# Patient Record
Sex: Male | Born: 1955 | Race: White | Hispanic: No | Marital: Single | Smoking: Never smoker
Health system: Southern US, Community
[De-identification: ages and names within clinical notes are randomized; demographics above are authoritative.]

## PROBLEM LIST (undated history)

## (undated) DIAGNOSIS — I10 Essential (primary) hypertension: Secondary | ICD-10-CM

## (undated) DIAGNOSIS — I514 Myocarditis, unspecified: Secondary | ICD-10-CM

## (undated) DIAGNOSIS — E119 Type 2 diabetes mellitus without complications: Secondary | ICD-10-CM

## (undated) DIAGNOSIS — J45909 Unspecified asthma, uncomplicated: Secondary | ICD-10-CM

## (undated) HISTORY — DX: Essential (primary) hypertension: I10

## (undated) HISTORY — DX: Unspecified asthma, uncomplicated: J45.909

---

## 2009-01-29 ENCOUNTER — Emergency Department (HOSPITAL_COMMUNITY): Admission: EM | Admit: 2009-01-29 | Discharge: 2009-01-29 | Payer: Self-pay | Admitting: Emergency Medicine

## 2013-01-13 ENCOUNTER — Ambulatory Visit (INDEPENDENT_AMBULATORY_CARE_PROVIDER_SITE_OTHER): Payer: BC Managed Care – PPO | Admitting: Family Medicine

## 2013-01-13 VITALS — BP 166/88 | HR 57 | Temp 98.1°F | Resp 18 | Ht 72.25 in | Wt 267.0 lb

## 2013-01-13 DIAGNOSIS — R040 Epistaxis: Secondary | ICD-10-CM

## 2013-01-13 DIAGNOSIS — Z91148 Patient's other noncompliance with medication regimen for other reason: Secondary | ICD-10-CM

## 2013-01-13 DIAGNOSIS — Z9114 Patient's other noncompliance with medication regimen: Secondary | ICD-10-CM

## 2013-01-13 DIAGNOSIS — I10 Essential (primary) hypertension: Secondary | ICD-10-CM

## 2013-01-13 MED ORDER — AMLODIPINE BESYLATE 10 MG PO TABS: 10.0000 mg | ORAL_TABLET | Freq: Every day | ORAL | Status: DC

## 2013-01-13 NOTE — Patient Instructions (Addendum)
Make an appt for a full physical with fasting blood work at your next visit.  DASH Diet The DASH diet stands for "Dietary Approaches to Stop Hypertension." It is a healthy eating plan that has been shown to reduce high blood pressure (hypertension) in as little as 14 days, while also possibly providing other significant health benefits. These other health benefits include reducing the risk of breast cancer after menopause and reducing the risk of type 2 diabetes, heart disease, colon cancer, and stroke. Health benefits also include weight loss and slowing kidney failure in patients with chronic kidney disease.  DIET GUIDELINES  Limit salt (sodium). Your diet should contain less than 1500 mg of sodium daily.  Limit refined or processed carbohydrates. Your diet should include mostly whole grains. Desserts and added sugars should be used sparingly.  Include small amounts of heart-healthy fats. These types of fats include nuts, oils, and tub margarine. Limit saturated and trans fats. These fats have been shown to be harmful in the body. CHOOSING FOODS  The following food groups are based on a 2000 calorie diet. See your Registered Dietitian for individual calorie needs. Grains and Grain Products (6 to 8 servings daily)  Eat More Often: Whole-wheat bread, brown rice, whole-grain or wheat pasta, quinoa, popcorn without added fat or salt (air popped).  Eat Less Often: White bread, white pasta, white rice, cornbread. Vegetables (4 to 5 servings daily)  Eat More Often: Fresh, frozen, and canned vegetables. Vegetables may be raw, steamed, roasted, or grilled with a minimal amount of fat.  Eat Less Often/Avoid: Creamed or fried vegetables. Vegetables in a cheese sauce. Fruit (4 to 5 servings daily)  Eat More Often: All fresh, canned (in natural juice), or frozen fruits. Dried fruits without added sugar. One hundred percent fruit juice ( cup [237 mL] daily).  Eat Less Often: Dried fruits with added  sugar. Canned fruit in light or heavy syrup. [employer], Fish, and Poultry (2 servings or less daily. One serving is 3 to 4 oz [85-114 g]).  Eat More Often: Ninety percent or leaner ground beef, tenderloin, sirloin. Round cuts of beef, chicken breast, [place] breast. All fish. Grill, bake, or broil your meat. Nothing should be fried.  Eat Less Often/Avoid: Fatty cuts of meat, [place], or chicken leg, thigh, or wing. Fried cuts of meat or fish. Dairy (2 to 3 servings)  Eat More Often: Low-fat or fat-free milk, low-fat plain or light yogurt, reduced-fat or part-skim cheese.  Eat Less Often/Avoid: Milk (whole, 2%).Whole milk yogurt. Full-fat cheeses. Nuts, Seeds, and Legumes (4 to 5 servings per week)  Eat More Often: All without added salt.  Eat Less Often/Avoid: Salted nuts and seeds, canned beans with added salt. Fats and Sweets (limited)  Eat More Often: Vegetable oils, tub margarines without trans fats, sugar-free gelatin. Mayonnaise and salad dressings.  Eat Less Often/Avoid: Coconut oils, palm oils, butter, stick margarine, cream, half and half, cookies, candy, pie. FOR MORE INFORMATION The Dash Diet Eating Plan: www.dashdiet.org Document Released: 09/13/2011 Document Revised: 12/17/2011 Document Reviewed: 09/13/2011 [facility] Patient Information 2013 [place], [place].

## 2013-01-13 NOTE — Progress Notes (Signed)
Subjective:    Patient ID: Daniel [name], male    DOB: December 09, 1955, 57 y.o.   MRN: [id number] Chief Complaint  Patient presents with  . Medication Refill  . Epistaxis    HPI  Has not been on his bp med for several years. Would only use it if he got nose bleeds and hasn't gotten in a long time so stopped using it.  Also, he states his rxs always expire after a year so he assumed we didn't need to keep him on the meds or we would have kept refilling them. Does not check bp outside of office.  Does not remember having labs done at all.  Not watching diet or getting exercise. Works 2nd shift - from 5 p.m. to 3 a.m. So last ate about 5 a.m. - 7 hrs ago.  He is in a hurry and wants to leave clinic asap so he can go home and gets some sleep before he has to go back to work. Nose bleed out of his right nare woke him from sleep this a.m. So he figured he better get back on his bp meds. This has been his only sx.  It stopped easily with pressure and has not recurred.  Past Medical History  Diagnosis Date  . Asthma    No current outpatient prescriptions on file prior to visit.   No current facility-administered medications on file prior to visit.   No Known Allergies   Review of Systems  Constitutional: Negative for fever and chills.  HENT: Positive for nosebleeds. Negative for congestion and rhinorrhea.   Eyes: Negative for visual disturbance.  Respiratory: Negative for shortness of breath.   Cardiovascular: Negative for chest pain and leg swelling.  Neurological: Negative for dizziness, syncope, facial asymmetry, weakness, light-headedness and headaches.      BP 166/88  Pulse 57  Temp(Src) 98.1 F (36.7 C) (Oral)  Resp 18  Ht 6' 0.25" (1.835 m)  Wt 267 lb (121.11 kg)  BMI 35.97 kg/m2  SpO2 97% Objective:   Physical Exam  Constitutional: He is oriented to person, place, and time. He appears well-developed and well-nourished. No distress.  HENT:  Head: Normocephalic and atraumatic.   Nose: No mucosal edema, rhinorrhea, nose lacerations, sinus tenderness, nasal deformity, septal deviation or nasal septal hematoma.  Right nare crusted with old dried blood externally but no source identified on exam  Eyes: Conjunctivae are normal. Pupils are equal, round, and reactive to light. No scleral icterus.  Neck: Normal range of motion. Neck supple. No thyromegaly present.  Cardiovascular: Normal rate, regular rhythm, normal heart sounds and intact distal pulses.   Pulmonary/Chest: Effort normal and breath sounds normal. No respiratory distress.  Musculoskeletal: He exhibits no edema.  Lymphadenopathy:    He has no cervical adenopathy.  Neurological: He is alert and oriented to person, place, and time.  Skin: Skin is warm and dry. He is not diaphoretic.  Psychiatric: His affect is blunt.      Assessment & Plan:  Essential hypertension, benign - Tried to explain to pt the importance of BP control and medication compliance even when assymptomatic - to prevent development of end-organ failure. Also encouraged pt to obtain labs today such as cmp and lipid panel to monitor kidneys, electrolytes, risk factors, etc but pt declined - does not like needles. I did not want to rx acei or diuretic to pt w/o baseline labs or lab monitoring so pt elected to change bp med to one that did not require lab  monitoring.  Noncompliance with medication regimen - pt encouraged to RTC for recheck in several wks and tried to explain why we need to see him yearly for bp med refills.  Rec CPE at next visit.  Epistaxis - resolved.  Meds ordered this encounter  Medications  . DISCONTD: lisinopril-hydrochlorothiazide (PRINZIDE,ZESTORETIC) 10-12.5 MG per tablet    Sig: Take 1 tablet by mouth daily.  Marland Kitchen amLODipine (NORVASC) 10 MG tablet    Sig: Take 1 tablet (10 mg total) by mouth daily.    Dispense:  90 tablet    Refill:  3

## 2013-01-15 ENCOUNTER — Encounter (HOSPITAL_COMMUNITY): Payer: Self-pay | Admitting: Family Medicine

## 2013-01-15 ENCOUNTER — Emergency Department (HOSPITAL_COMMUNITY)
Admission: EM | Admit: 2013-01-15 | Discharge: 2013-01-15 | Disposition: A | Discharge status: Home / Self Care | Payer: BC Managed Care – PPO | Attending: Emergency Medicine | Admitting: Emergency Medicine

## 2013-01-15 DIAGNOSIS — Z79899 Other long term (current) drug therapy: Secondary | ICD-10-CM | POA: Insufficient documentation

## 2013-01-15 DIAGNOSIS — J45909 Unspecified asthma, uncomplicated: Secondary | ICD-10-CM | POA: Insufficient documentation

## 2013-01-15 DIAGNOSIS — I10 Essential (primary) hypertension: Secondary | ICD-10-CM | POA: Insufficient documentation

## 2013-01-15 DIAGNOSIS — R040 Epistaxis: Secondary | ICD-10-CM | POA: Insufficient documentation

## 2013-01-15 NOTE — ED Provider Notes (Signed)
History     CSN: [id number]  Arrival date & time 01/15/13  4098   First MD Initiated Contact with Patient 01/15/13 [phone]      Chief Complaint  Patient presents with  . Epistaxis   HPI  History provided by the patient. Patient is a 57 year old male with history of asthma and hypertension who presents for concerns for recurrent right nosebleed. Patient reports being at work for the night shift and around 11 PM developed slight right nosebleed. He felt the bleeding was from the front of his nose and he applied pressure for several minutes stopping the bleeding. 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 He can help pressure and put a small piece of tissue in his nose. Bleeding again was controlled the patient was concerned because he was recently started on blood pressure medications 3 days ago. He has been taking this as prescribed for 2 doses. He has not had any other symptoms or side effects. Denies any runny nose or congestion. No sneezing or coughing. No other aggravating or alleviating factors. No other associated symptoms.    Past Medical History  Diagnosis Date  . Asthma     History reviewed. No pertinent past surgical history.  Family History  Problem Relation Age of Onset  . Cancer Mother   . Heart attack Father   . Cancer Paternal Aunt     History  Substance Use Topics  . Smoking status: Never Smoker   . Smokeless tobacco: Not on file  . Alcohol Use: No      Review of Systems  HENT: Negative for congestion, rhinorrhea and sneezing.   Respiratory: Negative for cough.   All other systems reviewed and are negative.    Allergies  Review of patient's allergies indicates no known allergies.  Home Medications   Current Outpatient Rx  Name  Route  Sig  Dispense  Refill  . amLODipine (NORVASC) 10 MG tablet   Oral   Take 1 tablet (10 mg total) by mouth daily.   90 tablet   3     BP 146/86  Pulse 67   Temp(Src) 98.1 F (36.7 C) (Oral)  Resp 18  SpO2 100%  Physical Exam  Nursing note and vitals reviewed. Constitutional: He is oriented to person, place, and time. He appears well-developed and well-nourished. No distress.  HENT:  Head: Normocephalic.  Small area of dry blood and clot to the very anterior right nasal septum. Bleeding controlled.  Eyes: Conjunctivae are normal.  Cardiovascular: Normal rate and regular rhythm.   Pulmonary/Chest: Effort normal and breath sounds normal.  Musculoskeletal: Normal range of motion.  Neurological: He is alert and oriented to person, place, and time.  Skin: Skin is warm.  Psychiatric: He has a normal mood and affect. His behavior is normal.    ED Course  Procedures    1. Epistaxis       MDM  4:50 AM patient seen and evaluated. Patient appears well. No active nosebleed.        Angus Seller, PA-C 01/15/13 [phone]

## 2013-01-15 NOTE — ED Notes (Signed)
Patient states he had a nosebleed on Tuesday morning and was seen at [facility] and was given medication for is blood pressure. Tonight patient had a nosebleed at 2300 and at 1am. No active bleeding at this time; patient has tissue inserted into right nare.

## 2013-01-15 NOTE — ED Provider Notes (Signed)
Medical screening examination/treatment/procedure(s) were performed by non-physician practitioner and as supervising physician I was immediately available for consultation/collaboration.  John-Adam Sherissa Tenenbaum, M.D.   John-Adam Jakobi Thetford, MD 01/15/13 0752 

## 2013-01-15 NOTE — ED Notes (Signed)
PA at bedside.

## 2013-01-16 ENCOUNTER — Ambulatory Visit (INDEPENDENT_AMBULATORY_CARE_PROVIDER_SITE_OTHER): Payer: BC Managed Care – PPO | Admitting: Family Medicine

## 2013-01-16 VITALS — BP 142/84 | HR 80 | Temp 98.9°F | Resp 17 | Ht 71.0 in | Wt 266.0 lb

## 2013-01-16 DIAGNOSIS — R040 Epistaxis: Secondary | ICD-10-CM

## 2013-01-16 NOTE — Progress Notes (Signed)
  [facility]:  Office Visit  Chief Complaint:  Chief Complaint  Patient presents with  . Epistaxis    HPI: Daniel [name] is a 57 y.o. male who complains of  Recurrent epistaxis. First nose bleed was in bed asleep. 2nd and third time he was working on a [employer]. He had one earlier today but each time it is resolved by the timehe gets into the clinic. He denies any dizziness. No clot formation. No NSAID use. No blood thinners. He has had no fevers or chills. He has tried holding his nose on one side to stop the bleeding and putting a tissue in his nose.   Past Medical History  Diagnosis Date  . Asthma    History reviewed. No pertinent past surgical history. History   Social History  . Marital Status: Unknown    Spouse Name: N/A    Number of Children: N/A  . Years of Education: N/A   Social History Main Topics  . Smoking status: Never Smoker   . Smokeless tobacco: None  . Alcohol Use: No  . Drug Use: No  . Sexually Active: No   Other Topics Concern  . None   Social History Narrative  . None   Family History  Problem Relation Age of Onset  . Cancer Mother   . Heart attack Father   . Cancer Paternal Aunt    No Known Allergies Prior to Admission medications   Medication Sig Start Date End Date Taking? Authorizing Provider  amLODipine (NORVASC) 10 MG tablet Take 1 tablet (10 mg total) by mouth daily. 01/13/13  Yes Sherren Mocha, MD     ROS: The patient denies fevers, chills, night sweats, unintentional weight loss, chest pain, palpitations, wheezing, dyspnea on exertion, nausea, vomiting, abdominal pain, dysuria, hematuria, melena, numbness, weakness, or tingling.   All other systems have been reviewed and were otherwise negative with the exception of those mentioned in the HPI and as above.    PHYSICAL EXAM: Filed Vitals:   01/16/13 1744  BP: 142/84  Pulse: 80  Temp: 98.9 F (37.2 C)  Resp: 17   Filed Vitals:   01/16/13 1744  Height: 5\' 11"   (1.803 m)  Weight: 266 lb (120.657 kg)   Body mass index is 37.12 kg/(m^2).  General: Alert, no acute distress HEENT:  Normocephalic, atraumatic, oropharynx patent. No active bleeding, some broken capillaries on left nasal mucosa, greater in left  than right Cardiovascular:  Regular rate and rhythm, no rubs murmurs or gallops.  No Carotid bruits, radial pulse intact. No pedal edema.  Respiratory: Clear to auscultation bilaterally.  No wheezes, rales, or rhonchi.  No cyanosis, no use of accessory musculature GI: No organomegaly, abdomen is soft and non-tender, positive bowel sounds.  No masses. Skin: No rashes. Neurologic: Facial musculature symmetric. Psychiatric: Patient is appropriate throughout our interaction. Lymphatic: No cervical lymphadenopathy Musculoskeletal: Gait intact.   LABS: No results found for this or any previous visit.   EKG/XRAY:   Primary read interpreted by Dr. Conley Rolls at [facility].   ASSESSMENT/PLAN: Encounter Diagnosis  Name Primary?  . Epistaxis Yes   No active bleeding, some broken capillaries on left nasal mucosa, greater in left  than right Advise on how to pinch his nose nad also to put cold compresses on it.  Vasoline in nasal mucosa to keep moisture and prevent scabbing F/u prn    Tauheedah Bok PHUONG, DO 01/16/2013 6:28 PM

## 2013-07-05 ENCOUNTER — Encounter (HOSPITAL_COMMUNITY): Payer: Self-pay | Admitting: *Deleted

## 2013-07-05 ENCOUNTER — Emergency Department (HOSPITAL_COMMUNITY)
Admission: EM | Admit: 2013-07-05 | Discharge: 2013-07-06 | Disposition: A | Discharge status: Home / Self Care | Payer: BC Managed Care – PPO | Attending: Emergency Medicine | Admitting: Emergency Medicine

## 2013-07-05 DIAGNOSIS — Z79899 Other long term (current) drug therapy: Secondary | ICD-10-CM | POA: Insufficient documentation

## 2013-07-05 DIAGNOSIS — J45909 Unspecified asthma, uncomplicated: Secondary | ICD-10-CM | POA: Insufficient documentation

## 2013-07-05 DIAGNOSIS — R040 Epistaxis: Secondary | ICD-10-CM | POA: Insufficient documentation

## 2013-07-05 MED ORDER — OXYMETAZOLINE HCL 0.05 % NA SOLN
2.0000 | Freq: Once | NASAL | Status: AC
  Administered 2013-07-06: 2 via NASAL
  Filled 2013-07-05: qty 15

## 2013-07-05 NOTE — ED Notes (Signed)
PTA inset of nosebleed left nare bleeding, controlled with tissue packed in both nares

## 2013-07-05 NOTE — ED Provider Notes (Signed)
CSN: [id number]     Arrival date & time 07/05/13  2241 History   First MD Initiated Contact with Patient 07/05/13 2325     Chief Complaint  Patient presents with  . Epistaxis   (Consider location/radiation/quality/duration/timing/severity/associated sxs/prior Treatment) HPI  57 year old male who is currently not on blood thinner medication presents for evaluations of nosebleed. Patient states he has a history of recurrent nosebleed secondary to "picking my nose".  His nosebleed he usually lasting less than an hour. However the most recent episode today which started about 3 hours ago and has been persistent however improving. Bleeding is nose nares, with postnasal drip, without any significant pain. He has been applying pressure which has helped. 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 Denies any recent sickness. Denies any recent trauma.  Past Medical History  Diagnosis Date  . Asthma    History reviewed. No pertinent past surgical history. Family History  Problem Relation Age of Onset  . Cancer Mother   . Heart attack Father   . Cancer Paternal Aunt    History  Substance Use Topics  . Smoking status: Never Smoker   . Smokeless tobacco: Not on file  . Alcohol Use: No    Review of Systems  Constitutional: Negative for fever.  HENT: Positive for nosebleeds.   Skin: Negative for rash.  Neurological: Negative for headaches.    Allergies  Review of patient's allergies indicates no known allergies.  Home Medications   Current Outpatient Rx  Name  Route  Sig  Dispense  Refill  . amLODipine (NORVASC) 10 MG tablet   Oral   Take 1 tablet (10 mg total) by mouth daily.   90 tablet   3    BP 160/90  Pulse 77  Temp(Src) 97.6 F (36.4 C) (Oral)  Resp 20  Ht 6' (1.829 m)  Wt 280 lb (127.007 kg)  BMI 37.97 kg/m2  SpO2 98% Physical Exam  Nursing note and vitals  reviewed. Constitutional: He appears well-developed and well-nourished.  HENT:  Head: Normocephalic and atraumatic.  Nose: No sinus tenderness. Epistaxis is observed.    Neurological: He is alert.  Skin: No rash noted.    ED Course  EPISTAXIS MANAGEMENT Date/Time: 07/06/2013 1:34 AM Performed by: Fayrene Helper Authorized by: Fayrene Helper Consent: Verbal consent obtained. Risks and benefits: risks, benefits and alternatives were discussed Consent given by: patient Patient understanding: patient states understanding of the procedure being performed Test results: test results available and properly labeled Patient identity confirmed: verbally with patient, hospital-assigned identification number and arm band Time out: Immediately prior to procedure a "time out" was called to verify the correct patient, procedure, equipment, support staff and site/side marked as required. Local anesthetic: LET (lido,epi,tetracaine) Anesthetic total: 5 ml Patient sedated: no Treatment site: left anterior and right Kiesselbach's area Repair method: silver nitrate and anterior pack Post-procedure assessment: bleeding stopped Treatment complexity: simple Recurrence: recurrence of recent bleed Patient tolerance: Patient tolerated the procedure well with no immediate complications.   (including critical care time)  12:38 AM Anterior nose bleed to L nares, currently not actively bleeding.  Will monitor.    12:48 AM Will give pt ENT referral, care instruction provided.  Return precaution discussed.  Care discussed with attending.    1:30 AM Nose rebleed, source of bleed is to L nare, anteriorly.  Silver nitrate tried without success.  Rapid Rhino  4.5cm applied with success.  Pt d/c with abx and referral to ENT for further care.  Pt agrees with plan.      Labs Review Labs Reviewed - No data to display Imaging Review No results found.  MDM   1. Anterior epistaxis    BP 160/90  Pulse 77   Temp(Src) 97.6 F (36.4 C) (Oral)  Resp 20  Ht 6' (1.829 m)  Wt 280 lb (127.007 kg)  BMI 37.97 kg/m2  SpO2 98%     Fayrene Helper, PA-C 07/06/13 0135

## 2013-07-06 ENCOUNTER — Encounter: Payer: Self-pay | Admitting: Family Medicine

## 2013-07-06 ENCOUNTER — Ambulatory Visit (INDEPENDENT_AMBULATORY_CARE_PROVIDER_SITE_OTHER): Payer: BC Managed Care – PPO | Admitting: Family Medicine

## 2013-07-06 VITALS — BP 166/102 | HR 79 | Temp 99.0°F | Resp 18 | Wt 279.0 lb

## 2013-07-06 DIAGNOSIS — I10 Essential (primary) hypertension: Secondary | ICD-10-CM

## 2013-07-06 DIAGNOSIS — R040 Epistaxis: Secondary | ICD-10-CM

## 2013-07-06 MED ORDER — AMLODIPINE BESYLATE 10 MG PO TABS: 10.0000 mg | ORAL_TABLET | Freq: Every day | ORAL | Status: DC

## 2013-07-06 MED ORDER — LIDOCAINE-EPINEPHRINE-TETRACAINE (LET) SOLUTION
3.0000 mL | Freq: Once | NASAL | Status: AC
  Administered 2013-07-06: 3 mL via TOPICAL
  Filled 2013-07-06: qty 3

## 2013-07-06 MED ORDER — AMOXICILLIN 500 MG PO CAPS: 500.0000 mg | ORAL_CAPSULE | Freq: Three times a day (TID) | ORAL | Status: DC

## 2013-07-06 MED ORDER — HYDROCODONE-ACETAMINOPHEN 5-325 MG PO TABS: 1.0000 | ORAL_TABLET | ORAL | Status: DC | PRN

## 2013-07-06 NOTE — Patient Instructions (Signed)
Do not blow your nose for the time being.  Contact us if symptoms worsen.

## 2013-07-06 NOTE — ED Provider Notes (Signed)
Medical screening examination/treatment/procedure(s) were performed by non-physician practitioner and as supervising physician I was immediately available for consultation/collaboration.  Marcelia Petersen M Asa Baudoin, MD 07/06/13 0446 

## 2013-07-06 NOTE — Progress Notes (Signed)
  Subjective:    Patient ID: Daniel [name], male    DOB: 1955-11-08, 57 y.o.   MRN: [id number]  HPI  57 YO male patient with a history of nose bleeds comes in today with a Nasastat in his left nostril. Patient had a nose bleed Saturday night. His nose started again with minimal bleeding twice Sunday during the day. On Sunday evening the nose starting bleeding worse. The patient packed his nose with gauze, vasoline and cotton. The blood was draining down his throat. He went to the [facility] ER. When they pulled out the packing he had stopped bleeding.   The ER said to use some Afrin to close it up. The Dr caused the nose to gush blood while trying to insert a different kind of nitrate stick. Patient reports that he had not had that type used before. The Dr used a Nasastat to stop the bleeding.   Pt reports he has not been able to sleep, he is in so much pain.   Patient works in a [employer]. The doors are usually open to allow air flow through the building. Patient does not feel that it is dry.   Patient is also here to get a refill of his Norvasc 10mg . He has been out of his medication for 3 days.  Review of Systems Elevated blood pressure in ED    Objective:   Physical Exam NAD Left nostril has nasal tampon.  Removed revealing cautery changes lower 1/2 nostril leaving the Kiesel bach plexus bare. Area cauterized. No bleeding     Assessment & Plan:  Refill Norvasc 10mg  #90 3 refills.  Advised to be gentle with nose and avoid blowing it or sneezing.  Mosie Epstein, MD

## 2013-10-10 ENCOUNTER — Ambulatory Visit (INDEPENDENT_AMBULATORY_CARE_PROVIDER_SITE_OTHER): Payer: 59 | Admitting: Physician Assistant

## 2013-10-10 VITALS — BP 162/84 | HR 86 | Temp 98.2°F | Resp 18 | Ht 71.5 in | Wt 278.8 lb

## 2013-10-10 DIAGNOSIS — I10 Essential (primary) hypertension: Secondary | ICD-10-CM

## 2013-10-10 MED ORDER — AMLODIPINE BESYLATE 10 MG PO TABS: 10.0000 mg | ORAL_TABLET | Freq: Every day | ORAL | Status: DC

## 2013-10-10 NOTE — Progress Notes (Signed)
   Subjective:    Patient ID: Daniel [name], male    DOB: 11-22-1955, 58 y.o.   MRN: [id number]  HPI 58 year old male presents for refill of norvasc.  He has been taking it for "years" and is tolerating it just fine.  He has been fairly non-compliant for follow up and does not have a PCP here.  Has refused labwork in the past due to adverse feeling about needles.  Does have hx of epistaxis but has not had an episode for several months.  Denies chest pain, SOB, headache, dizziness, palpitations, nausea, or vomiting.  Admits he does not get any cardiovascular exercise.  Blood pressure slightly elevated today, but he has been out for 2 days.  Patient is otherwise doing well with no other concerns today.     Review of Systems  Constitutional: Negative for fever and chills.  Eyes: Negative for visual disturbance.  Respiratory: Negative for shortness of breath.   Cardiovascular: Negative for chest pain and palpitations.  Gastrointestinal: Negative for nausea and vomiting.  Neurological: Negative for dizziness and headaches.       Objective:   Physical Exam  Constitutional: He is oriented to person, place, and time. He appears well-developed and well-nourished.  HENT:  Head: Normocephalic and atraumatic.  Right Ear: External ear normal.  Left Ear: External ear normal.  Eyes: Conjunctivae and EOM are normal.  Neck: Normal range of motion. Neck supple.  Cardiovascular: Normal rate, regular rhythm and normal heart sounds.   Pulmonary/Chest: Effort normal and breath sounds normal.  Neurological: He is alert and oriented to person, place, and time.  Psychiatric: He has a normal mood and affect. His behavior is normal. Judgment and thought content normal.    Patient refused labwork today.       Assessment & Plan:   Essential hypertension, benign  Continue Norvasc 10 mg daily Recommend at home blood pressure check.   Likely elevated today due to missed doses, but need to recheck in the  next 3-6 months.  Strongly recommend CPE with labwork. Patient reports he will consider this.

## 2013-10-10 NOTE — Addendum Note (Signed)
Addended by: Nelva Nay on: 10/10/2013 08:14 PM   Modules accepted: Level of Service

## 2013-10-14 ENCOUNTER — Telehealth: Payer: Self-pay | Admitting: Family Medicine

## 2013-10-14 NOTE — Telephone Encounter (Signed)
Message copied by Tilman Neat on Wed Oct 14, 2013  8:27 AM ------      Message from: Nelva Nay      Created: Sat Oct 10, 2013  8:14 PM       Please schedule CPE with Dr. Clelia Croft ------

## 2013-10-14 NOTE — Telephone Encounter (Signed)
Voicemail not setup. Could not leave message

## 2013-10-19 NOTE — Progress Notes (Signed)
No answer and no voice mail.

## 2014-09-20 ENCOUNTER — Ambulatory Visit (INDEPENDENT_AMBULATORY_CARE_PROVIDER_SITE_OTHER): Payer: BC Managed Care – PPO | Admitting: Internal Medicine

## 2014-09-20 VITALS — BP 130/80 | HR 75 | Temp 98.3°F | Resp 16 | Ht 71.0 in | Wt 258.0 lb

## 2014-09-20 DIAGNOSIS — R1030 Lower abdominal pain, unspecified: Secondary | ICD-10-CM

## 2014-09-20 DIAGNOSIS — K4090 Unilateral inguinal hernia, without obstruction or gangrene, not specified as recurrent: Secondary | ICD-10-CM

## 2014-09-20 DIAGNOSIS — R109 Unspecified abdominal pain: Secondary | ICD-10-CM

## 2014-09-20 DIAGNOSIS — R81 Glycosuria: Secondary | ICD-10-CM

## 2014-09-20 LAB — POCT UA - MICROSCOPIC ONLY
BACTERIA, U MICROSCOPIC: NEGATIVE
CRYSTALS, UR, HPF, POC: NEGATIVE
Casts, Ur, LPF, POC: NEGATIVE
Yeast, UA: NEGATIVE

## 2014-09-20 LAB — POCT URINALYSIS DIPSTICK
Bilirubin, UA: NEGATIVE
Glucose, UA: 100
Ketones, UA: 80
LEUKOCYTES UA: NEGATIVE
Nitrite, UA: NEGATIVE
PROTEIN UA: 30
Spec Grav, UA: 1.02
Urobilinogen, UA: 1
pH, UA: 7.5

## 2014-09-20 LAB — IFOBT (OCCULT BLOOD): IFOBT: NEGATIVE

## 2014-09-20 NOTE — Progress Notes (Signed)
   Subjective:    Patient ID: Daniel [name], male    DOB: [birth year], 58 y.o.   MRN: [id number]  HPI 1-2 months vague pain, worse recently left side and mid. No nausea, vomiting, fever. Usually stools loose and soft , for last 24 hrs not able to have BM. He thinks he has a hernia on right side from lifting. No anorexia but has 15 lb weight loss. Cause unclear No bleeding seen.   Review of Systems     Objective:   Physical Exam  Constitutional: He is oriented to person, place, and time. He appears well-nourished. No distress.  HENT:  Head: Normocephalic.  Mouth/Throat: Oropharynx is clear and moist.  Eyes: EOM are normal. No scleral icterus.  Neck: Normal range of motion.  Cardiovascular: Normal rate, regular rhythm and normal heart sounds.   Pulmonary/Chest: Effort normal and breath sounds normal.  Abdominal: Soft. Normal appearance and bowel sounds are normal. He exhibits no distension, no abdominal bruit, no ascites and no mass. There is no splenomegaly or hepatomegaly. There is tenderness in the periumbilical area, suprapubic area and left upper quadrant. There is CVA tenderness. There is no rigidity and no guarding. A hernia is present. Hernia confirmed positive in the right inguinal area. Hernia confirmed negative in the ventral area and confirmed negative in the left inguinal area.    cva left tender  Genitourinary: Rectum normal, prostate normal and penis normal.  Musculoskeletal: Normal range of motion.  Neurological: He is alert and oriented to person, place, and time. He exhibits normal muscle tone. Coordination normal.  Skin: No rash noted.  Psychiatric: He has a normal mood and affect. His behavior is normal. Judgment and thought content normal.   Cmet,cbc,lipase,psa,lipids,tsh,glucose,a1c rec and refused  Results for orders placed or performed in visit on 09/20/14  IFOBT POC (occult bld, rslt in office)  Result Value Ref Range   IFOBT Negative   POCT UA -  Microscopic Only  Result Value Ref Range   WBC, Ur, HPF, POC 0-3    RBC, urine, microscopic 0-2    Bacteria, U Microscopic neg    Mucus, UA trace    Epithelial cells, urine per micros 0-2    Crystals, Ur, HPF, POC neg    Casts, Ur, LPF, POC neg    Yeast, UA neg   POCT urinalysis dipstick  Result Value Ref Range   Color, UA yellow    Clarity, UA clear    Glucose, UA 100    Bilirubin, UA neg    Ketones, UA 80    Spec Grav, UA 1.020    Blood, UA trace-intact    pH, UA 7.5    Protein, UA 30    Urobilinogen, UA 1.0    Nitrite, UA neg    Leukocytes, UA Negative    fearfull of blood draw  1+ glycosurea     Assessment & Plan:  Bloating/Abdominal pain chronic/Obesity/trial miralax Refer to GI for eval and colonoscopy Large right inguinal hernia Refer to GS  Agreed to consider sedation for blood draw Will return when ready

## 2014-09-20 NOTE — Patient Instructions (Signed)
Constipation  Constipation is when a person has fewer than three bowel movements a week, has difficulty having a bowel movement, or has stools that are dry, hard, or larger than normal. As people grow older, constipation is more common. If you try to fix constipation with medicines that make you have a bowel movement (laxatives), the problem may get worse. Long-term laxative use may cause the muscles of the colon to become weak. A low-fiber diet, not taking in enough fluids, and taking certain medicines may make constipation worse.   CAUSES    Certain medicines, such as antidepressants, pain medicine, iron supplements, antacids, and water pills.    Certain diseases, such as diabetes, irritable bowel syndrome (IBS), thyroid disease, or depression.    Not drinking enough water.    Not eating enough fiber-rich foods.    Stress or travel.    Lack of physical activity or exercise.    Ignoring the urge to have a bowel movement.    Using laxatives too much.   SIGNS AND SYMPTOMS    Having fewer than three bowel movements a week.    Straining to have a bowel movement.    Having stools that are hard, dry, or larger than normal.    Feeling full or bloated.    Pain in the lower abdomen.    Not feeling relief after having a bowel movement.   DIAGNOSIS   Your health care provider will take a medical history and perform a physical exam. Further testing may be done for severe constipation. Some tests may include:   A barium enema X-ray to examine your rectum, colon, and, sometimes, your small intestine.    A sigmoidoscopy to examine your lower colon.    A colonoscopy to examine your entire colon.  TREATMENT   Treatment will depend on the severity of your constipation and what is causing it. Some dietary treatments include drinking more fluids and eating more fiber-rich foods. Lifestyle treatments may include regular exercise. If these diet and lifestyle recommendations do not help, your health care  provider may recommend taking over-the-counter laxative medicines to help you have bowel movements. Prescription medicines may be prescribed if over-the-counter medicines do not work.   HOME CARE INSTRUCTIONS    Eat foods that have a lot of fiber, such as fruits, vegetables, whole grains, and beans.   Limit foods high in fat and processed sugars, such as french fries, hamburgers, cookies, candies, and soda.    A fiber supplement may be added to your diet if you cannot get enough fiber from foods.    Drink enough fluids to keep your urine clear or pale yellow.    Exercise regularly or as directed by your health care provider.    Go to the restroom when you have the urge to go. Do not hold it.    Only take over-the-counter or prescription medicines as directed by your health care provider. Do not take other medicines for constipation without talking to your health care provider first.   SEEK IMMEDIATE MEDICAL CARE IF:    You have bright red blood in your stool.    Your constipation lasts for more than 4 days or gets worse.    You have abdominal or rectal pain.    You have thin, pencil-like stools.    You have unexplained weight loss.  MAKE SURE YOU:    Understand these instructions.   Will watch your condition.   Will get help right away if you are not   doing well or get worse.  Document Released: 06/22/2004 Document Revised: 09/29/2013 Document Reviewed: 07/06/2013  ExitCare Patient Information 2015 [employer]. This information is not intended to replace advice given to you by your health care provider. Make sure you discuss any questions you have with your health care provider.    Bloating  Bloating is the feeling of fullness in your belly. You may feel as though your pants are too tight. Often the cause of bloating is overeating, retaining fluids, or having gas in your bowel. It is also caused by swallowing air and eating foods that cause gas. Irritable bowel syndrome is one of the most  common causes of bloating. Constipation is also a common cause. Sometimes more serious problems can cause bloating.  SYMPTOMS   Usually there is a feeling of fullness, as though your abdomen is bulged out. There may be mild discomfort.   DIAGNOSIS   Usually no particular testing is necessary for most bloating. If the condition persists and seems to become worse, your caregiver may do additional testing.   TREATMENT    There is no direct treatment for bloating.   Do not put gas into the bowel. Avoid chewing gum and sucking on candy. These tend to make you swallow air. Swallowing air can also be a nervous habit. Try to avoid this.   Avoiding high residue diets will help. Eat foods with soluble fibers (examples include root vegetables, apples, or barley) and substitute dairy products with soy and rice products. This helps irritable bowel syndrome.   If constipation is the cause, then a high residue diet with more fiber will help.   Avoid carbonated beverages.   Over-the-counter preparations are available that help reduce gas. Your pharmacist can help you with this.  SEEK MEDICAL CARE IF:    Bloating continues and seems to be getting worse.   You notice a weight gain.   You have a weight loss but the bloating is getting worse.   You have changes in your bowel habits or develop nausea or vomiting.  SEEK IMMEDIATE MEDICAL CARE IF:    You develop shortness of breath or swelling in your legs.   You have an increase in abdominal pain or develop chest pain.  Document Released: 07/25/2006 Document Revised: 12/17/2011 Document Reviewed: 09/12/2007  ExitCare Patient Information 2015 [employer]. This information is not intended to replace advice given to you by your health care provider. Make sure you discuss any questions you have with your health care provider.

## 2014-10-22 ENCOUNTER — Encounter: Payer: Self-pay | Admitting: Internal Medicine

## 2015-09-07 ENCOUNTER — Ambulatory Visit (INDEPENDENT_AMBULATORY_CARE_PROVIDER_SITE_OTHER): Payer: BLUE CROSS/BLUE SHIELD | Admitting: Family Medicine

## 2015-09-07 VITALS — BP 184/116 | HR 74 | Temp 98.4°F | Resp 18 | Ht 71.5 in | Wt 279.0 lb

## 2015-09-07 DIAGNOSIS — R040 Epistaxis: Secondary | ICD-10-CM

## 2015-09-07 DIAGNOSIS — R81 Glycosuria: Secondary | ICD-10-CM

## 2015-09-07 DIAGNOSIS — I10 Essential (primary) hypertension: Secondary | ICD-10-CM

## 2015-09-07 DIAGNOSIS — J302 Other seasonal allergic rhinitis: Secondary | ICD-10-CM

## 2015-09-07 LAB — POCT URINALYSIS DIP (MANUAL ENTRY)
BILIRUBIN UA: NEGATIVE
Blood, UA: NEGATIVE
Glucose, UA: 500 — AB
Ketones, POC UA: NEGATIVE
LEUKOCYTES UA: NEGATIVE
Nitrite, UA: NEGATIVE
Protein Ur, POC: NEGATIVE
Spec Grav, UA: 1.015
Urobilinogen, UA: 0.2
pH, UA: 6.5

## 2015-09-07 MED ORDER — AMLODIPINE BESYLATE 10 MG PO TABS: 10.0000 mg | ORAL_TABLET | Freq: Every day | ORAL | Status: DC

## 2015-09-07 NOTE — Progress Notes (Signed)
Subjective:    Patient ID: Daniel [name], male    DOB: [birth year], 59 y.o.   MRN: [id number]  09/07/2015  Epistaxis   HPI This 59 y.o. male presents for evaluation of epistaxis for past hour.  Onset of L nare epistaxis two weeks ago; recommended using Afrin regularly. Blood pressure also in 180s during that visit; no treatment prescribed; recommended evaluation by PCP.  Has continued to suffer with nosebleed from R nare since visit two weeks ago.  Has also suffered with nose bleeds intermittently through the years during the fall months.  Admits to chronic nasal congestion and rhinorrhea.  Has used Afrin for acute nose bleeds but does not take anything for chronic nasal congestion.  Previously prescribed Amlodipine for hypertension but never followed up for refills.  Does not check BP at home; denies headache, dizziness, focal weakness, paresthesias.  Denies CP/palp/SOB/leg swelling.  Refuses labs during visit today.  Denies other sites of bleeding or easy bruising.   Review of Systems  Constitutional: Negative for fever, chills, diaphoresis, activity change, appetite change and fatigue.  HENT: Positive for congestion, nosebleeds, postnasal drip and rhinorrhea. Negative for ear pain, facial swelling, hearing loss, mouth sores, sinus pressure, sneezing, sore throat, tinnitus, trouble swallowing and voice change.   Respiratory: Negative for cough and shortness of breath.   Cardiovascular: Negative for chest pain, palpitations and leg swelling.  Gastrointestinal: Negative for nausea, vomiting, abdominal pain and diarrhea.  Endocrine: Negative for cold intolerance, heat intolerance, polydipsia, polyphagia and polyuria.  Skin: Negative for color change, rash and wound.  Neurological: Negative for dizziness, tremors, seizures, syncope, facial asymmetry, speech difficulty, weakness, light-headedness, numbness and headaches.  Hematological: Negative for adenopathy. Does not bruise/bleed easily.    Psychiatric/Behavioral: Negative for sleep disturbance and dysphoric mood. The patient is not nervous/anxious.     Past Medical History  Diagnosis Date  . Asthma   . Hypertension    History reviewed. No pertinent past surgical history. No Known Allergies  Social History   Social History  . Marital Status: Unknown    Spouse Name: N/A  . Number of Children: N/A  . Years of Education: N/A   Occupational History  . Not on file.   Social History Main Topics  . Smoking status: Never Smoker   . Smokeless tobacco: Not on file  . Alcohol Use: No  . Drug Use: No  . Sexual Activity: No   Other Topics Concern  . Not on file   Social History Narrative   Marital status: single; not dating      Children: none      Lives: alone      Employment:  Maintenance/production      Tobacco:  None;       Alcohol: none      Drugs: none        Family History  Problem Relation Age of Onset  . Cancer Mother     unknown primary  . Heart attack Father   . Heart disease Father 83    [facility]  . Cancer Paternal Aunt        Objective:    BP 184/116 mmHg  Pulse 74  Temp(Src) 98.4 F (36.9 C) (Oral)  Resp 18  Ht 5' 11.5" (1.816 m)  Wt 279 lb (126.554 kg)  BMI 38.37 kg/m2  SpO2 98% Physical Exam  Constitutional: He is oriented to person, place, and time. He appears well-developed and well-nourished. No distress.  HENT:  Head: Normocephalic and atraumatic.  Right Ear: Tympanic membrane, external ear and ear canal normal.  Left Ear: Tympanic membrane, external ear and ear canal normal.  Nose: Mucosal edema and rhinorrhea present. No nose lacerations, sinus tenderness, nasal deformity, septal deviation or nasal septal hematoma. Epistaxis is observed.  No foreign bodies.  Mouth/Throat: Oropharynx is clear and moist.  L nare with residual blood; no active bleeding from L nare; no visualized lesion.  L nare packed with vaseline gauze.   Eyes: Conjunctivae and EOM are normal. Pupils are  equal, round, and reactive to light.  Neck: Normal range of motion. Neck supple. Carotid bruit is not present. No thyromegaly present.  Cardiovascular: Normal rate, regular rhythm, normal heart sounds and intact distal pulses.  Exam reveals no gallop and no friction rub.   No murmur heard. Pulmonary/Chest: Effort normal and breath sounds normal. He has no wheezes. He has no rales.  Abdominal: Soft. Bowel sounds are normal. He exhibits no distension and no mass. There is no tenderness. There is no rebound and no guarding.  Lymphadenopathy:    He has no cervical adenopathy.  Neurological: He is alert and oriented to person, place, and time. No cranial nerve deficit.  Skin: Skin is warm and dry. No rash noted. He is not diaphoretic.  Psychiatric: He has a normal mood and affect. His behavior is normal.  Nursing note and vitals reviewed.  Results for orders placed or performed in visit on 09/07/15  POCT urinalysis dipstick  Result Value Ref Range   Color, UA yellow yellow   Clarity, UA clear clear   Glucose, UA =500 (A) negative   Bilirubin, UA negative negative   Ketones, POC UA negative negative   Spec Grav, UA 1.015    Blood, UA negative negative   pH, UA 6.5    Protein Ur, POC negative negative   Urobilinogen, UA 0.2    Nitrite, UA Negative Negative   Leukocytes, UA Negative Negative       Assessment & Plan:   1. Epistaxis   2. Essential hypertension, benign   3. Glucosuria     1. L nare epistaxis:  New/recurrent; pt refused labs during visit.  No active bleeding from nare during visit; L nare packed with vaseline gauze and advised to remain in place for next 24 hours. Recommend nasal saline bid for the next two weeks and then once at nighttime.  Recommend Flonase every morning. Use Afrin for acute nosebleed. 2.  HTN: uncontrolled; restart Amlodipine 10mg  daily; stable urine; pt refused labs. 3. Glucosuria: New; reviewed after patient discharged from office; will obtain  glucose, HgbA1c at follow-up visit. 4.  Allergic Rhinitis: uncontrolled; recommend Flonase qhs.   Orders Placed This Encounter  Procedures  . POCT urinalysis dipstick   Meds ordered this encounter  Medications  . amLODipine (NORVASC) 10 MG tablet    Sig: Take 1 tablet (10 mg total) by mouth daily.    Dispense:  90 tablet    Refill:  1    Return in about 2 weeks (around 09/21/2015).    Lynita Groseclose Paulita Fujita, M.D. [facility] [address] [place], [place]  [address] [phone] phone [phone] fax

## 2015-09-07 NOTE — Patient Instructions (Addendum)
1.  RECOMMEND NASAL SALINE 2 SPRAYS INTO EACH NOSTRIL DAILY.  YOU CAN USE THIS EVERY DAY OF YOUR LIFE. 2.  RECOMMEND FLONASE NASAL SPRAY 2 SPRAYS INTO EACH NOSTRIL DAILY.  YOU CAN USE THIS EVERY DAY OF YOUR LIFE.    Nosebleed Nosebleeds are common. They are due to a crack in the inside lining of your nose (mucous membrane) or from a small blood vessel that starts to bleed. Nosebleeds can be caused by many conditions, such as injury, infections, dry mucous membranes or dry climate, medicines, nose picking, and home heating and cooling systems. Most nosebleeds come from blood vessels in the front of your nose. HOME CARE INSTRUCTIONS   Try controlling your nosebleed by pinching your nostrils gently and continuously for at least 10 minutes.  Avoid blowing or sniffing your nose for a number of hours after having a nosebleed.  Do not put gauze inside your nose yourself. If your nose was packed by your health care provider, try to maintain the pack inside of your nose until your health care provider removes it.  If a gauze pack was used and it starts to fall out, gently replace it or cut off the end of it.  If a balloon catheter was used to pack your nose, do not cut or remove it unless your health care provider has instructed you to do that.  Avoid lying down while you are having a nosebleed. Sit up and lean forward.  Use a nasal spray decongestant to help with a nosebleed as directed by your health care provider.  Do not use petroleum jelly or mineral oil in your nose. These can drip into your lungs.  Maintain humidity in your home by using less air conditioning or by using a humidifier.  Aspirinand blood thinners make bleeding more likely. If you are prescribed these medicines and you suffer from nosebleeds, ask your health care provider if you should stop taking the medicines or adjust the dose. Do not stop medicines unless directed by your health care provider  Resume your normal  activities as you are able, but avoid straining, lifting, or bending at the waist for several days.  If your nosebleed was caused by dry mucous membranes, use over-the-counter saline nasal spray or gel. This will keep the mucous membranes moist and allow them to heal. If you must use a lubricant, choose the water-soluble variety. Use it only sparingly, and do not use it within several hours of lying down.  Keep all follow-up visits as directed by your health care provider. This is important. SEEK MEDICAL CARE IF:  You have a fever.  You get frequent nosebleeds.  You are getting nosebleeds more often. SEEK IMMEDIATE MEDICAL CARE IF:  Your nosebleed lasts longer than 20 minutes.  Your nosebleed occurs after an injury to your face, and your nose looks crooked or broken.  You have unusual bleeding from other parts of your body.  You have unusual bruising on other parts of your body.  You feel light-headed or you faint.  You become sweaty.  You vomit blood.  Your nosebleed occurs after a head injury.   This information is not intended to replace advice given to you by your health care provider. Make sure you discuss any questions you have with your health care provider.   Document Released: 07/04/2005 Document Revised: 10/15/2014 Document Reviewed: 05/10/2014 Elsevier Interactive Patient Education [employer]

## 2015-09-23 ENCOUNTER — Encounter: Payer: Self-pay | Admitting: Family Medicine

## 2015-09-23 ENCOUNTER — Ambulatory Visit (INDEPENDENT_AMBULATORY_CARE_PROVIDER_SITE_OTHER): Payer: BLUE CROSS/BLUE SHIELD | Admitting: Family Medicine

## 2015-09-23 VITALS — BP 130/77 | HR 78 | Temp 98.4°F | Resp 16 | Ht 72.25 in | Wt 271.2 lb

## 2015-09-23 DIAGNOSIS — R81 Glycosuria: Secondary | ICD-10-CM | POA: Diagnosis not present

## 2015-09-23 DIAGNOSIS — J301 Allergic rhinitis due to pollen: Secondary | ICD-10-CM | POA: Diagnosis not present

## 2015-09-23 DIAGNOSIS — E119 Type 2 diabetes mellitus without complications: Secondary | ICD-10-CM

## 2015-09-23 DIAGNOSIS — R040 Epistaxis: Secondary | ICD-10-CM | POA: Diagnosis not present

## 2015-09-23 DIAGNOSIS — I10 Essential (primary) hypertension: Secondary | ICD-10-CM

## 2015-09-23 LAB — POCT GLYCOSYLATED HEMOGLOBIN (HGB A1C): HEMOGLOBIN A1C: 9

## 2015-09-23 LAB — GLUCOSE, POCT (MANUAL RESULT ENTRY): POC Glucose: 222 mg/dl — AB (ref 70–99)

## 2015-09-23 NOTE — Progress Notes (Signed)
Subjective:    Patient ID: Daniel [name], male    DOB: 1956-03-17, 59 y.o.   MRN: [id number]  09/23/2015  Follow-up   HPI This 59 y.o. male presents for three week follow-up:  1. Epistaxis L: persistent bleeding from L nare for several days after appointment.   Using nasal saline spray every night.  Did not get saline or Flonase for two days; continued to use Afrin and Vaseline for a few days after bleeding.  Flonase and saline seemed to help.  Can go months without issues.  Sometimes can go one year or more between nose bleeds.  2.  HTN:  Restarted Amlodipine daily.  No side effects.  N headaches, dizziness, chestp ain, blurred vision, leg swelling.   In 1993, woke up one morning and every part of body hurt except L arm.  With movement, felt fine. Went to pharmacy and he stated that had fever; recommended Tylenol with improvement.  Legs do ache a lot with damp rainy weathers.    3.  DMII:  One soda a day.  Does not like sugar in coffee or tea.  Fruit juice -- some.  Nocturia x 1-2 at baseline; no recent nocturia in past two weeks.   Review of Systems  Constitutional: Negative for fever, chills, diaphoresis, activity change, appetite change and fatigue.  Respiratory: Negative for cough and shortness of breath.   Cardiovascular: Negative for chest pain, palpitations and leg swelling.  Gastrointestinal: Negative for nausea, vomiting, abdominal pain and diarrhea.  Endocrine: Negative for cold intolerance, heat intolerance, polydipsia, polyphagia and polyuria.  Skin: Negative for color change, rash and wound.  Neurological: Negative for dizziness, tremors, seizures, syncope, facial asymmetry, speech difficulty, weakness, light-headedness, numbness and headaches.  Psychiatric/Behavioral: Negative for sleep disturbance and dysphoric mood. The patient is not nervous/anxious.     Past Medical History  Diagnosis Date  . Asthma   . Hypertension    History reviewed. No pertinent past surgical  history. No Known Allergies Current Outpatient Prescriptions  Medication Sig Dispense Refill  . amLODipine (NORVASC) 10 MG tablet Take 1 tablet (10 mg total) by mouth daily. 90 tablet 1  . ibuprofen (ADVIL,MOTRIN) 200 MG tablet Take 200 mg by mouth every 6 (six) hours as needed for pain (headache).     No current facility-administered medications for this visit.   Social History   Social History  . Marital Status: Unknown    Spouse Name: N/A  . Number of Children: N/A  . Years of Education: N/A   Occupational History  . Not on file.   Social History Main Topics  . Smoking status: Never Smoker   . Smokeless tobacco: Not on file  . Alcohol Use: No  . Drug Use: No  . Sexual Activity: No   Other Topics Concern  . Not on file   Social History Narrative   Marital status: single; not dating      Children: none      Lives: alone      Employment:  Maintenance/production      Tobacco:  None;       Alcohol: none      Drugs: none        Family History  Problem Relation Age of Onset  . Cancer Mother     unknown primary  . Heart attack Father   . Heart disease Father 30    [facility]I  . Cancer Paternal Aunt        Objective:    BP  130/77 mmHg  Pulse 78  Temp(Src) 98.4 F (36.9 C) (Oral)  Resp 16  Ht 6' 0.25" (1.835 m)  Wt 271 lb 3.2 oz (123.016 kg)  BMI 36.53 kg/m2  SpO2 97% Physical Exam  Constitutional: He is oriented to person, place, and time. He appears well-developed and well-nourished. No distress.  HENT:  Head: Normocephalic and atraumatic.  Right Ear: External ear normal.  Left Ear: External ear normal.  Nose: Nose normal.  Mouth/Throat: Oropharynx is clear and moist.  Eyes: Conjunctivae and EOM are normal. Pupils are equal, round, and reactive to light.  Neck: Normal range of motion. Neck supple. Carotid bruit is not present. No thyromegaly present.  Cardiovascular: Normal rate, regular rhythm, normal heart sounds and intact distal pulses.  Exam reveals no  gallop and no friction rub.   No murmur heard. Pulmonary/Chest: Effort normal and breath sounds normal. He has no wheezes. He has no rales.  Abdominal: Soft. Bowel sounds are normal. He exhibits no distension and no mass. There is no tenderness. There is no rebound and no guarding.  Lymphadenopathy:    He has no cervical adenopathy.  Neurological: He is alert and oriented to person, place, and time. No cranial nerve deficit.  Skin: Skin is warm and dry. No rash noted. He is not diaphoretic.  Psychiatric: He has a normal mood and affect. His behavior is normal.  Nursing note and vitals reviewed.  Results for orders placed or performed in visit on 09/23/15  POCT glucose (manual entry)  Result Value Ref Range   POC Glucose 222 (A) 70 - 99 mg/dl  POCT glycosylated hemoglobin (Hb A1C)  Result Value Ref Range   Hemoglobin A1C 9.0        Assessment & Plan:   1. Essential hypertension   2. Glucosuria   3. Epistaxis   4. Type 2 diabetes mellitus without complication, without long-term current use of insulin (HCC)   5. Seasonal allergic rhinitis due to pollen     Orders Placed This Encounter  Procedures  . POCT glucose (manual entry)  . POCT glycosylated hemoglobin (Hb A1C)   No orders of the defined types were placed in this encounter.    Return in about 3 months (around 12/22/2015) for recheck diabetes, high blood pressure.    Seon Gaertner Paulita Fujita, M.D. [facility] [address] [place], [place]  [address] [phone] phone [phone] fax

## 2015-09-23 NOTE — Patient Instructions (Signed)

## 2015-10-05 DIAGNOSIS — J302 Other seasonal allergic rhinitis: Secondary | ICD-10-CM | POA: Insufficient documentation

## 2015-10-07 DIAGNOSIS — I10 Essential (primary) hypertension: Secondary | ICD-10-CM | POA: Insufficient documentation

## 2015-10-07 DIAGNOSIS — E119 Type 2 diabetes mellitus without complications: Secondary | ICD-10-CM | POA: Insufficient documentation

## 2015-10-10 ENCOUNTER — Ambulatory Visit: Payer: Self-pay | Admitting: Family Medicine

## 2015-12-23 ENCOUNTER — Ambulatory Visit: Payer: BLUE CROSS/BLUE SHIELD | Admitting: Family Medicine

## 2016-01-10 ENCOUNTER — Ambulatory Visit: Payer: BLUE CROSS/BLUE SHIELD | Admitting: Family Medicine

## 2019-04-12 ENCOUNTER — Emergency Department (HOSPITAL_COMMUNITY)
Admission: EM | Admit: 2019-04-12 | Discharge: 2019-04-12 | Disposition: A | Discharge status: Home / Self Care | Payer: Self-pay | Attending: Emergency Medicine | Admitting: Emergency Medicine

## 2019-04-12 ENCOUNTER — Encounter (HOSPITAL_COMMUNITY): Payer: Self-pay

## 2019-04-12 ENCOUNTER — Other Ambulatory Visit: Payer: Self-pay

## 2019-04-12 DIAGNOSIS — J45909 Unspecified asthma, uncomplicated: Secondary | ICD-10-CM | POA: Insufficient documentation

## 2019-04-12 DIAGNOSIS — R040 Epistaxis: Secondary | ICD-10-CM | POA: Insufficient documentation

## 2019-04-12 DIAGNOSIS — I10 Essential (primary) hypertension: Secondary | ICD-10-CM | POA: Insufficient documentation

## 2019-04-12 DIAGNOSIS — R030 Elevated blood-pressure reading, without diagnosis of hypertension: Secondary | ICD-10-CM | POA: Insufficient documentation

## 2019-04-12 DIAGNOSIS — Z79899 Other long term (current) drug therapy: Secondary | ICD-10-CM | POA: Insufficient documentation

## 2019-04-12 NOTE — Discharge Instructions (Addendum)
You have been diagnosed today with left-sided nosebleed.  At this time there does not appear to be the presence of an emergent medical condition, however there is always the potential for conditions to change. Please read and follow the below instructions.  Please return to the Emergency Department immediately for any new or worsening symptoms. Please be sure to follow up with your Primary Care Provider within one week regarding your visit today; please call their office to schedule an appointment even if you are feeling better for a follow-up visit. Additionally your blood pressure was elevated here in the ER.  Please call your primary care doctor's office for blood pressure recheck and medication management within 1 week.  Get help right away if: You have a nosebleed after you fall or hurt your head. Your nosebleed does not go away after 20 minutes. You feel dizzy or weak. You have unusual bleeding from other parts of your body. You have unusual bruising on other parts of your body. You get sweaty. You throw up blood. You have fever or chills Get a very bad headache. Start to feel mixed up (confused). Feel weak or numb. Feel faint. Have very bad pain in your: Chest. Belly (abdomen). Throw up more than once. Have trouble breathing. Any new/concerning or worsening symptoms  Please read the additional information packets attached to your discharge summary.  Do not take your medicine if  develop an itchy rash, swelling in your mouth or lips, or difficulty breathing; call 911 and seek immediate emergency medical attention if this occurs.

## 2019-04-12 NOTE — ED Provider Notes (Signed)
[facility] DEPT Provider Note   CSN: [id number] Arrival date & time: 04/12/19  1653    History   Chief Complaint Chief Complaint  Patient presents with   Epistaxis    HPI Daniel [name] is a 63 y.o. male with history of type 2 diabetes, hypertension, asthma presents today for left-sided epistaxis.  Patient reports that yesterday he had an episode of left-sided epistaxis around 11 AM which subsided after a short amount of time.  Patient reports that this morning he had another episode of epistaxis for which she was seen at an urgent care.  Patient reports that they used a silver nitrate stick to stem the bleeding successfully.  Patient reports that since he left the urgent care he has not had any additional bleeding but reports that he spit and noticed a small amount of blood in his saliva.  Patient comes to the ER requesting an additional silver nitrate stick for prophylaxis.  He denies fall/injury, blood thinner use, dizziness/lightheadedness, chest pain/shortness of breath, nausea/vomiting, vision changes or any additional concerns.  He reports that he is only here for additional cauterization of his left-sided nosebleed.    HPI  Past Medical History:  Diagnosis Date   Asthma    Hypertension     Patient Active Problem List   Diagnosis Date Noted   Essential hypertension 10/07/2015   Type 2 diabetes mellitus without complication, without long-term current use of insulin ([place]) 10/07/2015   Other seasonal allergic rhinitis 10/05/2015   Essential hypertension, benign 01/13/2013    History reviewed. No pertinent surgical history.      Home Medications    Prior to Admission medications   Medication Sig Start Date End Date Taking? Authorizing Provider  amLODipine (NORVASC) 10 MG tablet Take 1 tablet (10 mg total) by mouth daily. 09/07/15   Wardell Honour, MD  ibuprofen (ADVIL,MOTRIN) 200 MG tablet Take 200 mg by mouth every 6 (six) hours  as needed for pain (headache).    [provider]    Family History Family History  Problem Relation Age of Onset   Cancer Mother        unknown primary   Heart attack Father    Heart disease Father 72       AMI   Cancer Paternal Aunt     Social History Social History   Tobacco Use   Smoking status: Never Smoker   Smokeless tobacco: Never Used  Substance Use Topics   Alcohol use: No   Drug use: No     Allergies   Patient has no known allergies.   Review of Systems Review of Systems  Constitutional: Negative.  Negative for chills and fever.  HENT: Positive for nosebleeds. Negative for sore throat and trouble swallowing.   Eyes: Negative.  Negative for visual disturbance.  Respiratory: Negative.  Negative for shortness of breath.   Cardiovascular: Negative.  Negative for chest pain.  Gastrointestinal: Negative.  Negative for nausea and vomiting.  Neurological: Negative.  Negative for dizziness, weakness, numbness and headaches.  All other systems reviewed and are negative.  Physical Exam Updated Vital Signs BP (!) 167/102 (BP Location: Right Arm)    Pulse 62    Temp 98.9 F (37.2 C) (Oral)    Resp 18    Ht 6' (1.829 m)    Wt 122.5 kg    SpO2 100%    BMI 36.62 kg/m   Physical Exam Constitutional:      General: He is not  in acute distress.    Appearance: Normal appearance. He is well-developed. He is obese. He is not ill-appearing or diaphoretic.  HENT:     Head: Normocephalic and atraumatic. No raccoon eyes or Battle's sign.     Jaw: There is normal jaw occlusion. No trismus.     Right Ear: External ear normal.     Left Ear: External ear normal.     Nose: Nose normal.     Right Nostril: No foreign body, epistaxis or septal hematoma.     Left Nostril: No foreign body, epistaxis or septal hematoma.     Comments: Small scab present to left side nasal septum, small gray area present consistent with silver nitrate therapy.  No active bleeding.     Mouth/Throat:     Mouth: Mucous membranes are moist.     Pharynx: Oropharynx is clear. Uvula midline.     Comments: No intraoral bleeding Eyes:     General: Vision grossly intact. Gaze aligned appropriately.     Extraocular Movements: Extraocular movements intact.     Conjunctiva/sclera: Conjunctivae normal.     Pupils: Pupils are equal, round, and reactive to light.  Neck:     Musculoskeletal: Full passive range of motion without pain, normal range of motion and neck supple.     Trachea: Trachea and phonation normal. No tracheal deviation.  Pulmonary:     Effort: Pulmonary effort is normal. No respiratory distress.  Abdominal:     General: There is no distension.     Palpations: Abdomen is soft.     Tenderness: There is no abdominal tenderness. There is no guarding or rebound.  Musculoskeletal: Normal range of motion.  Skin:    General: Skin is warm and dry.  Neurological:     Mental Status: He is alert.     GCS: GCS eye subscore is 4. GCS verbal subscore is 5. GCS motor subscore is 6.     Comments: Speech is clear and goal oriented, follows commands Major Cranial nerves without deficit, no facial droop Moves extremities without ataxia, coordination intact  Psychiatric:        Behavior: Behavior normal.    ED Treatments / Results  Labs (all labs ordered are listed, but only abnormal results are displayed) Labs Reviewed - No data to display  EKG None  Radiology No results found.  Procedures Procedures (including critical care time)  Medications Ordered in ED Medications - No data to display   Initial Impression / Assessment and Plan / ED Course  I have reviewed the triage vital signs and the nursing notes.  Pertinent labs & imaging results that were available during my care of the patient were reviewed by me and considered in my medical decision making (see chart for details).  Daniel [name] is a 63 y.o. male who presents to ED for nosebleed. No signs of anemia or  other complicating feature. Not on anti-coagulation.  Bleeding subsided prior to ED arrival.  Small scab present in addition to area treated with silver nitrate stick at urgent care.  No tachycardia, chest pain or shortness of breath.  Patient is overall well-appearing and in no acute distress.  No indication for further cauterization at this time.    The patient was noted to have elevated BP in ED today. Patient aware of elevated blood pressure readings and the need for improved management. I instructed the patient to followup with their PCP within 1 week for BP check. I also counseled the patient regarding the  signs and symptoms which would require an emergent visit to an emergency department for hypertensive urgency and/or hypertensive emergency.  At this time there does not appear to be any evidence of an acute emergency medical condition and the patient appears stable for discharge with appropriate outpatient follow up. Diagnosis was discussed with patient who verbalizes understanding of care plan and is agreeable to discharge. I have discussed return precautions with patient who verbalizes understanding of return precautions. Patient encouraged to follow-up with their PCP. All questions answered.  Patient has been discharged in good condition.  Patient's case discussed with Dr. Lynelle DoctorKnapp  who agrees with plan to discharge with follow-up.   Note: Portions of this report may have been transcribed using voice recognition software. Every effort was made to ensure accuracy; however, inadvertent computerized transcription errors may still be present. Final Clinical Impressions(s) / ED Diagnoses   Final diagnoses:  Left-sided epistaxis  Elevated blood pressure reading    ED Discharge Orders    None       Elizabeth PalauMorelli, Nashali Ditmer A, PA-C 04/12/19 2033    Linwood DibblesKnapp, Jon, MD 04/13/19 1322

## 2019-04-12 NOTE — ED Triage Notes (Signed)
Pt states he was over at [facility] for a nose bleed. Pt states they put in a "stick" that helped. Pt states he still has a small amount of blood when he spits from drainage. Pt then came here so he could get something in his nose.  Pt states he also had one yesterday. No active bleeding in triage. Pt has small tissue in nose that he removed with very minimal drainage.

## 2019-04-12 NOTE — ED Notes (Signed)
Pt is alert and oriented x 4 and is verbally responsive. Pt denies pain at this time. Nose is not bleeding at this time. Applied ice to area.

## 2021-10-02 ENCOUNTER — Encounter (HOSPITAL_COMMUNITY): Payer: Self-pay | Admitting: Cardiology

## 2021-10-02 ENCOUNTER — Other Ambulatory Visit: Payer: Self-pay

## 2021-10-02 ENCOUNTER — Encounter (HOSPITAL_COMMUNITY)
Admission: EM | Disposition: A | Discharge status: Home / Self Care | Payer: Self-pay | Source: Home / Self Care | Attending: Cardiology

## 2021-10-02 ENCOUNTER — Inpatient Hospital Stay (HOSPITAL_COMMUNITY)
Admission: EM | Admit: 2021-10-02 | Discharge: 2021-10-05 | DRG: 286 | Disposition: A | Discharge status: Home / Self Care | Payer: Medicaid Other | Attending: Cardiology | Admitting: Cardiology

## 2021-10-02 ENCOUNTER — Emergency Department (HOSPITAL_COMMUNITY): Payer: Medicaid Other

## 2021-10-02 DIAGNOSIS — I4901 Ventricular fibrillation: Secondary | ICD-10-CM | POA: Diagnosis present

## 2021-10-02 DIAGNOSIS — I110 Hypertensive heart disease with heart failure: Secondary | ICD-10-CM | POA: Diagnosis present

## 2021-10-02 DIAGNOSIS — I409 Acute myocarditis, unspecified: Secondary | ICD-10-CM | POA: Diagnosis present

## 2021-10-02 DIAGNOSIS — E119 Type 2 diabetes mellitus without complications: Secondary | ICD-10-CM | POA: Diagnosis present

## 2021-10-02 DIAGNOSIS — I38 Endocarditis, valve unspecified: Secondary | ICD-10-CM | POA: Diagnosis present

## 2021-10-02 DIAGNOSIS — Z20822 Contact with and (suspected) exposure to covid-19: Secondary | ICD-10-CM | POA: Diagnosis present

## 2021-10-02 DIAGNOSIS — Z8249 Family history of ischemic heart disease and other diseases of the circulatory system: Secondary | ICD-10-CM | POA: Diagnosis not present

## 2021-10-02 DIAGNOSIS — I5021 Acute systolic (congestive) heart failure: Secondary | ICD-10-CM | POA: Diagnosis present

## 2021-10-02 DIAGNOSIS — I401 Isolated myocarditis: Secondary | ICD-10-CM

## 2021-10-02 DIAGNOSIS — I249 Acute ischemic heart disease, unspecified: Secondary | ICD-10-CM

## 2021-10-02 DIAGNOSIS — E782 Mixed hyperlipidemia: Secondary | ICD-10-CM | POA: Diagnosis present

## 2021-10-02 DIAGNOSIS — I472 Ventricular tachycardia, unspecified: Secondary | ICD-10-CM | POA: Diagnosis present

## 2021-10-02 DIAGNOSIS — Z79899 Other long term (current) drug therapy: Secondary | ICD-10-CM | POA: Diagnosis not present

## 2021-10-02 DIAGNOSIS — J45909 Unspecified asthma, uncomplicated: Secondary | ICD-10-CM | POA: Diagnosis present

## 2021-10-02 DIAGNOSIS — E118 Type 2 diabetes mellitus with unspecified complications: Secondary | ICD-10-CM

## 2021-10-02 DIAGNOSIS — Z809 Family history of malignant neoplasm, unspecified: Secondary | ICD-10-CM

## 2021-10-02 DIAGNOSIS — I493 Ventricular premature depolarization: Secondary | ICD-10-CM | POA: Diagnosis present

## 2021-10-02 DIAGNOSIS — I2510 Atherosclerotic heart disease of native coronary artery without angina pectoris: Secondary | ICD-10-CM | POA: Diagnosis present

## 2021-10-02 HISTORY — PX: LEFT HEART CATH AND CORONARY ANGIOGRAPHY: CATH118249

## 2021-10-02 LAB — I-STAT CHEM 8, ED
BUN: 16 mg/dL (ref 8–23)
Calcium, Ion: 1.03 mmol/L — ABNORMAL LOW (ref 1.15–1.40)
Chloride: 98 mmol/L (ref 98–111)
Creatinine, Ser: 1.1 mg/dL (ref 0.61–1.24)
Glucose, Bld: 397 mg/dL — ABNORMAL HIGH (ref 70–99)
HCT: 54 % — ABNORMAL HIGH (ref 39.0–52.0)
Hemoglobin: 18.4 g/dL — ABNORMAL HIGH (ref 13.0–17.0)
Potassium: 3.1 mmol/L — ABNORMAL LOW (ref 3.5–5.1)
Sodium: 134 mmol/L — ABNORMAL LOW (ref 135–145)
TCO2: 21 mmol/L — ABNORMAL LOW (ref 22–32)

## 2021-10-02 LAB — LACTIC ACID, PLASMA
Lactic Acid, Venous: 4 mmol/L (ref 0.5–1.9)
Lactic Acid, Venous: 1.9 mmol/L (ref 0.5–1.9)
Lactic Acid, Venous: 1.9 mmol/L (ref 0.5–1.9)

## 2021-10-02 LAB — CBC WITH DIFFERENTIAL/PLATELET
Abs Immature Granulocytes: 0.05 10*3/uL (ref 0.00–0.07)
Basophils Absolute: 0 10*3/uL (ref 0.0–0.1)
Basophils Relative: 0 %
Eosinophils Absolute: 0 10*3/uL (ref 0.0–0.5)
Eosinophils Relative: 0 %
HCT: 53.7 % — ABNORMAL HIGH (ref 39.0–52.0)
Hemoglobin: 17.9 g/dL — ABNORMAL HIGH (ref 13.0–17.0)
Immature Granulocytes: 1 %
Lymphocytes Relative: 19 %
Lymphs Abs: 1.9 10*3/uL (ref 0.7–4.0)
MCH: 29.4 pg (ref 26.0–34.0)
MCHC: 33.3 g/dL (ref 30.0–36.0)
MCV: 88.3 fL (ref 80.0–100.0)
Monocytes Absolute: 0.5 10*3/uL (ref 0.1–1.0)
Monocytes Relative: 5 %
Neutro Abs: 7.6 10*3/uL (ref 1.7–7.7)
Neutrophils Relative %: 75 %
Platelets: 166 10*3/uL (ref 150–400)
RBC: 6.08 MIL/uL — ABNORMAL HIGH (ref 4.22–5.81)
RDW: 12.4 % (ref 11.5–15.5)
WBC: 10.1 10*3/uL (ref 4.0–10.5)
nRBC: 0 % (ref 0.0–0.2)

## 2021-10-02 LAB — COMPREHENSIVE METABOLIC PANEL
ALT: 282 U/L — ABNORMAL HIGH (ref 0–44)
AST: 205 U/L — ABNORMAL HIGH (ref 15–41)
Albumin: 4 g/dL (ref 3.5–5.0)
Alkaline Phosphatase: 92 U/L (ref 38–126)
Anion gap: 20 — ABNORMAL HIGH (ref 5–15)
BUN: 14 mg/dL (ref 8–23)
CO2: 17 mmol/L — ABNORMAL LOW (ref 22–32)
Calcium: 9.1 mg/dL (ref 8.9–10.3)
Chloride: 96 mmol/L — ABNORMAL LOW (ref 98–111)
Creatinine, Ser: 1.4 mg/dL — ABNORMAL HIGH (ref 0.61–1.24)
GFR, Estimated: 56 mL/min — ABNORMAL LOW (ref >60)
Glucose, Bld: 422 mg/dL — ABNORMAL HIGH (ref 70–99)
Potassium: 3 mmol/L — ABNORMAL LOW (ref 3.5–5.1)
Sodium: 133 mmol/L — ABNORMAL LOW (ref 135–145)
Total Bilirubin: 2.2 mg/dL — ABNORMAL HIGH (ref 0.3–1.2)
Total Protein: 6.7 g/dL (ref 6.5–8.1)

## 2021-10-02 LAB — LIPID PANEL
Cholesterol: 180 mg/dL (ref 0–200)
HDL: 48 mg/dL (ref >40)
LDL Cholesterol: 94 mg/dL (ref 0–99)
Total CHOL/HDL Ratio: 3.8 RATIO
Triglycerides: 189 mg/dL — ABNORMAL HIGH (ref <150)
VLDL: 38 mg/dL (ref 0–40)

## 2021-10-02 LAB — GLUCOSE, CAPILLARY
Glucose-Capillary: 365 mg/dL — ABNORMAL HIGH (ref 70–99)
Glucose-Capillary: 356 mg/dL — ABNORMAL HIGH (ref 70–99)

## 2021-10-02 LAB — PROTIME-INR
INR: 1 (ref 0.8–1.2)
Prothrombin Time: 13 seconds (ref 11.4–15.2)

## 2021-10-02 LAB — RESP PANEL BY RT-PCR (FLU A&B, COVID) ARPGX2
Influenza A by PCR: NEGATIVE
Influenza B by PCR: NEGATIVE
SARS Coronavirus 2 by RT PCR: NEGATIVE

## 2021-10-02 LAB — TROPONIN I (HIGH SENSITIVITY)
Troponin I (High Sensitivity): 227 ng/L (ref <18)
Troponin I (High Sensitivity): 4978 ng/L (ref <18)

## 2021-10-02 LAB — ABO/RH: ABO/RH(D): O POS

## 2021-10-02 LAB — HEMOGLOBIN A1C
Hgb A1c MFr Bld: 12.5 % — ABNORMAL HIGH (ref 4.8–5.6)
Mean Plasma Glucose: 312.05 mg/dL

## 2021-10-02 LAB — BRAIN NATRIURETIC PEPTIDE: B Natriuretic Peptide: 216.6 pg/mL — ABNORMAL HIGH (ref 0.0–100.0)

## 2021-10-02 LAB — TYPE AND SCREEN
ABO/RH(D): O POS
Antibody Screen: NEGATIVE

## 2021-10-02 LAB — MRSA NEXT GEN BY PCR, NASAL: MRSA by PCR Next Gen: NOT DETECTED

## 2021-10-02 IMAGING — DX DG CHEST 1V PORT
1 series · 1 of 1 positions shown · non-contrast
Comparison: None.

CLINICAL DATA: Chest pain

EXAM:
PORTABLE CHEST 1 VIEW

[chest]
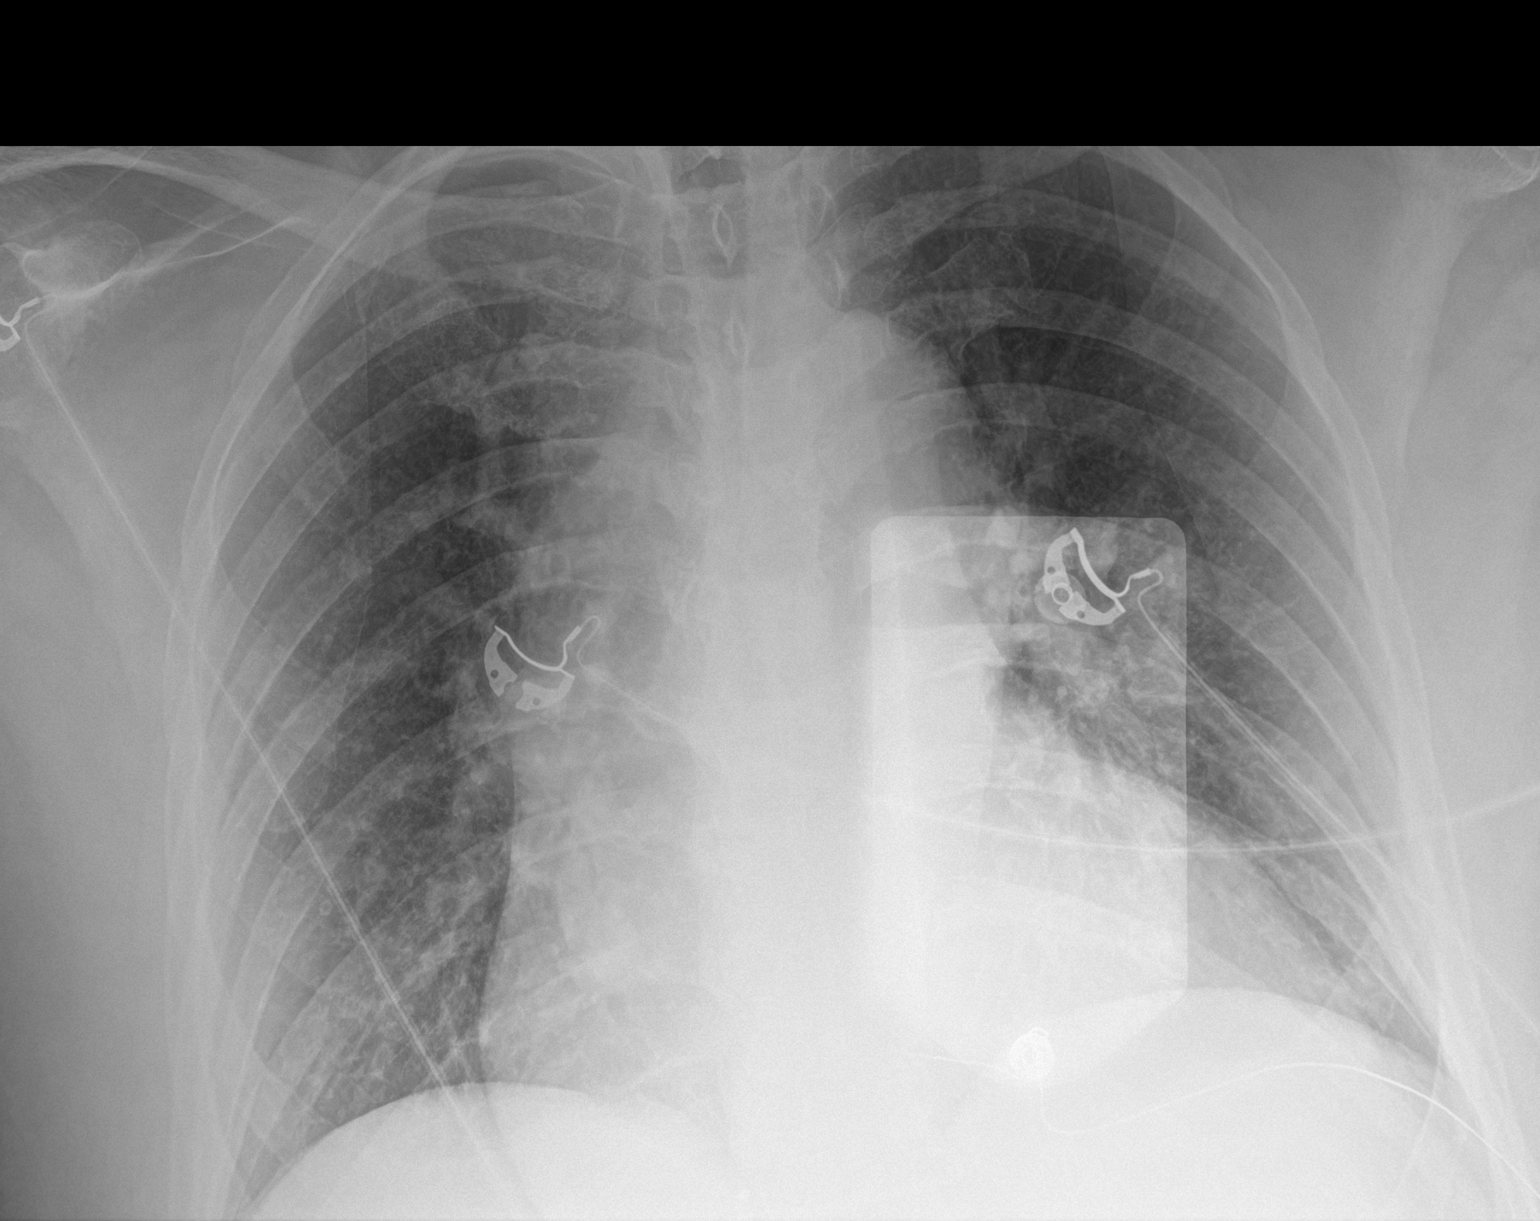

[1 of 1 positions shown; findings below may reference images not displayed]

FINDINGS: Cardiomegaly. No confluent airspace opacities, effusions or edema.
No acute bony abnormality.
IMPRESSION: Cardiomegaly.  No active disease.

## 2021-10-02 SURGERY — LEFT HEART CATH AND CORONARY ANGIOGRAPHY
Anesthesia: LOCAL

## 2021-10-02 MED ORDER — VERAPAMIL HCL 2.5 MG/ML IV SOLN
INTRAVENOUS | Status: AC
  Filled 2021-10-02: qty 2

## 2021-10-02 MED ORDER — MAGNESIUM SULFATE 2 GM/50ML IV SOLN
2.0000 g | Freq: Once | INTRAVENOUS | Status: DC
  Filled 2021-10-02 (×2): qty 50

## 2021-10-02 MED ORDER — POTASSIUM CHLORIDE CRYS ER 20 MEQ PO TBCR
40.0000 meq | EXTENDED_RELEASE_TABLET | Freq: Two times a day (BID) | ORAL | Status: AC
  Administered 2021-10-02 (×2): 40 meq via ORAL
  Filled 2021-10-02 (×2): qty 2

## 2021-10-02 MED ORDER — ASPIRIN 81 MG PO CHEW
324.0000 mg | CHEWABLE_TABLET | Freq: Once | ORAL | Status: AC
  Administered 2021-10-02: 14:00:00 324 mg via ORAL

## 2021-10-02 MED ORDER — LIDOCAINE HCL (PF) 1 % IJ SOLN
INTRAMUSCULAR | Status: AC
  Filled 2021-10-02: qty 30

## 2021-10-02 MED ORDER — HEPARIN (PORCINE) IN NACL 1000-0.9 UT/500ML-% IV SOLN
INTRAVENOUS | Status: DC | PRN
  Administered 2021-10-02 (×2): 500 mL

## 2021-10-02 MED ORDER — LIDOCAINE HCL (PF) 1 % IJ SOLN
INTRAMUSCULAR | Status: DC | PRN
  Administered 2021-10-02: 2 mL

## 2021-10-02 MED ORDER — POTASSIUM CHLORIDE 10 MEQ/100ML IV SOLN
INTRAVENOUS | Status: AC | PRN
  Administered 2021-10-02: 10 meq via INTRAVENOUS

## 2021-10-02 MED ORDER — SODIUM CHLORIDE 0.9 % IV SOLN: INTRAVENOUS | Status: DC

## 2021-10-02 MED ORDER — AMIODARONE HCL IN DEXTROSE 360-4.14 MG/200ML-% IV SOLN
30.0000 mg/h | INTRAVENOUS | Status: DC
  Administered 2021-10-02 – 2021-10-03 (×3): 30 mg/h via INTRAVENOUS
  Filled 2021-10-02 (×3): qty 200

## 2021-10-02 MED ORDER — INSULIN ASPART 100 UNIT/ML IJ SOLN
0.0000 [IU] | Freq: Three times a day (TID) | INTRAMUSCULAR | Status: DC
  Administered 2021-10-02 – 2021-10-03 (×2): 15 [IU] via SUBCUTANEOUS
  Administered 2021-10-03: 17:00:00 8 [IU] via SUBCUTANEOUS
  Administered 2021-10-03: 13:00:00 15 [IU] via SUBCUTANEOUS
  Administered 2021-10-04: 07:00:00 3 [IU] via SUBCUTANEOUS
  Administered 2021-10-04: 16:00:00 11 [IU] via SUBCUTANEOUS
  Administered 2021-10-04: 11:00:00 5 [IU] via SUBCUTANEOUS
  Administered 2021-10-05: 09:00:00 3 [IU] via SUBCUTANEOUS
  Administered 2021-10-05: 13:00:00 8 [IU] via SUBCUTANEOUS

## 2021-10-02 MED ORDER — AMIODARONE LOAD VIA INFUSION
INTRAVENOUS | Status: DC | PRN
  Administered 2021-10-02: 15:00:00 150 mg via INTRAVENOUS

## 2021-10-02 MED ORDER — AMIODARONE HCL IN DEXTROSE 360-4.14 MG/200ML-% IV SOLN
60.0000 mg/h | INTRAVENOUS | Status: DC
  Administered 2021-10-02 (×2): 60 mg/h via INTRAVENOUS
  Filled 2021-10-02: qty 200

## 2021-10-02 MED ORDER — HEPARIN (PORCINE) 25000 UT/250ML-% IV SOLN
1600.0000 [IU]/h | INTRAVENOUS | Status: DC
  Administered 2021-10-02: 1400 [IU]/h via INTRAVENOUS
  Administered 2021-10-03: 13:00:00 1600 [IU]/h via INTRAVENOUS
  Filled 2021-10-02 (×2): qty 250

## 2021-10-02 MED ORDER — ALUM & MAG HYDROXIDE-SIMETH 200-200-20 MG/5ML PO SUSP
15.0000 mL | ORAL | Status: DC | PRN
  Administered 2021-10-02 – 2021-10-05 (×6): 15 mL via ORAL
  Filled 2021-10-02 (×5): qty 30

## 2021-10-02 MED ORDER — DEXTROSE 5 % IV SOLN
INTRAVENOUS | Status: DC | PRN
  Administered 2021-10-02: 13:00:00 150 mg via INTRAVENOUS

## 2021-10-02 MED ORDER — HEPARIN (PORCINE) IN NACL 1000-0.9 UT/500ML-% IV SOLN
INTRAVENOUS | Status: AC
  Filled 2021-10-02: qty 1000

## 2021-10-02 MED ORDER — SACUBITRIL-VALSARTAN 24-26 MG PO TABS
1.0000 | ORAL_TABLET | Freq: Two times a day (BID) | ORAL | Status: DC
  Administered 2021-10-03 – 2021-10-05 (×5): 1 via ORAL
  Filled 2021-10-02 (×6): qty 1

## 2021-10-02 MED ORDER — MAGNESIUM SULFATE 2 GM/50ML IV SOLN: 2.0000 g | Freq: Once | INTRAVENOUS | Status: DC

## 2021-10-02 MED ORDER — IOHEXOL 350 MG/ML SOLN
INTRAVENOUS | Status: DC | PRN
  Administered 2021-10-02: 15:00:00 70 mL

## 2021-10-02 MED ORDER — MAGNESIUM SULFATE 4 GM/100ML IV SOLN
4.0000 g | Freq: Once | INTRAVENOUS | Status: AC
  Administered 2021-10-02: 16:00:00 4 g via INTRAVENOUS
  Filled 2021-10-02: qty 100

## 2021-10-02 MED ORDER — SODIUM CHLORIDE 0.9 % IV BOLUS
1000.0000 mL | Freq: Once | INTRAVENOUS | Status: AC
  Administered 2021-10-02: 13:00:00 1000 mL via INTRAVENOUS

## 2021-10-02 MED ORDER — FENTANYL CITRATE (PF) 100 MCG/2ML IJ SOLN
INTRAMUSCULAR | Status: DC | PRN
  Administered 2021-10-02: 25 ug via INTRAVENOUS

## 2021-10-02 MED ORDER — FENTANYL CITRATE (PF) 100 MCG/2ML IJ SOLN
INTRAMUSCULAR | Status: AC
  Filled 2021-10-02: qty 2

## 2021-10-02 MED ORDER — AMIODARONE LOAD VIA INFUSION
150.0000 mg | Freq: Once | INTRAVENOUS | Status: DC
  Filled 2021-10-02: qty 83.34

## 2021-10-02 MED ORDER — MIDAZOLAM HCL 2 MG/2ML IJ SOLN
INTRAMUSCULAR | Status: AC
  Filled 2021-10-02: qty 2

## 2021-10-02 MED ORDER — HEPARIN SODIUM (PORCINE) 1000 UNIT/ML IJ SOLN
INTRAMUSCULAR | Status: AC
  Filled 2021-10-02: qty 10

## 2021-10-02 MED ORDER — HEPARIN SODIUM (PORCINE) 5000 UNIT/ML IJ SOLN
4000.0000 [IU] | Freq: Once | INTRAMUSCULAR | Status: AC
  Administered 2021-10-02: 14:00:00 4000 [IU] via INTRAVENOUS

## 2021-10-02 MED ORDER — VERAPAMIL HCL 2.5 MG/ML IV SOLN
INTRAVENOUS | Status: DC | PRN
  Administered 2021-10-02: 14:00:00 10 mL via INTRA_ARTERIAL

## 2021-10-02 MED ORDER — HEPARIN SODIUM (PORCINE) 1000 UNIT/ML IJ SOLN
INTRAMUSCULAR | Status: DC | PRN
  Administered 2021-10-02: 4000 [IU] via INTRAVENOUS

## 2021-10-02 MED ORDER — INSULIN ASPART 100 UNIT/ML IJ SOLN
4.0000 [IU] | Freq: Three times a day (TID) | INTRAMUSCULAR | Status: DC
  Administered 2021-10-03 – 2021-10-04 (×5): 4 [IU] via SUBCUTANEOUS

## 2021-10-02 MED ORDER — INSULIN ASPART 100 UNIT/ML IJ SOLN
0.0000 [IU] | Freq: Every day | INTRAMUSCULAR | Status: DC
  Administered 2021-10-02: 22:00:00 5 [IU] via SUBCUTANEOUS
  Administered 2021-10-03: 21:00:00 2 [IU] via SUBCUTANEOUS
  Administered 2021-10-04: 23:00:00 3 [IU] via SUBCUTANEOUS

## 2021-10-02 MED ORDER — LIDOCAINE IN D5W 4-5 MG/ML-% IV SOLN
1.0000 mg/min | INTRAVENOUS | Status: DC
  Administered 2021-10-02 – 2021-10-04 (×2): 1 mg/min via INTRAVENOUS
  Filled 2021-10-02 (×2): qty 500

## 2021-10-02 MED ORDER — CHLORHEXIDINE GLUCONATE CLOTH 2 % EX PADS
6.0000 | MEDICATED_PAD | Freq: Every day | CUTANEOUS | Status: DC
  Administered 2021-10-02 – 2021-10-04 (×3): 6 via TOPICAL

## 2021-10-02 MED ORDER — MIDAZOLAM HCL 2 MG/2ML IJ SOLN
INTRAMUSCULAR | Status: DC | PRN
  Administered 2021-10-02: .5 mg via INTRAVENOUS

## 2021-10-02 SURGICAL SUPPLY — 12 items
CATH INFINITI 5FR ANG PIGTAIL (CATHETERS) ×1 IMPLANT
CATH OPTITORQUE TIG 4.0 5F (CATHETERS) ×1 IMPLANT
DEVICE RAD COMP TR BAND LRG (VASCULAR PRODUCTS) ×1 IMPLANT
GLIDESHEATH SLEND A-KIT 6F 22G (SHEATH) ×1 IMPLANT
GUIDEWIRE INQWIRE 1.5J.035X260 (WIRE) IMPLANT
INQWIRE 1.5J .035X260CM (WIRE) ×2
KIT ENCORE 26 ADVANTAGE (KITS) ×1 IMPLANT
KIT HEART LEFT (KITS) ×2 IMPLANT
PACK CARDIAC CATHETERIZATION (CUSTOM PROCEDURE TRAY) ×2 IMPLANT
SYR MEDRAD MARK 7 150ML (SYRINGE) ×2 IMPLANT
TRANSDUCER W/STOPCOCK (MISCELLANEOUS) ×2 IMPLANT
TUBING CIL FLEX 10 FLL-RA (TUBING) ×2 IMPLANT

## 2021-10-02 NOTE — Progress Notes (Signed)
ANTICOAGULATION CONSULT NOTE - Initial Consult  Pharmacy Consult for IV Heparin Indication: chest pain/ACS   No Known Allergies  Patient Measurements: Height: 6\' 6"  (198.1 cm) Weight: 122.5 kg (270 lb) IBW/kg (Calculated) : 91.4 Heparin Dosing Weight: 116.7 kg  Vital Signs: Temp: 97.7 F (36.5 C) (12/26 1600) Temp Source: Oral (12/26 1600) BP: 136/91 (12/26 1645) Pulse Rate: 73 (12/26 1645)  Labs: Recent Labs    10/02/21 1323 10/02/21 1334  HGB 17.9* 18.4*  HCT 53.7* 54.0*  PLT 166  --   LABPROT 13.0  --   INR 1.0  --   CREATININE 1.40* 1.10  TROPONINIHS 227*  --     Estimated Creatinine Clearance: 100.9 mL/min (by C-G formula based on SCr of 1.1 mg/dL).   Medical History: Past Medical History:  Diagnosis Date   Asthma    Hypertension     Medications:  Scheduled:   amiodarone  150 mg Intravenous Once   Chlorhexidine Gluconate Cloth  6 each Topical Daily   [START ON 10/03/2021] insulin aspart  0-15 Units Subcutaneous TID WC   insulin aspart  0-5 Units Subcutaneous QHS   [START ON 10/03/2021] insulin aspart  4 Units Subcutaneous TID WC   potassium chloride  40 mEq Oral BID   Infusions:   sodium chloride 10 mL/hr at 10/02/21 1600   amiodarone (NEXTERONE) IV bolus only 150 mg/100 mL     amiodarone 60 mg/hr (10/02/21 1600)   Followed by   amiodarone     lidocaine 1 mg/min (10/02/21 1600)   magnesium sulfate bolus IVPB 50 mL/hr at 10/02/21 1600    Assessment: 65 years of age male status post cardiac cath with moderate disease and 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 Post-cath,  Dr 66 has consulted pharmacy to start IV Heparin for treatment of ACS and as discussed with delay start until 4 hours after TR band has been removed. Patient was not on anticoagulation prior to this admission.   TR band removed at ~1900 PM. H/H elevated on admission. Platelets 166. No bleeding reported.   Goal of Therapy:  Heparin level 0.3-0.7  units/ml Monitor platelets by anticoagulation protocol: Yes   Plan:  Start IV Heparin at 2300 PM (4hrs post TR band) at rate of 1400 units/hr.  No bolus due to recent procedure.  Will check Heparin level in 6 hours.  Will monitor daily Heparin level and CBC while on therapy.   Rosemary Holms, PharmD, BCPS, BCCCP Clinical Pharmacist Please refer to [facility] for [facility] numbers 10/02/2021,4:49 PM

## 2021-10-02 NOTE — H&P (Signed)
Daniel [name] is an 65 y.o. male.   Chief Complaint: Ventricular tachycardia HPI:   65 y.o. caucasian male  with hypertension, asthma, with unstable VF/VT.  Patient lives by himself. He called 911 this morning with complaints if "indigestion" and difficulty breathing starting 11 AM. When EMS arrived, patient was hemodynamically stable, but had multiple runs of VF/VT. He was shocked 4 times by EMS and was given 150 IV amiodarone. He has continued to have symptoms that he still describes as indigestion, radiating to his throat. He continues to have multiple runs of VF/VT. He received another 150 IV amiodarone and is now on amiodarone infusion.   Past Medical History:  Diagnosis Date   Asthma    Hypertension     No past surgical history on file.   Family History  Problem Relation Age of Onset   Cancer Mother        unknown primary   Heart attack Father    Heart disease Father 54       AMI   Cancer Paternal Aunt     Social History:  reports that he has never smoked. He has never used smokeless tobacco. He reports that he does not drink alcohol and does not use drugs.  Allergies: No Known Allergies  Review of Systems  Constitutional: Positive for malaise/fatigue. Negative for decreased appetite, weight gain and weight loss.  HENT:  Negative for congestion.   Eyes:  Negative for visual disturbance.  Cardiovascular:  Negative for chest pain, dyspnea on exertion, leg swelling, palpitations and syncope.  Respiratory:  Positive for shortness of breath. Negative for cough.   Endocrine: Negative for cold intolerance.  Hematologic/Lymphatic: Does not bruise/bleed easily.  Skin:  Negative for itching and rash.  Musculoskeletal:  Negative for myalgias.  Gastrointestinal:  Positive for heartburn. Negative for abdominal pain, nausea and vomiting.  Genitourinary:  Negative for dysuria.  Neurological:  Negative for dizziness and weakness.  Psychiatric/Behavioral:  The patient is not  nervous/anxious.   All other systems reviewed and are negative.   Blood pressure 104/72, resp. rate 16, height 6\' 6"  (1.981 m), weight 122.5 kg. Body mass index is 31.2 kg/m.  Physical Exam Vitals and nursing note reviewed.  Constitutional:      General: He is in acute distress.     Appearance: He is well-developed.  HENT:     Head: Normocephalic and atraumatic.  Eyes:     Conjunctiva/sclera: Conjunctivae normal.     Pupils: Pupils are equal, round, and reactive to light.  Neck:     Vascular: No JVD.  Cardiovascular:     Rate and Rhythm: Regular rhythm. Tachycardia present.     Pulses: Normal pulses and intact distal pulses.     Heart sounds: No murmur heard.    Comments: Pulses normal but intermittently perfusing Pulmonary:     Effort: Pulmonary effort is normal.     Breath sounds: Normal breath sounds. No wheezing or rales.     Comments: On NRM mask Abdominal:     General: Bowel sounds are normal.     Palpations: Abdomen is soft.     Tenderness: There is no rebound.  Musculoskeletal:        General: No tenderness. Normal range of motion.     Right lower leg: No edema.     Left lower leg: No edema.  Lymphadenopathy:     Cervical: No cervical adenopathy.  Skin:    General: Skin is warm and dry.  Neurological:  Mental Status: He is alert and oriented to person, place, and time.     Cranial Nerves: No cranial nerve deficit.    No results found for this or any previous visit (from the past 48 hour(s)).  Labs:  No results found for: WBC, HGB, HCT, MCV, PLT No results for input(s): NA, K, CL, CO2, BUN, CREATININE, CALCIUM, PROT, BILITOT, ALKPHOS, ALT, AST, GLUCOSE in the last 168 hours.  Invalid input(s): LABALBU  Lipid Panel  No results found for: CHOL, TRIG, HDL, CHOLHDL, VLDL, LDLCALC  BNP (last 3 results) No results for input(s): BNP in the last 8760 hours.  HEMOGLOBIN A1C Lab Results  Component Value Date   HGBA1C 9.0 09/23/2015    Cardiac Panel  (last 3 results) No results for input(s): CKTOTAL, CKMB, RELINDX in the last 8760 hours.  Invalid input(s): TROPONINHS  No results found for: CKTOTAL, CKMB, CKMBINDEX   TSH No results for input(s): TSH in the last 8760 hours.   (Not in a hospital admission)     Current Facility-Administered Medications:    0.9 %  sodium chloride infusion, , Intravenous, Continuous, Dykstra, Ellwood Dense, MD   amiodarone (CORDARONE) 150 mg in dextrose 5 % 100 mL bolus, , Intravenous, Continuous PRN, Lucrezia Starch, MD, 150 mg at 10/02/21 1321   amiodarone (NEXTERONE) 1.8 mg/mL load via infusion 150 mg, 150 mg, Intravenous, Once **FOLLOWED BY** amiodarone (NEXTERONE PREMIX) 360-4.14 MG/200ML-% (1.8 mg/mL) IV infusion, 60 mg/hr, Intravenous, Continuous, Last Rate: 33.3 mL/hr at 10/02/21 1332, 60 mg/hr at 10/02/21 1332 **FOLLOWED BY** amiodarone (NEXTERONE PREMIX) 360-4.14 MG/200ML-% (1.8 mg/mL) IV infusion, 30 mg/hr, Intravenous, Continuous, Dykstra, Richard S, MD   magnesium sulfate IVPB 2 g 50 mL, 2 g, Intravenous, Once, Dykstra, Ellwood Dense, MD  Current Outpatient Medications:    amLODipine (NORVASC) 10 MG tablet, Take 1 tablet (10 mg total) by mouth daily., Disp: 90 tablet, Rfl: 1   ibuprofen (ADVIL,MOTRIN) 200 MG tablet, Take 200 mg by mouth every 6 (six) hours as needed for pain (headache)., Disp: , Rfl:    Today's Vitals   10/02/21 1328 10/02/21 1329 10/02/21 1330 10/02/21 1331  BP:   104/72   Resp: 13 15 (!) 23 16  Weight:      Height:       Body mass index is 31.2 kg/m.    CARDIAC STUDIES:  EKG 10/02/2021: Sinus tachycardia NSVT No short PR interval, per my read  Echocardiogram: Pending    Assessment/Plan  65 y.o. caucasian male  with hypertension, asthma, with unstable VF/VT.  VF/VT: No loss pf pulse, but multiple runs of VF/VT. No short PR interval while in sinus rhythm, per my read No obvious STEMI on EKG, but symptoms concerning for unstable angina. Given  constellation of symptoms, electrically silent, perhaps Lcx occlusion leading to unstable rhythm cannot be excluded. Continue IV amiodarone infusion. Will treat as probable unstable ACS at this time with Aspirin, heparin, urgent coronary angiography.   CRITICAL CARE Performed by: Vernell Leep   Total critical care time: 40 minutes   Critical care time was exclusive of separately billable procedures and treating other patients.   Critical care was necessary to treat or prevent imminent or life-threatening deterioration.   Critical care was time spent personally by me on the following activities: development of treatment plan with patient and/or surrogate as well as nursing, discussions with consultants, evaluation of patient's response to treatment, examination of patient, obtaining history from patient or surrogate, ordering and performing treatments and interventions, ordering  and review of laboratory studies, ordering and review of radiographic studies, pulse oximetry and re-evaluation of patient's condition.      Nigel Mormon, MD Pager: [phone] Office: [phone]

## 2021-10-02 NOTE — ED Provider Notes (Signed)
[facility] EMERGENCY DEPARTMENT Provider Note   CSN: [id number] Arrival date & time: 10/02/21  1316     History Chief Complaint  Patient presents with   Chest Pain   ventricular tachycardia    Daniel [name] is a 65 y.o. male.  Presenting to ER with concern for ventricular tachycardia.  History limited due to acuity.  Level 5 caveat.  EMS reports they were initially called for indigestion and chest pain.  Patient found to be in V. tach, initially self converted to sinus rhythm but then would have intermittent and then persistent ventricular tachycardia.  Received 3 total shocks prior to arrival.  Patient in V. tach on arrival in ER.  Patient states that he still has some chest discomfort and indigestion.  Denies any difficulty breathing.  He denies any history of any heart problems, does not have a cardiologist.  HPI     Past Medical History:  Diagnosis Date   Asthma    Hypertension     Patient Active Problem List   Diagnosis Date Noted   Ventricular tachycardia 10/02/2021   Ventricular fibrillation ([place]) 10/02/2021   Essential hypertension 10/07/2015   Type 2 diabetes mellitus without complication, without long-term current use of insulin ([place]) 10/07/2015   Other seasonal allergic rhinitis 10/05/2015   Essential hypertension, benign 01/13/2013    No past surgical history on file.     Family History  Problem Relation Age of Onset   Cancer Mother        unknown primary   Heart attack Father    Heart disease Father 57       AMI   Cancer Paternal Aunt     Social History   Tobacco Use   Smoking status: Never   Smokeless tobacco: Never  Substance Use Topics   Alcohol use: No   Drug use: No    Home Medications Prior to Admission medications   Medication Sig Start Date End Date Taking? Authorizing Provider  amLODipine (NORVASC) 10 MG tablet Take 1 tablet (10 mg total) by mouth daily. 09/07/15   Wardell Honour, MD  ibuprofen  (ADVIL,MOTRIN) 200 MG tablet Take 200 mg by mouth every 6 (six) hours as needed for pain (headache).    [provider]    Allergies    Patient has no known allergies.  Review of Systems   Review of Systems  Constitutional:  Negative for chills and fever.  HENT:  Negative for ear pain and sore throat.   Eyes:  Negative for pain and visual disturbance.  Respiratory:  Positive for chest tightness. Negative for cough and shortness of breath.   Cardiovascular:  Positive for chest pain. Negative for palpitations.  Gastrointestinal:  Negative for abdominal pain and vomiting.  Genitourinary:  Negative for dysuria and hematuria.  Musculoskeletal:  Negative for arthralgias and back pain.  Skin:  Negative for color change and rash.  Neurological:  Negative for seizures and syncope.  All other systems reviewed and are negative.  Physical Exam Updated Vital Signs BP 121/80    Pulse 76    Temp (!) 96.8 F (36 C) (Temporal)    Resp 14    Ht 6\' 6"  (1.981 m)    Wt 122.5 kg    SpO2 99%    BMI 31.20 kg/m   Physical Exam Vitals and nursing note reviewed.  Constitutional:      General: He is not in acute distress.    Appearance: He is well-developed.  Comments: Appears to be uncomfortable lying in stretcher, somewhat pale  HENT:     Head: Normocephalic and atraumatic.  Eyes:     Conjunctiva/sclera: Conjunctivae normal.  Cardiovascular:     Rate and Rhythm: Tachycardia present.     Heart sounds: No murmur heard. Pulmonary:     Effort: Pulmonary effort is normal. No respiratory distress.     Breath sounds: Normal breath sounds.  Abdominal:     Palpations: Abdomen is soft.     Tenderness: There is no abdominal tenderness.  Musculoskeletal:        General: No swelling.     Cervical back: Neck supple.  Skin:    General: Skin is warm and dry.     Capillary Refill: Capillary refill takes less than 2 seconds.  Neurological:     Mental Status: He is alert.  Psychiatric:         Mood and Affect: Mood normal.    ED Results / Procedures / Treatments   Labs (all labs ordered are listed, but only abnormal results are displayed) Labs Reviewed  CBC WITH DIFFERENTIAL/PLATELET - Abnormal; Notable for the following components:      Result Value   RBC 6.08 (*)    Hemoglobin 17.9 (*)    HCT 53.7 (*)    All other components within normal limits  HEMOGLOBIN A1C - Abnormal; Notable for the following components:   Hgb A1c MFr Bld 12.5 (*)    All other components within normal limits  COMPREHENSIVE METABOLIC PANEL - Abnormal; Notable for the following components:   Sodium 133 (*)    Potassium 3.0 (*)    Chloride 96 (*)    CO2 17 (*)    Glucose, Bld 422 (*)    Creatinine, Ser 1.40 (*)    AST 205 (*)    ALT 282 (*)    Total Bilirubin 2.2 (*)    GFR, Estimated 56 (*)    Anion gap 20 (*)    All other components within normal limits  LIPID PANEL - Abnormal; Notable for the following components:   Triglycerides 189 (*)    All other components within normal limits  I-STAT CHEM 8, ED - Abnormal; Notable for the following components:   Sodium 134 (*)    Potassium 3.1 (*)    Glucose, Bld 397 (*)    Calcium, Ion 1.03 (*)    TCO2 21 (*)    Hemoglobin 18.4 (*)    HCT 54.0 (*)    All other components within normal limits  TROPONIN I (HIGH SENSITIVITY) - Abnormal; Notable for the following components:   Troponin I (High Sensitivity) 227 (*)    All other components within normal limits  RESP PANEL BY RT-PCR (FLU A&B, COVID) ARPGX2  PROTIME-INR  LACTIC ACID, PLASMA  LACTIC ACID, PLASMA  BRAIN NATRIURETIC PEPTIDE  TYPE AND SCREEN  ABO/RH  TROPONIN I (HIGH SENSITIVITY)    EKG None  Radiology CARDIAC CATHETERIZATION  Result Date: 10/02/2021 LM: Normal LAD: No significant prox disease          Vessel tapers very rapidly after Diag 2          Mid to apical LAD is small caliber with 70% mid and 80% dital diffuse diseas          Diag 2 prox 60% disease Lcx: Mid focal  20% stenosis RCA: Very large, dominant vessel          PDA/PLA relatively smaller caliber after dRCA bifurcation  PDA 50% disease LVEDP 27 mHg LV gram: Moderate global hypokinesis with akinetic apex/anteroapex/inferior apex                LVEF 35-40% Multiple recurrent VF/VT in cath lab, one episode did not convert after cough/vagal maneuvers etc and required defibrillation I do not think this is ACS presentation, given the fact that, although disease, mid to distal LAD is patent and should not acutely cause akinetic apex Presentation of sustained VT/VF could be due ay of the following scenarios: 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 FINDINGS: Cardiomegaly. No confluent airspace opacities, effusions or edema. No acute bony abnormality. IMPRESSION: Cardiomegaly.  No active disease. Electronically Signed   By: Rolm Baptise M.D.   On: 10/02/2021 13:54    Procedures .Critical Care Performed by: Lucrezia Starch, MD Authorized by: Lucrezia Starch, MD   Critical care provider statement:    Critical care time (minutes):  36   Critical care was necessary to treat or prevent imminent or life-threatening deterioration of the following conditions:  Cardiac failure and circulatory failure   Critical care was time spent personally by me on the following activities:  Development of treatment plan with patient or surrogate, discussions with consultants, evaluation of patient's response to treatment, examination of patient, ordering and review of laboratory studies, ordering and review of radiographic studies,  ordering and performing treatments and interventions, pulse oximetry, re-evaluation of patient's condition and review of old charts   Medications Ordered in ED Medications  amiodarone (NEXTERONE) 1.8 mg/mL load via infusion 150 mg ( Intravenous MAR Unhold 10/02/21 1520)    Followed by  amiodarone (NEXTERONE PREMIX) 360-4.14 MG/200ML-% (1.8 mg/mL) IV infusion (60 mg/hr Intravenous Infusion Verify 10/02/21 1600)    Followed by  amiodarone (NEXTERONE PREMIX) 360-4.14 MG/200ML-% (1.8 mg/mL) IV infusion ( Intravenous MAR Unhold 10/02/21 1520)  amiodarone (CORDARONE) 150 mg in dextrose 5 % 100 mL bolus (150 mg Intravenous New Bag/Given 10/02/21 1321)  0.9 %  sodium chloride infusion ( Intravenous Infusion Verify 10/02/21 1600)  potassium chloride SA (KLOR-CON M) CR tablet 40 mEq (40 mEq Oral Given 10/02/21 1534)  lidocaine (cardiac) 2000 mg in dextrose 5% 500 mL (4mg /mL) IV infusion (1 mg/min Intravenous Infusion Verify 10/02/21 1600)  magnesium sulfate IVPB 4 g 100 mL ( Intravenous Infusion Verify 10/02/21 1600)  sodium chloride 0.9 % bolus 1,000 mL (1,000 mLs Intravenous New Bag/Given 10/02/21 1323)  aspirin chewable tablet 324 mg (324 mg Oral Given 10/02/21 1334)  heparin injection 4,000 Units (4,000 Units Intravenous Given 10/02/21 1334)  potassium chloride 10 mEq in 100 mL IVPB (10 mEq Intravenous New Bag/Given 10/02/21 1427)    ED Course  I have reviewed the triage vital signs and the nursing notes.  Pertinent labs & imaging results that were available during my care of the patient were reviewed by me and considered in my medical decision making (see chart for details).    MDM Rules/Calculators/A&P                         65 year old gentleman presenting to ER in ventricular tachycardia.  Multiple episodes  of V. tach prior to arrival and had been shocked 3 times prior to arrival but kept going back to ventricular tachycardia.  On arrival here, patient was found to be in ventricular  tachycardia with pulse and he was alert.  Given patient kept going back into ventricular tachycardia, attempted to treat medically with bolus of amiodarone followed by amiodarone drip.  Additionally ordered magnesium, fluids.  Consulted cardiology right away, Dr. Virgina Jock came to the bedside.  He recommended taking patient to Cath Lab to rule out underlying MI as cause for his arrhythmias.  Patient went from ER to Cath Lab.  Dr. Fraser Din RN will continue to manage.  Gave aspirin and heparin.  While in ER he continued to go between sinus rhythm and nonsustained ventricular tachycardia but was not persistent.  Did not require additional cardioversion in ER.    Final Clinical Impression(s) / ED Diagnoses Final diagnoses:  Ventricular tachycardia  ACS (acute coronary syndrome) [facility])    Rx / DC Orders ED Discharge Orders     None        Lucrezia Starch, MD 10/02/21 [phone]

## 2021-10-02 NOTE — ED Triage Notes (Signed)
Pt here via [facility] from home. Pt called for indigestion/cp. Ems found pt in vtach, pt 1st self-converted into NSR when EMS started an IV. Pt would be in NSR, have PVC's that would get more frequent, have some runs of vtach and then persistent vtach. Pt has received 3 shocks at 130J, all w/ conversion to NSR. Pt arrives in [place], aox4, 20g LAC. While in NSR BP 130/90, while in vtach pt would lose pulses and go pale.

## 2021-10-02 NOTE — ED Notes (Signed)
Pt's clothing (pants, shirt, shoes) and glasses given to pt's friend, Wallis Bamberg

## 2021-10-02 NOTE — Plan of Care (Signed)

## 2021-10-03 ENCOUNTER — Other Ambulatory Visit (HOSPITAL_COMMUNITY): Payer: Self-pay

## 2021-10-03 ENCOUNTER — Inpatient Hospital Stay (HOSPITAL_COMMUNITY): Payer: Medicaid Other

## 2021-10-03 ENCOUNTER — Encounter (HOSPITAL_COMMUNITY): Payer: Self-pay | Admitting: Cardiology

## 2021-10-03 DIAGNOSIS — I472 Ventricular tachycardia, unspecified: Secondary | ICD-10-CM | POA: Diagnosis present

## 2021-10-03 DIAGNOSIS — I4901 Ventricular fibrillation: Secondary | ICD-10-CM

## 2021-10-03 DIAGNOSIS — I493 Ventricular premature depolarization: Secondary | ICD-10-CM

## 2021-10-03 LAB — CBC
HCT: 43.5 % (ref 39.0–52.0)
Hemoglobin: 14.8 g/dL (ref 13.0–17.0)
MCH: 29.1 pg (ref 26.0–34.0)
MCHC: 34 g/dL (ref 30.0–36.0)
MCV: 85.5 fL (ref 80.0–100.0)
Platelets: 137 10*3/uL — ABNORMAL LOW (ref 150–400)
RBC: 5.09 MIL/uL (ref 4.22–5.81)
RDW: 12.7 % (ref 11.5–15.5)
WBC: 8.6 10*3/uL (ref 4.0–10.5)
nRBC: 0 % (ref 0.0–0.2)

## 2021-10-03 LAB — ECHOCARDIOGRAM COMPLETE
AR max vel: 2.63 cm2
AV Area VTI: 2.91 cm2
AV Area mean vel: 2.73 cm2
AV Mean grad: 3 mmHg
AV Peak grad: 5.7 mmHg
Ao pk vel: 1.19 m/s
Area-P 1/2: 4.31 cm2
Calc EF: 31.7 %
Height: 78 in
S' Lateral: 4.1 cm
Single Plane A2C EF: 33.5 %
Single Plane A4C EF: 32.6 %
Weight: 4158.76 oz

## 2021-10-03 LAB — BASIC METABOLIC PANEL
Anion gap: 10 (ref 5–15)
BUN: 20 mg/dL (ref 8–23)
CO2: 19 mmol/L — ABNORMAL LOW (ref 22–32)
Calcium: 8.7 mg/dL — ABNORMAL LOW (ref 8.9–10.3)
Chloride: 101 mmol/L (ref 98–111)
Creatinine, Ser: 1.05 mg/dL (ref 0.61–1.24)
GFR, Estimated: >60 mL/min (ref >60)
Glucose, Bld: 313 mg/dL — ABNORMAL HIGH (ref 70–99)
Potassium: 4.5 mmol/L (ref 3.5–5.1)
Sodium: 130 mmol/L — ABNORMAL LOW (ref 135–145)

## 2021-10-03 LAB — GLUCOSE, CAPILLARY
Glucose-Capillary: 368 mg/dL — ABNORMAL HIGH (ref 70–99)
Glucose-Capillary: 585 mg/dL (ref 70–99)
Glucose-Capillary: 342 mg/dL — ABNORMAL HIGH (ref 70–99)
Glucose-Capillary: 386 mg/dL — ABNORMAL HIGH (ref 70–99)
Glucose-Capillary: 258 mg/dL — ABNORMAL HIGH (ref 70–99)
Glucose-Capillary: 238 mg/dL — ABNORMAL HIGH (ref 70–99)

## 2021-10-03 LAB — TROPONIN I (HIGH SENSITIVITY): Troponin I (High Sensitivity): 7548 ng/L (ref <18)

## 2021-10-03 LAB — MAGNESIUM: Magnesium: 2.3 mg/dL (ref 1.7–2.4)

## 2021-10-03 LAB — HEPARIN LEVEL (UNFRACTIONATED)
Heparin Unfractionated: 0.15 IU/mL — ABNORMAL LOW (ref 0.30–0.70)
Heparin Unfractionated: 0.12 IU/mL — ABNORMAL LOW (ref 0.30–0.70)

## 2021-10-03 LAB — LIDOCAINE LEVEL: Lidocaine Lvl: 1.1 ug/mL — ABNORMAL LOW (ref 1.5–5.0)

## 2021-10-03 IMAGING — MR MR CARD MORPHOLOGY WO/W CM
45 of 48 series · 45 of 48 positions shown · IV contrast (Contrast agent)
Comparison: none

CLINICAL DATA: Symptomatic VT

EXAM:
CARDIAC MRI
TECHNIQUE: The patient was scanned on a 1.5 Tesla Siemens magnet. A dedicated
cardiac coil was used. Functional imaging was done using Fiesta
sequences. [DATE], and 4 chamber views were done to assess for RWMA's.
Modified NOBLES rule using a short axis stack was used to
calculate an ejection fraction on a dedicated work station using
Circle software. The patient received 10 cc of Gadavist. After 10
minutes inversion recovery sequences were used to assess for
infiltration and scar tissue.
CONTRAST:  Gadavist

[Series 4: [id number] · axial · 8.0mm · 1.60mm/px · 1 of 20 slices shown]
[im 1/20]
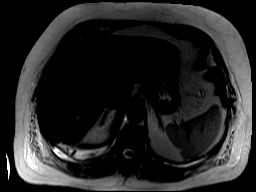

[Series 8: bSSFP · oblique · 8.0mm · 1.79mm/px · 1 of 25 slices shown (1 of 24)]
[im 1/25]
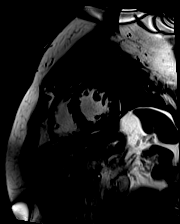

[Series 9: bSSFP · oblique · 8.0mm · 1.79mm/px · 1 of 25 slices shown (2 of 24)]
[im 1/25]
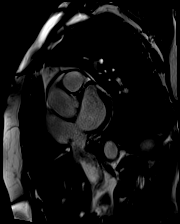

[Series 10: bSSFP · oblique · 8.0mm · 1.79mm/px · 1 of 25 slices shown (3 of 24)]
[im 1/25]
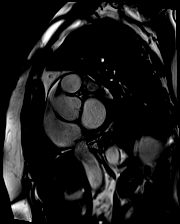

[Series 11: bSSFP · oblique · 8.0mm · 1.79mm/px · 1 of 25 slices shown (4 of 24)]
[im 1/25]
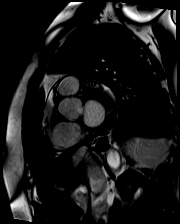

[Series 12: bSSFP · oblique · 8.0mm · 1.79mm/px · 1 of 25 slices shown (5 of 24)]
[im 1/25]
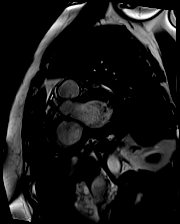

[Series 13: bSSFP · oblique · 8.0mm · 1.79mm/px · 1 of 25 slices shown (6 of 24)]
[im 1/25]
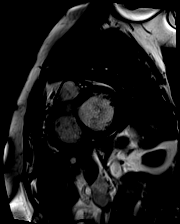

[Series 14: bSSFP · oblique · 8.0mm · 1.79mm/px · 1 of 25 slices shown (7 of 24)]
[im 1/25]
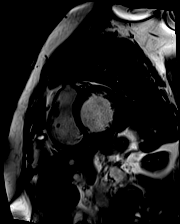

[Series 15: bSSFP · oblique · 8.0mm · 1.79mm/px · 1 of 25 slices shown (8 of 24)]
[im 1/25]
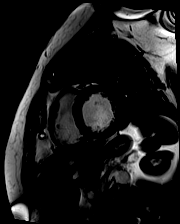

[Series 16: bSSFP · oblique · 8.0mm · 1.79mm/px · 1 of 25 slices shown (9 of 24)]
[im 1/25]
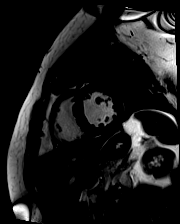

[Series 17: bSSFP · oblique · 8.0mm · 1.79mm/px · 1 of 25 slices shown (10 of 24)]
[im 1/25]
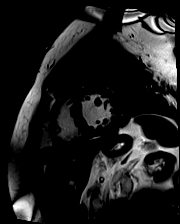

[Series 18: bSSFP · oblique · 8.0mm · 1.79mm/px · 1 of 25 slices shown (11 of 24)]
[im 1/25]
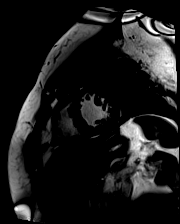

[Series 19: bSSFP · oblique · 8.0mm · 1.79mm/px · 1 of 25 slices shown (12 of 24)]
[im 1/25]
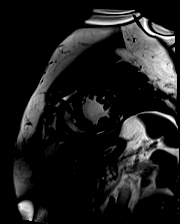

[Series 20: bSSFP · oblique · 8.0mm · 1.79mm/px · 1 of 25 slices shown (13 of 24)]
[im 1/25]
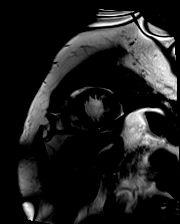

[Series 21: bSSFP · oblique · 8.0mm · 1.79mm/px · 1 of 25 slices shown (14 of 24)]
[im 1/25]
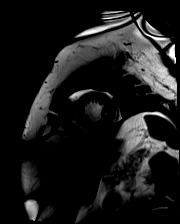

[Series 22: bSSFP · oblique · 8.0mm · 1.79mm/px · 1 of 25 slices shown (15 of 24)]
[im 1/25]
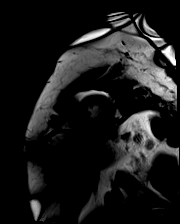

[Series 23: bSSFP · oblique · 8.0mm · 1.79mm/px · 1 of 25 slices shown (16 of 24)]
[im 1/25]
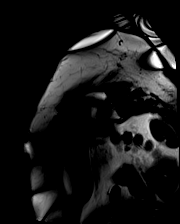

[Series 24: bSSFP · oblique · 8.0mm · 1.79mm/px · 1 of 25 slices shown (17 of 24)]
[im 1/25]
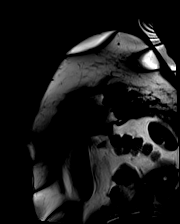

[Series 25: bSSFP · oblique · 8.0mm · 1.79mm/px · 1 of 25 slices shown (18 of 24)]
[im 1/25]
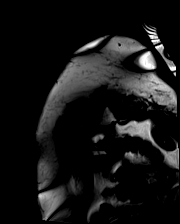

[Series 26: bSSFP · oblique · 8.0mm · 1.79mm/px · 1 of 25 slices shown (19 of 24)]
[im 1/25]
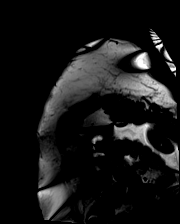

[Series 27: bSSFP · oblique · 8.0mm · 1.79mm/px · 1 of 25 slices shown (20 of 24)]
[im 1/25]
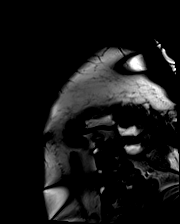

[Series 28: bSSFP · oblique · 6.0mm · 1.41mm/px · 1 of 25 slices shown (21 of 24)]
[im 1/25]
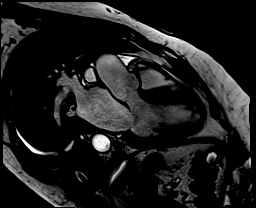

[Series 29: bSSFP · oblique · 6.0mm · 1.41mm/px · 1 of 25 slices shown (22 of 24)]
[im 1/25]
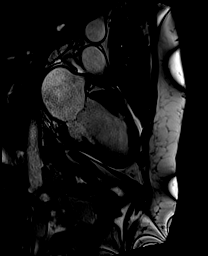

[Series 30: bSSFP · axial · 6.0mm · 1.41mm/px · 1 of 25 slices shown (23 of 24)]
[im 1/25]
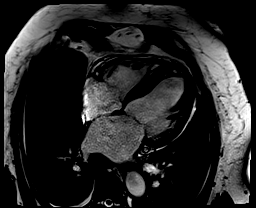

[Series 32: STIR · axial · 6.0mm · 1.92mm/px · 1 of 1 slices shown]
[im 1/1]
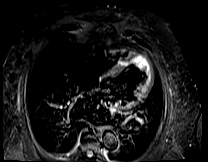

[Series 34: (id)_long_t1 · oblique · 8.0mm · 1.56mm/px · 1 of 24 slices shown]
[im 1/24]
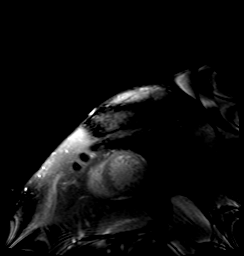

[Series 35: (id)_long_t1_moco · oblique · 8.0mm · 1.56mm/px · 1 of 24 slices shown]
[im 1/24]
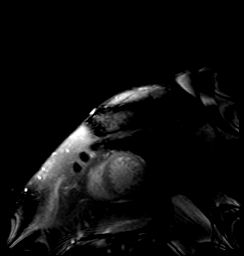

[Series 36: (id)_long_t1_moco_t1 · oblique · 8.0mm · 1.56mm/px · 1 of 6 slices shown]
[im 1/6]
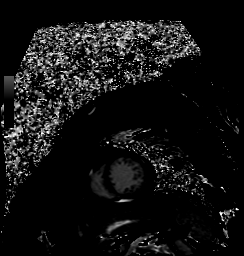

[Series 38: (id)_trufi · oblique · 8.0mm · 2.08mm/px · 1 of 9 slices shown]
[im 1/9]
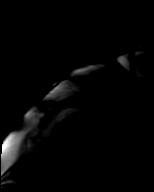

[Series 39: (id)_trufi_moco · oblique · 8.0mm · 2.08mm/px · 1 of 9 slices shown]
[im 1/9]
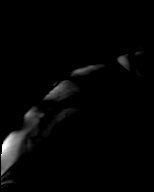

[Series 42: bSSFP · coronal · 6.0mm · 1.41mm/px · 1 of 25 slices shown (24 of 24)]
[im 1/25]
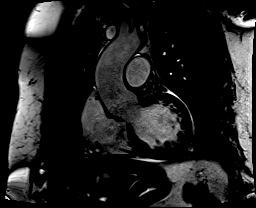

[Series 43: cine rvot · sagittal · 6.0mm · 1.41mm/px · 1 of 25 slices shown]
[im 1/25]
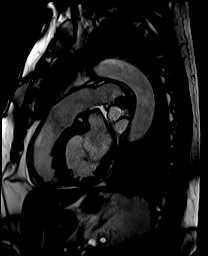

[Series 44: aortic valve cine · oblique · 6.0mm · 1.41mm/px · 1 of 25 slices shown]
[im 1/25]
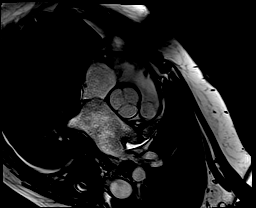

[Series 45: pre short axis · oblique · non-contrast · 8.0mm · 2.25mm/px · 1 of 10 slices shown (1 of 6)]
[im 1/10]
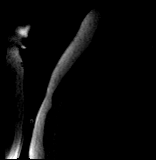

[Series 46: pre short axis · oblique · non-contrast · 8.0mm · 2.25mm/px · 1 of 10 slices shown (2 of 6)]
[im 1/10]
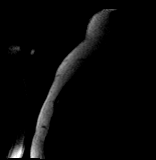

[Series 47: pre short axis · oblique · non-contrast · 8.0mm · 2.25mm/px · 1 of 10 slices shown (3 of 6)]
[im 1/10]
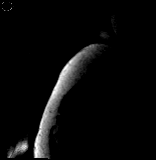

[Series 48: pre short axis · oblique · non-contrast · 8.0mm · 2.25mm/px · 1 of 10 slices shown (4 of 6)]
[im 1/10]
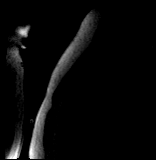

[Series 49: pre short axis · oblique · non-contrast · 8.0mm · 2.25mm/px · 1 of 10 slices shown (5 of 6)]
[im 1/10]
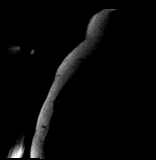

[Series 50: pre short axis · oblique · non-contrast · 8.0mm · 2.25mm/px · 1 of 10 slices shown (6 of 6)]
[im 1/10]
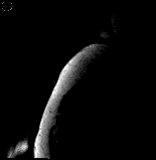

[Series 51: rest short axis · oblique · 8.0mm · 2.25mm/px · 1 of 60 slices shown (1 of 6)]
[im 1/60]
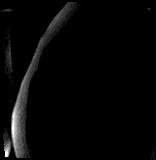

[Series 52: rest short axis · oblique · 8.0mm · 2.25mm/px · 1 of 60 slices shown (2 of 6)]
[im 1/60]
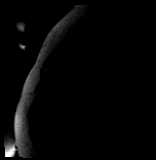

[Series 53: rest short axis · oblique · 8.0mm · 2.25mm/px · 1 of 60 slices shown (3 of 6)]
[im 1/60]
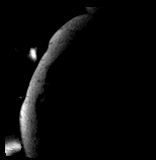

[Series 54: rest short axis · oblique · 8.0mm · 2.25mm/px · 1 of 60 slices shown (4 of 6)]
[im 1/60]
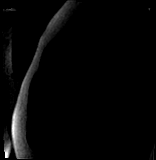

[Series 55: rest short axis · oblique · 8.0mm · 2.25mm/px · 1 of 60 slices shown (5 of 6)]
[im 1/60]
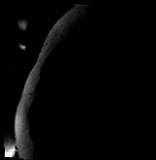

[Series 56: rest short axis · oblique · 8.0mm · 2.25mm/px · 1 of 60 slices shown (6 of 6)]
[im 1/60]
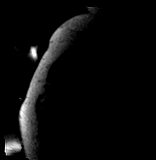

[45 of 48 positions shown; findings below may reference images not displayed]

FINDINGS: Moderate LAE. Normal RA. No ASD/PFO. Small pericardial effusion.
Normal MV, AV (tri leaflet), TV. Mild aortic root enlargement 4.0 cm
Normal RVOT and PV The LV is moderately dilated. There is severe
hypokinesis of the mid/ distal anterior wall, septum, apex and
inferior apex The quantitative EF is 36% (EDV 278 cc ESV 177 cc SV
102 cc) Normal RV size and function RVEF 56% (EDV 179 cc ESV 79 cc
SV 100 cc) No diverticula or evidence of RV dysplasia

Septum is thickened at 14 mm with elevated ECV in this area 48%

T2 in septal area also elevated at 46%

Post gadolinium images sub optimal but suggest more diffuse failure
to null and sub-endocardial to mid myocardial uptake in the septum
and anterior wall
IMPRESSION: 1. Moderate LVE with mid/apical anterior wall, septal true apex and
inferior apical wall motion. Quantitative EF 36%.

2. Diffuse sub endocardial and mid myocardial uptake particularly in
the septum and mid/basal anterior wall not in coronary distribution

3. Elevated ECV and T2 along with gadolinium uptake suggests
myocarditis

4.  Normal RV size and function RVEF 56% no evidence of RV dysplasia

5.  Small pericardial effusion

6.  Dilated aortic root 4.0 cm

NOBLES

## 2021-10-03 MED ORDER — METOPROLOL SUCCINATE ER 25 MG PO TB24
12.5000 mg | ORAL_TABLET | Freq: Every day | ORAL | Status: DC
  Administered 2021-10-03 – 2021-10-05 (×3): 12.5 mg via ORAL
  Filled 2021-10-03 (×3): qty 1

## 2021-10-03 MED ORDER — PERFLUTREN LIPID MICROSPHERE
1.0000 mL | INTRAVENOUS | Status: AC | PRN
  Administered 2021-10-03: 13:00:00 2 mL via INTRAVENOUS
  Filled 2021-10-03: qty 10

## 2021-10-03 MED ORDER — GADOBUTROL 1 MMOL/ML IV SOLN
10.0000 mL | Freq: Once | INTRAVENOUS | Status: AC | PRN
  Administered 2021-10-03: 11:00:00 10 mL via INTRAVENOUS

## 2021-10-03 MED ORDER — INSULIN GLARGINE-YFGN 100 UNIT/ML ~~LOC~~ SOLN
18.0000 [IU] | Freq: Every day | SUBCUTANEOUS | Status: DC
Start: 2021-10-03 — End: 2021-10-04
  Administered 2021-10-03 – 2021-10-04 (×2): 18 [IU] via SUBCUTANEOUS
  Filled 2021-10-03 (×3): qty 0.18

## 2021-10-03 MED ORDER — ENOXAPARIN SODIUM 40 MG/0.4ML IJ SOSY
40.0000 mg | PREFILLED_SYRINGE | INTRAMUSCULAR | Status: DC
  Administered 2021-10-03 – 2021-10-04 (×2): 40 mg via SUBCUTANEOUS
  Filled 2021-10-03 (×2): qty 0.4

## 2021-10-03 MED ORDER — LIVING WELL WITH DIABETES BOOK
Freq: Once | Status: AC
  Filled 2021-10-03: qty 1

## 2021-10-03 MED ORDER — INSULIN STARTER KIT- PEN NEEDLES (ENGLISH)
1.0000 | Freq: Once | Status: AC
  Administered 2021-10-03: 15:00:00 1
  Filled 2021-10-03: qty 1

## 2021-10-03 NOTE — Progress Notes (Signed)
Subjective:  Feels well No complaints  VT very well controlled with IV amio and lidocaine Cardiac MRI showed myocarditis, details below  Blood sugars have been elevated  Objective:  Vital Signs in the last 24 hours: Temp:  [96.8 F (36 C)-98.6 F (37 C)] 98.6 F (37 C) (12/27 0744) Pulse Rate:  [0-232] 73 (12/27 1033) Resp:  [0-60] 18 (12/27 0905) BP: (85-141)/(57-124) 100/70 (12/27 1033) SpO2:  [0 %-100 %] 98 % (12/27 1033) Weight:  [117.9 kg-122.5 kg] 117.9 kg (12/27 0600)  Intake/Output from previous day: 12/26 0701 - 12/27 0700 In: 1162.8 [P.O.:360; I.V.:705.5; IV Piggyback:97.3] Out: 900 [Urine:900]  Physical Exam Vitals and nursing note reviewed.  Constitutional:      General: He is not in acute distress.    Appearance: He is well-developed.  HENT:     Head: Normocephalic and atraumatic.  Eyes:     Conjunctiva/sclera: Conjunctivae normal.     Pupils: Pupils are equal, round, and reactive to light.  Neck:     Vascular: No JVD.  Cardiovascular:     Rate and Rhythm: Normal rate and regular rhythm.     Pulses: Normal pulses and intact distal pulses.     Heart sounds: No murmur heard. Pulmonary:     Effort: Pulmonary effort is normal.     Breath sounds: Normal breath sounds. No wheezing or rales.  Abdominal:     General: Bowel sounds are normal.     Palpations: Abdomen is soft.     Tenderness: There is no rebound.  Musculoskeletal:        General: No tenderness. Normal range of motion.     Right lower leg: No edema.     Left lower leg: No edema.  Lymphadenopathy:     Cervical: No cervical adenopathy.  Skin:    General: Skin is warm and dry.  Neurological:     Mental Status: He is alert and oriented to person, place, and time.     Cranial Nerves: No cranial nerve deficit.     Lab Results: BMP Recent Labs    10/02/21 1323 10/02/21 1334  NA 133* 134*  K 3.0* 3.1*  CL 96* 98  CO2 17*  --   GLUCOSE 422* 397*  BUN 14 16  CREATININE 1.40* 1.10   CALCIUM 9.1  --   GFRNONAA 56*  --     CBC Recent Labs  Lab 10/02/21 1323 10/02/21 1334 10/03/21 0610  WBC 10.1  --  8.6  RBC 6.08*  --  5.09  HGB 17.9*   < > 14.8  HCT 53.7*   < > 43.5  PLT 166  --  137*  MCV 88.3  --  85.5  MCH 29.4  --  29.1  MCHC 33.3  --  34.0  RDW 12.4  --  12.7  LYMPHSABS 1.9  --   --   MONOABS 0.5  --   --   EOSABS 0.0  --   --   BASOSABS 0.0  --   --    < > = values in this interval not displayed.    HEMOGLOBIN A1C Lab Results  Component Value Date   HGBA1C 12.5 (H) 10/02/2021   MPG 312.05 10/02/2021    Cardiac Panel (last 3 results) No results for input(s): CKTOTAL, CKMB, TROPONINI, RELINDX in the last 8760 hours.  BNP (last 3 results) Recent Labs    10/02/21 1639  BNP 216.6*     Lipid Panel     Component  Value Date/Time   CHOL 180 10/02/2021 1327   TRIG 189 (H) 10/02/2021 1327   HDL 48 10/02/2021 1327   CHOLHDL 3.8 10/02/2021 1327   VLDL 38 10/02/2021 1327   LDLCALC 94 10/02/2021 1327     Hepatic Function Panel Recent Labs    10/02/21 1323  PROT 6.7  ALBUMIN 4.0  AST 205*  ALT 282*  ALKPHOS 92  BILITOT 2.2*     Cardiac Studies:  EKG 10/03/2021: Sinus rhythm Left intrafascicular block Prolonged QT interval  Cardiac MRI 10/03/2021: 1. Moderate LVE with mid/apical anterior wall, septal true apex and inferior apical wall motion. Quantitative EF 36%.  2. Diffuse sub endocardial and mid myocardial uptake particularly in the septum and mid/basal anterior wall not in coronary distribution  3. Elevated ECV and T2 along with gadolinium uptake suggests myocarditis  4.  Normal RV size and function RVEF 56% no evidence of RV dysplasia  5.  Small pericardial effusion  6.  Dilated aortic root 4.0 cm  Echocardiogram 10/03/2021: 1. Septal apical distal anterior and inferior wall hypokinesis . Left  ventricular ejection fraction, by estimation, is 35%. The left ventricle  has normal function. The left ventricle  demonstrates regional wall motion  abnormalities (see scoring  diagram/findings for description). The left ventricular internal cavity  size was moderately dilated. There is moderate left ventricular  hypertrophy. Left ventricular diastolic parameters were normal.   2. Right ventricular systolic function is normal. The right ventricular  size is normal.   3. Left atrial size was moderately dilated.   4. Right atrial size was moderately dilated.   5. The mitral valve is abnormal. Trivial mitral valve regurgitation. No  evidence of mitral stenosis.   6. The aortic valve is tricuspid. Aortic valve regurgitation is not  visualized. No aortic stenosis is present.   7. Aortic dilatation noted. There is mild dilatation of the aortic root,  measuring 41 mm.   8. The inferior vena cava is dilated in size with >50% respiratory  variability, suggesting right atrial pressure of 8 mmHg.    Assessment & Recommendations:  65 y.o. caucasian male  with hypertension, asthma, neurotmesis of uncontrolled type 2 diabetes mellitus, admitted with unstable VF/VT.   VF/VT: Stabilized on IV amiodarone and IV lidocaine. Coronary angiogram with no culprit severe disease. Cardiac MRI consistent with myocarditis. Continue supportive care.  Added Entresto 24 to 26 mg twice daily, metoprolol succinate 12.5 mg daily. Appreciate EP consult regarding antiarrhythmic therapy. Most likely will need LifeVest.  Type 2 diabetes mellitus: A1c 12%.  New diagnosis. Adjusting insulin dosing.  Hypertension: Controlled  If able to switch from IV lidocaine to oral therapy, perhaps mexiletine, we will move him to progressive minute.    Elder Negus, MD Pager: [phone] Office: [phone]

## 2021-10-03 NOTE — Progress Notes (Signed)
Patient in MRI. Will check back later.

## 2021-10-03 NOTE — TOC Benefit Eligibility Note (Signed)
Patient Advocate Encounter  Insurance verification completed.    The patient is uninsured  Jamie-Lee Galdamez, CPhT Pharmacy Patient Advocate Specialist [facility] Patient Advocate Team Direct Number: [phone]  Fax: [phone]        

## 2021-10-03 NOTE — Progress Notes (Signed)
ANTICOAGULATION CONSULT NOTE   Pharmacy Consult for IV Heparin Indication: chest pain/ACS   No Known Allergies  Patient Measurements: Height: 6\' 6"  (198.1 cm) Weight: 117.9 kg (259 lb 14.8 oz) IBW/kg (Calculated) : 91.4 Heparin Dosing Weight: 116.7 kg  Vital Signs: Temp: 98.6 F (37 C) (12/27 0744) Temp Source: Oral (12/27 0744) BP: 117/80 (12/27 0950) Pulse Rate: 84 (12/27 0950)  Labs: Recent Labs    10/02/21 1323 10/02/21 1334 10/02/21 1639 10/03/21 0610  HGB 17.9* 18.4*  --  14.8  HCT 53.7* 54.0*  --  43.5  PLT 166  --   --  137*  LABPROT 13.0  --   --   --   INR 1.0  --   --   --   HEPARINUNFRC  --   --   --  0.15*  CREATININE 1.40* 1.10  --   --   TROPONINIHS 227*  --  4,978*  --      Estimated Creatinine Clearance: 99.2 mL/min (by C-G formula based on SCr of 1.1 mg/dL).   Medical History: Past Medical History:  Diagnosis Date   Asthma    Hypertension     Medications:  Scheduled:   amiodarone  150 mg Intravenous Once   Chlorhexidine Gluconate Cloth  6 each Topical Daily   insulin aspart  0-15 Units Subcutaneous TID WC   insulin aspart  0-5 Units Subcutaneous QHS   insulin aspart  4 Units Subcutaneous TID WC   metoprolol succinate  12.5 mg Oral Daily   sacubitril-valsartan  1 tablet Oral BID   Infusions:   sodium chloride Stopped (10/03/21 0624)   amiodarone (NEXTERONE) IV bolus only 150 mg/100 mL     amiodarone 30 mg/hr (10/03/21 0900)   heparin 1,600 Units/hr (10/03/21 0900)   lidocaine 1 mg/min (10/03/21 0900)    Assessment: 65 years of age male status post cardiac cath with moderate disease and 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 Post-cath,  Dr 66 has consulted pharmacy to start IV Heparin for treatment of ACS and as discussed with delay start until 4 hours after TR band has been removed. Patient was not on anticoagulation prior to this admission.   Heparin level this morning below goal at 0.15.  No known issues with IV  infusion, no overt bleeding or complications noted.  Goal of Therapy:  Heparin level 0.3-0.7 units/ml Monitor platelets by anticoagulation protocol: Yes   Plan:  Increase IV heparin to 1600 units/hr. Will check Heparin level in 6 hours.  Will monitor daily Heparin level and CBC while on therapy.   Rosemary Holms, Reece Leader, BCCP Clinical Pharmacist  10/03/2021 10:21 AM   [facility] pharmacy phone numbers are listed on [email or web address]

## 2021-10-03 NOTE — Consult Note (Signed)
ELECTROPHYSIOLOGY CONSULT NOTE    Patient ID: Daniel [name] MRN: [id number], DOB/AGE: 11-05-1955 65 y.o.  Admit date: 10/02/2021 Date of Consult: 10/03/2021  Primary Physician: Patient, No Pcp Per (Inactive) Primary Cardiologist: None  Electrophysiologist:  New  Referring Provider: Dr. Virgina Jock  Patient Profile: Daniel [name] is a 65 y.o. male with a history of HTN and asthma who is being seen today for the evaluation of unstable VF/VT at the request of Dr. Virgina Jock.  HPI:  Daniel [name] is a 65 y.o. male with medical history as above.   Pt called 911 12/26 with feelings of indigestion and difficulty breathing starting around 11 am. Pt found to be in hemodynamically stable VT on EMS arrival. Shocked 4 times and bolused amiodarone. Continued to have symptoms and intermittent runs of VF/VT  Pertinent labs on admission included K 3.0, Cr 1.40.   Labs today show K 4.5, Cr 1.05, Mg 2.3, HS trop 7548  LHC today by Dr. Virgina Jock shows LM: Normal LAD: No significant prox disease          Vessel tapers very rapidly after Diag 2          Mid to apical LAD is small caliber with 70% mid and 80% dital diffuse diseas          Diag 2 prox 60% disease Lcx: Mid focal 20% stenosis RCA: Very large, dominant vessel           PDA/PLA relatively smaller caliber after dRCA bifurcation          PDA 50% disease  Had recurrent VT in lab requiring defib.   cMRI 10/03/2021 shows: 1. Moderate LVE with mid/apical anterior wall, septal true apex and inferior apical wall motion. Quantitative EF 36%. 2. Diffuse sub endocardial and mid myocardial uptake particularly in the septum and mid/basal anterior wall not in coronary distribution 3. Elevated ECV and T2 along with gadolinium uptake suggests myocarditis 4.  Normal RV size and function RVEF 56% no evidence of RV dysplasia 5.  Small pericardial effusion 6.  Dilated aortic root 4.0 cm  Currently, he is feeling OK. He complains of feeling slightly  SOB, but not overtly. Just something feels "off". Denies chest pain or palpitations currently.   Past Medical History:  Diagnosis Date   Asthma    Hypertension      Surgical History:  Past Surgical History:  Procedure Laterality Date   LEFT HEART CATH AND CORONARY ANGIOGRAPHY N/A 10/02/2021   Procedure: LEFT HEART CATH AND CORONARY ANGIOGRAPHY;  Surgeon: Nigel Mormon, MD;  Location: [place] [facility];  Service: Cardiovascular;  Laterality: N/A;     Medications Prior to Admission  Medication Sig Dispense Refill Last Dose   aspirin 325 MG tablet Take 325 mg by mouth daily.   Past Week   Multiple Vitamin (MULTIVITAMIN WITH MINERALS) TABS tablet Take 1 tablet by mouth daily.   Past Week   omega-3 acid ethyl esters (LOVAZA) 1 g capsule Take 1 g by mouth daily.   Past Week   simethicone (MYLICON) 086 MG chewable tablet Chew 125 mg by mouth every 6 (six) hours as needed for flatulence.   Past Week   amLODipine (NORVASC) 10 MG tablet Take 1 tablet (10 mg total) by mouth daily. (Patient not taking: Reported on 10/02/2021) 90 tablet 1 Not Taking   ibuprofen (ADVIL,MOTRIN) 200 MG tablet Take 200 mg by mouth every 6 (six) hours as needed for pain (headache). (Patient not taking: Reported on 10/02/2021)  Not Taking    Inpatient Medications:   amiodarone  150 mg Intravenous Once   Chlorhexidine Gluconate Cloth  6 each Topical Daily   insulin aspart  0-15 Units Subcutaneous TID WC   insulin aspart  0-5 Units Subcutaneous QHS   insulin aspart  4 Units Subcutaneous TID WC   insulin starter kit- pen needles  1 kit Other Once   living well with diabetes book   Does not apply Once   metoprolol succinate  12.5 mg Oral Daily   sacubitril-valsartan  1 tablet Oral BID    Allergies: No Known Allergies  Social History   Socioeconomic History   Marital status: Unknown    Spouse name: Not on file   Number of children: Not on file   Years of education: Not on file   Highest education  level: Not on file  Occupational History   Not on file  Tobacco Use   Smoking status: Never   Smokeless tobacco: Never  Substance and Sexual Activity   Alcohol use: No   Drug use: No   Sexual activity: Never  Other Topics Concern   Not on file  Social History Narrative   Marital status: single; not dating      Children: none      Lives: alone      Employment:  Maintenance/production      Tobacco:  None;       Alcohol: none      Drugs: none        Social Determinants of Radio broadcast assistant Strain: Not on file  Food Insecurity: Not on file  Transportation Needs: Not on file  Physical Activity: Not on file  Stress: Not on file  Social Connections: Not on file  Intimate Partner Violence: Not on file     Family History  Problem Relation Age of Onset   Cancer Mother        unknown primary   Heart attack Father    Heart disease Father 53       AMI   Cancer Paternal Aunt      Review of Systems: All other systems reviewed and are otherwise negative except as noted above.  Physical Exam: Vitals:   10/03/21 0950 10/03/21 1033 10/03/21 1105 10/03/21 1200  BP: 117/80 100/70 103/80 127/81  Pulse: 84 73 77 83  Resp:    16  Temp:      TempSrc:      SpO2: 98% 98% 100% 98%  Weight:      Height:        GEN- The patient is well appearing, alert and oriented x 3 today.   HEENT: normocephalic, atraumatic; sclera clear, conjunctiva pink; hearing intact; oropharynx clear; neck supple Lungs- Clear to ausculation bilaterally, normal work of breathing.  No wheezes, rales, rhonchi Heart- Regular rate and rhythm, no murmurs, rubs or gallops GI- soft, non-tender, non-distended, bowel sounds present Extremities- no clubbing, cyanosis, or edema; DP/PT/radial pulses 2+ bilaterally MS- no significant deformity or atrophy Skin- warm and dry, no rash or lesion Psych- euthymic mood, full affect Neuro- strength and sensation are intact  Labs:   Lab Results  Component Value  Date   WBC 8.6 10/03/2021   HGB 14.8 10/03/2021   HCT 43.5 10/03/2021   MCV 85.5 10/03/2021   PLT 137 (L) 10/03/2021    Recent Labs  Lab 10/02/21 1323 10/02/21 1334 10/03/21 0610  NA 133*   < > 130*  K 3.0*   < >  4.5  CL 96*   < > 101  CO2 17*  --  19*  BUN 14   < > 20  CREATININE 1.40*   < > 1.05  CALCIUM 9.1  --  8.7*  PROT 6.7  --   --   BILITOT 2.2*  --   --   ALKPHOS 92  --   --   ALT 282*  --   --   AST 205*  --   --   GLUCOSE 422*   < > 313*   < > = values in this interval not displayed.      Radiology/Studies: CARDIAC CATHETERIZATION  Result Date: 10/02/2021 LM: Normal LAD: No significant prox disease          Vessel tapers very rapidly after Diag 2          Mid to apical LAD is small caliber with 70% mid and 80% dital diffuse diseas          Diag 2 prox 60% disease Lcx: Mid focal 20% stenosis RCA: Very large, dominant vessel          PDA/PLA relatively smaller caliber after dRCA bifurcation          PDA 50% disease LVEDP 27 mHg LV gram: Moderate global hypokinesis with akinetic apex/anteroapex/inferior apex                LVEF 35-40% Multiple recurrent VF/VT in cath lab, one episode did not convert after cough/vagal maneuvers etc and required defibrillation I do not think this is ACS presentation, given the fact that, although disease, mid to distal LAD is patent and should not acutely cause akinetic apex Presentation of sustained VT/VF could be due ay of the following scenarios: 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 FINDINGS: Cardiomegaly. No confluent airspace opacities, effusions  or edema. No acute bony abnormality. IMPRESSION: Cardiomegaly.  No active disease. Electronically Signed   By: Rolm Baptise M.D.   On: 10/02/2021 13:54   MR CARDIAC MORPHOLOGY W WO CONTRAST  Result Date: 10/03/2021 CLINICAL DATA:  Symptomatic VT EXAM: CARDIAC MRI TECHNIQUE: The patient was scanned on a 1.5 Tesla Siemens magnet. A dedicated cardiac coil was used. Functional imaging was done using Fiesta sequences. 2,3, and 4 chamber views were done to assess for RWMA's. Modified Simpson's rule using a short axis stack was used to calculate an ejection fraction on a dedicated work Conservation officer, nature. The patient received 10 cc of Gadavist. After 10 minutes inversion recovery sequences were used to assess for infiltration and scar tissue. CONTRAST:  Gadavist FINDINGS: Moderate LAE. Normal RA. No ASD/PFO. Small pericardial effusion. Normal MV, AV (tri leaflet), TV. Mild aortic root enlargement 4.0 cm Normal RVOT and PV The LV is moderately dilated. There is severe hypokinesis of the mid/ distal anterior wall, septum, apex and inferior apex The quantitative EF is 36% (EDV 278 cc ESV 177 cc SV 102 cc) Normal RV size and function RVEF 56% (EDV 179 cc ESV 79 cc SV 100 cc) No diverticula or evidence of RV dysplasia Septum is thickened at 14 mm with elevated ECV in this area 48%  T2 in septal area also elevated at 46% Post gadolinium images sub optimal but suggest more diffuse failure to null and sub-endocardial to mid myocardial uptake in the septum and anterior wall IMPRESSION: 1. Moderate LVE with mid/apical anterior wall, septal true apex and inferior apical wall motion. Quantitative EF 36%. 2. Diffuse sub endocardial and mid myocardial uptake particularly in the septum and mid/basal anterior wall not in coronary distribution 3. Elevated ECV and T2 along with gadolinium uptake suggests myocarditis 4.  Normal RV size and function RVEF 56% no evidence of RV dysplasia 5.  Small pericardial effusion 6.  Dilated  aortic root 4.0 cm Jenkins Rouge Electronically Signed   By: Jenkins Rouge M.D.   On: 10/03/2021 12:00    EKG: on arrival shows NSR with frequent ventricular ectopy and NSVT (personally reviewed)  TELEMETRY: NSR 80s, VT appears monomorphic (personally reviewed)  Assessment/Plan: 1.  Recurrent hemodynamically stable VT ? In setting of likely endocarditis by cMRI today On IV amiodarone and lidocaine Will discuss timing of po amiodarone and to Mexitil pending quiescence. Likely IV amio at least today with VT in lab.  Ventricular ectopy on EKG on admission appears to have at least 2 if not 3 foci   2. Acute systolic CHF EF 11% by cMRI Will need GDMT as tolerated Suspect will need lifevest for secondary prevention and repeat cMRI in 3-4 months to see if scar improves or if will ultimately require ICD.   3. ? Myocarditis Would involve HF team for assistance or if has treatable etiology.  Dr. Quentin Ore has seen.   For questions or updates, please contact [place] Please consult [email or web address] for contact info under Cardiology/STEMI.  Jacalyn Lefevre, PA-C  10/03/2021 12:44 PM

## 2021-10-03 NOTE — TOC Initial Note (Addendum)
Transition of Care [facility]) - Initial/Assessment Note    Patient Details  Name: Daniel [name] MRN: [id number] Date of Birth: January 28, 1956  [facility]) CM/SW Contact:    Lawerance Sabal, RN Phone Number: 10/03/2021, 2:54 PM  Clinical Narrative:     Patient screened by TOC. Requested CMA to schedule at [facility] to establish PCP. Will follow for medication needs and MATCH at DC. Please send DC meds through [facility] pharmacy               Expected Discharge Plan: Home/Self Care Barriers to Discharge: Continued Medical Work up   Patient Goals and CMS Choice        Expected Discharge Plan and Services Expected Discharge Plan: Home/Self Care                                              Prior Living Arrangements/Services                       Activities of Daily Living Home Assistive Devices/Equipment: None ADL Screening (condition at time of admission) Patient's cognitive ability adequate to safely complete daily activities?: Yes Is the patient deaf or have difficulty hearing?: No Does the patient have difficulty seeing, even when wearing glasses/contacts?: No Does the patient have difficulty concentrating, remembering, or making decisions?: No Patient able to express need for assistance with ADLs?: Yes Does the patient have difficulty dressing or bathing?: No Independently performs ADLs?: Yes (appropriate for developmental age) Does the patient have difficulty walking or climbing stairs?: No Weakness of Legs: None Weakness of Arms/Hands: None  Permission Sought/Granted                  Emotional Assessment              Admission diagnosis:  Ventricular tachycardia [I47.20] ACS (acute coronary syndrome) (HCC) [I24.9] Sustained ventricular tachycardia [I47.20] Patient Active Problem List   Diagnosis Date Noted   Sustained ventricular tachycardia 10/03/2021   Ventricular tachycardia 10/02/2021   Ventricular fibrillation (HCC)  10/02/2021   Essential hypertension 10/07/2015   Type 2 diabetes mellitus without complication, without long-term current use of insulin (HCC) 10/07/2015   Other seasonal allergic rhinitis 10/05/2015   Essential hypertension, benign 01/13/2013   PCP:  Patient, No Pcp Per (Inactive) Pharmacy:   [id number] - [place], [place] - [address] [place] [address] Phone: [phone] Fax: [phone]     Social Determinants of Health (SDOH) Interventions    Readmission Risk Interventions No flowsheet data found.

## 2021-10-03 NOTE — Progress Notes (Addendum)
Inpatient Diabetes Program Recommendations  AACE/ADA: New Consensus Statement on Inpatient Glycemic Control (2015)  Target Ranges:  Prepandial:   less than 140 mg/dL      Peak postprandial:   less than 180 mg/dL (1-2 hours)      Critically ill patients:  140 - 180 mg/dL   Lab Results  Component Value Date   GLUCAP 368 (H) 10/03/2021   HGBA1C 12.5 (H) 10/02/2021    Review of Glycemic Control  Latest Reference Range & Units 10/02/21 16:59 10/02/21 21:28 10/03/21 06:28  Glucose-Capillary 70 - 99 mg/dL 365 (H) 356 (H) 368 (H)  (H): Data is abnormally high  Diabetes history: DM Outpatient Diabetes medications: none Current orders for Inpatient glycemic control: Novolog 0-15 units TID and 0-5 units QHS, Novolog 4 units TID with meals  Inpatient Diabetes Program Recommendations:    Semglee 18 units QD (0.15 units/kg)  Ordered LWWD booklet and insulin starter kit.  Patient currently in MRI.  Will speak with him at a later time.    Addendum_0 : Spoke with  patient at bedside.  Spoke with pt about new diagnosis. Discussed A1C results with him and explained what an A1C is, basic pathophysiology of DM Type 2, basic home care, basic diabetes diet nutrition principles, importance of checking CBGs and maintaining good CBG control to prevent long-term and short-term complications. Reviewed signs and symptoms of hyperglycemia and hypoglycemia and how to treat hypoglycemia at home. Also reviewed blood sugar goals at home.  RNs to provide ongoing basic DM education at bedside with this patient. Have ordered educational booklet, insulin starter kit, and DM videos. Have also placed RD consult for DM diet education for this patient.   No PCP, no insurance.  Placed TOC consult for assistance.    Discussed diet, The Plate Method, CHO's, impact of physical activity on blood sugars.  He has much room for improvement in his diet.  He drinks sugary beverages and eats a lot of sweets and CHO's.  He is asking  appropriate questions.  LWWD booklet is at bedside.    Will continue to follow while inpatient.  Thank you, Reche Dixon, RN, BSN Diabetes Coordinator Inpatient Diabetes Program [phone] (team pager from 8a-5p)      Will continue to follow while inpatient.  Thank you, Reche Dixon, RN, BSN Diabetes Coordinator Inpatient Diabetes Program [phone] (team pager from 8a-5p)

## 2021-10-03 NOTE — Progress Notes (Signed)
Sent Dr. Rosemary Holms secure chat to make MD aware of CBG of 586 that was taken after patient had started eating lunch and then CBG rechecked within the hour and 342. Rechecked CBG again to verify and 386.  Waiting for response from MD.

## 2021-10-04 LAB — CBC
HCT: 44.1 % (ref 39.0–52.0)
Hemoglobin: 14.5 g/dL (ref 13.0–17.0)
MCH: 28.8 pg (ref 26.0–34.0)
MCHC: 32.9 g/dL (ref 30.0–36.0)
MCV: 87.5 fL (ref 80.0–100.0)
Platelets: 112 10*3/uL — ABNORMAL LOW (ref 150–400)
RBC: 5.04 MIL/uL (ref 4.22–5.81)
RDW: 12.8 % (ref 11.5–15.5)
WBC: 8.4 10*3/uL (ref 4.0–10.5)
nRBC: 0 % (ref 0.0–0.2)

## 2021-10-04 LAB — GLUCOSE, CAPILLARY
Glucose-Capillary: 195 mg/dL — ABNORMAL HIGH (ref 70–99)
Glucose-Capillary: 231 mg/dL — ABNORMAL HIGH (ref 70–99)
Glucose-Capillary: 320 mg/dL — ABNORMAL HIGH (ref 70–99)
Glucose-Capillary: 257 mg/dL — ABNORMAL HIGH (ref 70–99)

## 2021-10-04 LAB — BASIC METABOLIC PANEL
Anion gap: 11 (ref 5–15)
BUN: 19 mg/dL (ref 8–23)
CO2: 20 mmol/L — ABNORMAL LOW (ref 22–32)
Calcium: 8.3 mg/dL — ABNORMAL LOW (ref 8.9–10.3)
Chloride: 102 mmol/L (ref 98–111)
Creatinine, Ser: 0.75 mg/dL (ref 0.61–1.24)
GFR, Estimated: >60 mL/min (ref >60)
Glucose, Bld: 192 mg/dL — ABNORMAL HIGH (ref 70–99)
Potassium: 3.9 mmol/L (ref 3.5–5.1)
Sodium: 133 mmol/L — ABNORMAL LOW (ref 135–145)

## 2021-10-04 MED ORDER — INSULIN ASPART PROT & ASPART (70-30 MIX) 100 UNIT/ML ~~LOC~~ SUSP
15.0000 [IU] | Freq: Two times a day (BID) | SUBCUTANEOUS | Status: DC
  Administered 2021-10-04 – 2021-10-05 (×2): 15 [IU] via SUBCUTANEOUS
  Filled 2021-10-04: qty 10

## 2021-10-04 MED ORDER — MEXILETINE HCL 150 MG PO CAPS
150.0000 mg | ORAL_CAPSULE | Freq: Three times a day (TID) | ORAL | Status: DC
  Administered 2021-10-04 – 2021-10-05 (×5): 150 mg via ORAL
  Filled 2021-10-04 (×8): qty 1

## 2021-10-04 MED ORDER — ORAL CARE MOUTH RINSE
15.0000 mL | Freq: Two times a day (BID) | OROMUCOSAL | Status: DC
  Administered 2021-10-05: 09:00:00 15 mL via OROMUCOSAL

## 2021-10-04 MED ORDER — AMIODARONE HCL 200 MG PO TABS
400.0000 mg | ORAL_TABLET | Freq: Two times a day (BID) | ORAL | Status: DC
  Administered 2021-10-04 – 2021-10-05 (×3): 400 mg via ORAL
  Filled 2021-10-04 (×3): qty 2

## 2021-10-04 NOTE — Plan of Care (Signed)

## 2021-10-04 NOTE — Progress Notes (Addendum)
Inpatient Diabetes Program Recommendations  AACE/ADA: New Consensus Statement on Inpatient Glycemic Control (2015)  Target Ranges:  Prepandial:   less than 140 mg/dL      Peak postprandial:   less than 180 mg/dL (1-2 hours)      Critically ill patients:  140 - 180 mg/dL   Lab Results  Component Value Date   GLUCAP 231 (H) 10/04/2021   HGBA1C 12.5 (H) 10/02/2021    Review of Glycemic Control  Latest Reference Range & Units 10/04/21 06:29 10/04/21 11:20  Glucose-Capillary 70 - 99 mg/dL 169 (H) 450 (H)  (H): Data is abnormally high  Diabetes history: DM2  Current orders for Inpatient glycemic control: Semglee 18 units QD, Novolog 0-15 units TID and 0-5 units QHS, Novolog 4 units TID with meals  Inpatient Diabetes Program Recommendations:    Patient will need affordable insulin at discharge.  Please consider:  Discontinue Semglee Discontinue Novolog 4 units TID with meals  Add 70/30 15 units BID with breakfast and dinner Keep Novolog 0-15 units TID & 0-5 units QHS  Patient currently sleeping.  Will return later to teach insulin.  Provide patient with glucometer.   Will continue to follow while inpatient.  Thank you, Dulce Sellar, RN, BSN Diabetes Coordinator Inpatient Diabetes Program [phone] (team pager from 8a-5p)

## 2021-10-04 NOTE — Progress Notes (Addendum)
Electrophysiology Rounding Note  Patient Name: Daniel [name] Date of Encounter: 10/04/2021  Primary Cardiologist: None Electrophysiologist: Dr. Quentin Ore   Subjective   The patient is doing well today.  At this time, the patient denies chest pain, shortness of breath, or any new concerns.  Inpatient Medications    Scheduled Meds:  amiodarone  400 mg Oral BID   Chlorhexidine Gluconate Cloth  6 each Topical Daily   enoxaparin (LOVENOX) injection  40 mg Subcutaneous Q24H   insulin aspart  0-15 Units Subcutaneous TID WC   insulin aspart  0-5 Units Subcutaneous QHS   insulin aspart  4 Units Subcutaneous TID WC   insulin glargine-yfgn  18 Units Subcutaneous Daily   [START ON 10/05/2021] mouth rinse  15 mL Mouth Rinse BID   metoprolol succinate  12.5 mg Oral Daily   mexiletine  150 mg Oral Q8H   sacubitril-valsartan  1 tablet Oral BID   Continuous Infusions:  sodium chloride Stopped (10/03/21 0624)   lidocaine 1 mg/min (10/04/21 0800)   PRN Meds: alum & mag hydroxide-simeth   Vital Signs    Vitals:   10/04/21 0630 10/04/21 0700 10/04/21 0741 10/04/21 0800  BP:   129/81 120/85  Pulse: 72 83  77  Resp:    16  Temp: 98.5 F (36.9 C)  98.3 F (36.8 C)   TempSrc: Oral  Oral   SpO2: 98% 100%  97%  Weight:      Height:        Intake/Output Summary (Last 24 hours) at 10/04/2021 0808 Last data filed at 10/04/2021 0800 Gross per 24 hour  Intake 1015.85 ml  Output 1025 ml  Net -9.15 ml   Filed Weights   10/02/21 1322 10/03/21 0600 10/04/21 0500  Weight: 122.5 kg 117.9 kg 119.6 kg    Physical Exam    GEN- The patient is well appearing, alert and oriented x 3 today.   Head- normocephalic, atraumatic Eyes-  Sclera clear, conjunctiva pink Ears- hearing intact Oropharynx- clear Neck- supple Lungs- Clear to ausculation bilaterally, normal work of breathing Heart- Regular rate and rhythm, no murmurs, rubs or gallops GI- soft, NT, ND, + BS Extremities- no clubbing or  cyanosis. No edema Skin- no rash or lesion Psych- euthymic mood, full affect Neuro- strength and sensation are intact  Labs    CBC Recent Labs    10/02/21 1323 10/02/21 1334 10/03/21 0610 10/04/21 0131  WBC 10.1  --  8.6 8.4  NEUTROABS 7.6  --   --   --   HGB 17.9*   < > 14.8 14.5  HCT 53.7*   < > 43.5 44.1  MCV 88.3  --  85.5 87.5  PLT 166  --  137* 112*   < > = values in this interval not displayed.   Basic Metabolic Panel Recent Labs    10/03/21 0610 10/04/21 0131  NA 130* 133*  K 4.5 3.9  CL 101 102  CO2 19* 20*  GLUCOSE 313* 192*  BUN 20 19  CREATININE 1.05 0.75  CALCIUM 8.7* 8.3*  MG 2.3  --    Liver Function Tests Recent Labs    10/02/21 1323  AST 205*  ALT 282*  ALKPHOS 92  BILITOT 2.2*  PROT 6.7  ALBUMIN 4.0   No results for input(s): LIPASE, AMYLASE in the last 72 hours. Cardiac Enzymes No results for input(s): CKTOTAL, CKMB, CKMBINDEX, TROPONINI in the last 72 hours.   Telemetry    NSR 70-80s, short 3-4 beat  NSVT and occasional PVCs. No further sustained VT (personally reviewed)  Radiology    CARDIAC CATHETERIZATION  Result Date: 10/02/2021 LM: Normal LAD: No significant prox disease          Vessel tapers very rapidly after Diag 2          Mid to apical LAD is small caliber with 70% mid and 80% dital diffuse diseas          Diag 2 prox 60% disease Lcx: Mid focal 20% stenosis RCA: Very large, dominant vessel          PDA/PLA relatively smaller caliber after dRCA bifurcation          PDA 50% disease LVEDP 27 mHg LV gram: Moderate global hypokinesis with akinetic apex/anteroapex/inferior apex                LVEF 35-40% Multiple recurrent VF/VT in cath lab, one episode did not convert after cough/vagal maneuvers etc and required defibrillation I do not think this is ACS presentation, given the fact that, although disease, mid to distal LAD is patent and should not acutely cause akinetic apex Presentation of sustained VT/VF could be due ay of the  following scenarios: 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 FINDINGS: Cardiomegaly. No confluent airspace opacities, effusions or edema. No acute bony abnormality. IMPRESSION: Cardiomegaly.  No active disease. Electronically Signed   By: Rolm Baptise M.D.   On: 10/02/2021 13:54   MR CARDIAC MORPHOLOGY W WO CONTRAST  Result Date: 10/03/2021 CLINICAL DATA:  Symptomatic VT EXAM: CARDIAC MRI TECHNIQUE: The patient was scanned on a 1.5 Tesla Siemens magnet. A dedicated cardiac coil was used. Functional imaging was done using Fiesta sequences. 2,3, and 4 chamber views were done to assess for RWMA's. Modified Simpson's rule using a short axis stack was used to calculate an ejection fraction on a dedicated work Conservation officer, nature. The patient received 10 cc of Gadavist. After 10 minutes inversion recovery sequences were used to assess for infiltration and scar tissue. CONTRAST:  Gadavist FINDINGS: Moderate LAE. Normal RA. No ASD/PFO. Small pericardial effusion. Normal MV, AV (tri leaflet), TV. Mild aortic root enlargement 4.0 cm Normal RVOT and PV The LV is moderately dilated. There is severe hypokinesis of the mid/ distal anterior wall, septum, apex and inferior apex The quantitative EF is 36% (EDV 278 cc ESV 177 cc SV 102 cc) Normal RV size and function RVEF 56% (EDV 179 cc ESV 79 cc SV 100 cc) No diverticula or evidence of RV dysplasia Septum is thickened at 14 mm with elevated ECV in this area 48% T2 in septal area also elevated at 46% Post gadolinium images sub optimal but suggest more diffuse failure to null  and sub-endocardial to mid myocardial uptake in the septum and anterior wall IMPRESSION: 1. Moderate LVE with mid/apical anterior wall, septal true apex and inferior apical wall motion. Quantitative EF 36%. 2. Diffuse sub endocardial and mid myocardial uptake particularly in the septum and mid/basal anterior wall not in coronary distribution 3. Elevated ECV and T2 along with gadolinium uptake suggests myocarditis 4.  Normal RV size and function RVEF 56% no evidence  of RV dysplasia 5.  Small pericardial effusion 6.  Dilated aortic root 4.0 cm Charlton Haws Electronically Signed   By: Charlton Haws M.D.   On: 10/03/2021 12:00   ECHOCARDIOGRAM COMPLETE  Result Date: 10/03/2021    ECHOCARDIOGRAM REPORT   Patient Name:   NUMAN ZYLSTRA Date of Exam: 10/03/2021 Medical Rec #:  [id number]    Height:       78.0 in Accession #:    [id number]   Weight:       259.9 lb Date of Birth:  1956-04-14    BSA:          2.523 m Patient Age:    65 years     BP:           122/79 mmHg Patient Gender: M            HR:           85 bpm. Exam Location:  Inpatient Procedure: 2D Echo, Cardiac Doppler, Color Doppler and Intracardiac            Opacification Agent Indications:    Ventricular Tachycardia  History:        Patient has no prior history of Echocardiogram examinations.                 Risk Factors:Hypertension and Diabetes.  Sonographer:    Vanetta Shawl Referring Phys: [id number] MANISH J PATWARDHAN IMPRESSIONS  1. Septal apical distal anterior and inferior wall hypokinesis . Left ventricular ejection fraction, by estimation, is 35%. The left ventricle has normal function. The left ventricle demonstrates regional wall motion abnormalities (see scoring diagram/findings for description). The left ventricular internal cavity size was moderately dilated. There is moderate left ventricular hypertrophy. Left ventricular diastolic parameters were normal.  2. Right ventricular systolic function is normal. The right ventricular size is  normal.  3. Left atrial size was moderately dilated.  4. Right atrial size was moderately dilated.  5. The mitral valve is abnormal. Trivial mitral valve regurgitation. No evidence of mitral stenosis.  6. The aortic valve is tricuspid. Aortic valve regurgitation is not visualized. No aortic stenosis is present.  7. Aortic dilatation noted. There is mild dilatation of the aortic root, measuring 41 mm.  8. The inferior vena cava is dilated in size with >50% respiratory variability, suggesting right atrial pressure of 8 mmHg. FINDINGS  Left Ventricle: Septal apical distal anterior and inferior wall hypokinesis. Left ventricular ejection fraction, by estimation, is 35%. The left ventricle has normal function. The left ventricle demonstrates regional wall motion abnormalities. The left ventricular internal cavity size was moderately dilated. There is moderate left ventricular hypertrophy. Left ventricular diastolic parameters were normal. Right Ventricle: The right ventricular size is normal. No increase in right ventricular wall thickness. Right ventricular systolic function is normal. Left Atrium: Left atrial size was moderately dilated. Right Atrium: Right atrial size was moderately dilated. Pericardium: There is no evidence of pericardial effusion. Mitral Valve: The mitral valve is abnormal. There is mild thickening of the mitral valve leaflet(s). Trivial mitral valve regurgitation. No evidence of mitral valve stenosis. Tricuspid Valve: The tricuspid valve is normal in structure. Tricuspid valve regurgitation is not demonstrated. No evidence of tricuspid stenosis. Aortic Valve: The aortic valve is tricuspid. Aortic valve regurgitation is not visualized. No aortic stenosis is present. Aortic valve mean gradient measures 3.0 mmHg. Aortic valve peak gradient measures 5.7 mmHg. Aortic valve area, by VTI measures 2.91 cm. Pulmonic Valve: The pulmonic valve was normal in structure.  Pulmonic valve regurgitation is not  visualized. No evidence of pulmonic stenosis. Aorta: The aortic root is normal in size and structure and aortic dilatation noted. There is mild dilatation of the aortic root, measuring 41 mm. Venous: The inferior vena cava is dilated in size with greater than 50% respiratory variability, suggesting right atrial pressure of 8 mmHg. IAS/Shunts: No atrial level shunt detected by color flow Doppler. Agitated saline contrast was given intravenously to evaluate for intracardiac shunting.  LEFT VENTRICLE PLAX 2D LVIDd:         5.30 cm      Diastology LVIDs:         4.10 cm      LV e' medial:    5.44 cm/s LV PW:         1.60 cm      LV E/e' medial:  11.8 LV IVS:        1.60 cm      LV e' lateral:   6.74 cm/s LVOT diam:     2.20 cm      LV E/e' lateral: 9.5 LV SV:         55 LV SV Index:   22 LVOT Area:     3.80 cm  LV Volumes (MOD) LV vol d, MOD A2C: 206.0 ml LV vol d, MOD A4C: 187.0 ml LV vol s, MOD A2C: 137.0 ml LV vol s, MOD A4C: 126.0 ml LV SV MOD A2C:     69.0 ml LV SV MOD A4C:     187.0 ml LV SV MOD BP:      62.4 ml RIGHT VENTRICLE             IVC RV Basal diam:  4.80 cm     IVC diam: 3.10 cm RV S prime:     14.00 cm/s LEFT ATRIUM              Index        RIGHT ATRIUM           Index LA diam:        5.20 cm  2.06 cm/m   RA Area:     31.20 cm LA Vol (A2C):   91.9 ml  36.42 ml/m  RA Volume:   112.00 ml 44.38 ml/m LA Vol (A4C):   112.0 ml 44.38 ml/m LA Biplane Vol: 106.0 ml 42.01 ml/m  AORTIC VALVE                    PULMONIC VALVE AV Area (Vmax):    2.63 cm     PV Vmax:       0.95 m/s AV Area (Vmean):   2.73 cm     PV Peak grad:  3.6 mmHg AV Area (VTI):     2.91 cm AV Vmax:           119.00 cm/s AV Vmean:          78.400 cm/s AV VTI:            0.191 m AV Peak Grad:      5.7 mmHg AV Mean Grad:      3.0 mmHg LVOT Vmax:         82.20 cm/s LVOT Vmean:        56.300 cm/s LVOT VTI:          0.146 m LVOT/AV VTI ratio: 0.76  AORTA Ao Root diam: 4.10 cm Ao Asc diam:  3.30 cm MITRAL VALVE MV Area (  PHT): 4.31 cm     SHUNTS MV Decel Time: 176 msec    Systemic VTI:  0.15 m MV E velocity: 64.30 cm/s  Systemic Diam: 2.20 cm MV A velocity: 33.00 cm/s MV E/A ratio:  1.95 Jenkins Rouge MD Electronically signed by Jenkins Rouge MD Signature Date/Time: 10/03/2021/1:41:20 PM    Final     Patient Profile     Mr. Rubinstein is a 65 year old man with a history of hypertension and asthma who I am seeing today for ventricular tachycardia/ventricular fibrillation at the request of Dr. Virgina Jock.  He was found in stable VT by EMS on December 26.  This required for shocks and an amiodarone bolus.  Labs on arrival included a high-sensitivity troponin of 7500.  He has had a left heart catheterization which shows no culprit vessel occlusions.  During the procedure he had recurrent ventricular tachycardia requiring defibrillation.  Assessment & Plan    1.  Recurrent hemodynamically stable VT ? In setting of likely myocarditis by cMRI 10/03/2021 On IV amiodarone and lidocaine Will transition to 400 mg IV amiodarone x 7 days, 200 mg BID x 7 days, then 200 mg daily.  Transition to po mexitil 150 mg q8 hrs.  Ventricular ectopy on EKG on admission appears to have at least 2 if not 3 foci Would plan for lifevest on discharge with repeat cMRI in 3 months. If scar note resolved will need ICD.     2. Acute systolic CHF EF [id number] by cMRI Will need GDMT as tolerated; per primary.  Will need lifevest for secondary prevention and repeat cMRI in 3-4 months to see if scar improves or if will ultimately require ICD.    3. 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  Signed, Shirley Friar, PA-C  10/04/2021, 8:08 AM

## 2021-10-04 NOTE — TOC Progression Note (Addendum)
Transition of Care [facility]) - Progression Note    Patient Details  Name: Daniel [name] MRN: [id number] Date of Birth: 1956/05/31  Transition of Care [facility]) CM/SW Contact  Lawerance Sabal, RN Phone Number: 10/04/2021, 11:30 AM  Clinical Narrative:    Appointment made for PCP at [facility] with Elwanda Brooklyn 10/30/2021 @1 :30pm. Faxed referral for Life Vest to [place] with [facility].  MATCH in system effective date 12/28 to 1/5     Expected Discharge Plan: Home/Self Care Barriers to Discharge: Continued Medical Work up  Expected Discharge Plan and Services Expected Discharge Plan: Home/Self Care                                               Social Determinants of Health (SDOH) Interventions    Readmission Risk Interventions No flowsheet data found.

## 2021-10-04 NOTE — Progress Notes (Signed)
Subjective:  Feels well No complaints  NSVT on monitor this morning Cardiac MRI showed myocarditis, details below  Blood sugars better controlled  Objective:  Vital Signs in the last 24 hours: Temp:  [97.7 F (36.5 C)-98.9 F (37.2 C)] 98.5 F (36.9 C) (12/28 0630) Pulse Rate:  [70-91] 83 (12/28 0700) Resp:  [16-20] 20 (12/28 0000) BP: (100-135)/(70-91) 116/76 (12/28 0600) SpO2:  [92 %-100 %] 100 % (12/28 0700) Weight:  [119.6 kg] 119.6 kg (12/28 0500)  Intake/Output from previous day: 12/27 0701 - 12/28 0700 In: 1030.4 [P.O.:240; I.V.:790.4] Out: 1025 [Urine:1025]  Physical Exam Vitals and nursing note reviewed.  Constitutional:      General: He is not in acute distress.    Appearance: He is well-developed.  HENT:     Head: Normocephalic and atraumatic.  Eyes:     Conjunctiva/sclera: Conjunctivae normal.     Pupils: Pupils are equal, round, and reactive to light.  Neck:     Vascular: No JVD.  Cardiovascular:     Rate and Rhythm: Normal rate and regular rhythm.     Pulses: Normal pulses and intact distal pulses.     Heart sounds: No murmur heard. Pulmonary:     Effort: Pulmonary effort is normal.     Breath sounds: Normal breath sounds. No wheezing or rales.  Abdominal:     General: Bowel sounds are normal.     Palpations: Abdomen is soft.     Tenderness: There is no rebound.  Musculoskeletal:        General: No tenderness. Normal range of motion.     Right lower leg: No edema.     Left lower leg: No edema.  Lymphadenopathy:     Cervical: No cervical adenopathy.  Skin:    General: Skin is warm and dry.  Neurological:     Mental Status: He is alert and oriented to person, place, and time.     Cranial Nerves: No cranial nerve deficit.     Lab Results: BMP Recent Labs    10/02/21 1323 10/02/21 1334 10/03/21 0610 10/04/21 0131  NA 133* 134* 130* 133*  K 3.0* 3.1* 4.5 3.9  CL 96* 98 101 102  CO2 17*  --  19* 20*  GLUCOSE 422* 397* 313* 192*  BUN  14 16 20 19   CREATININE 1.40* 1.10 1.05 0.75  CALCIUM 9.1  --  8.7* 8.3*  GFRNONAA 56*  --  >60 >60     CBC Recent Labs  Lab 10/02/21 1323 10/02/21 1334 10/04/21 0131  WBC 10.1   < > 8.4  RBC 6.08*   < > 5.04  HGB 17.9*   < > 14.5  HCT 53.7*   < > 44.1  PLT 166   < > 112*  MCV 88.3   < > 87.5  MCH 29.4   < > 28.8  MCHC 33.3   < > 32.9  RDW 12.4   < > 12.8  LYMPHSABS 1.9  --   --   MONOABS 0.5  --   --   EOSABS 0.0  --   --   BASOSABS 0.0  --   --    < > = values in this interval not displayed.     HEMOGLOBIN A1C Lab Results  Component Value Date   HGBA1C 12.5 (H) 10/02/2021   MPG 312.05 10/02/2021    Cardiac Panel (last 3 results) No results for input(s): CKTOTAL, CKMB, TROPONINI, RELINDX in the last 8760 hours.  BNP (last  3 results) Recent Labs    10/02/21 1639  BNP 216.6*      Lipid Panel     Component Value Date/Time   CHOL 180 10/02/2021 1327   TRIG 189 (H) 10/02/2021 1327   HDL 48 10/02/2021 1327   CHOLHDL 3.8 10/02/2021 1327   VLDL 38 10/02/2021 1327   LDLCALC 94 10/02/2021 1327     Hepatic Function Panel Recent Labs    10/02/21 1323  PROT 6.7  ALBUMIN 4.0  AST 205*  ALT 282*  ALKPHOS 92  BILITOT 2.2*      Cardiac Studies:  EKG 10/03/2021: Sinus rhythm Left intrafascicular block Prolonged QT interval  Cardiac MRI 10/03/2021: 1. Moderate LVE with mid/apical anterior wall, septal true apex and inferior apical wall motion. Quantitative EF 36%.  2. Diffuse sub endocardial and mid myocardial uptake particularly in the septum and mid/basal anterior wall not in coronary distribution  3. Elevated ECV and T2 along with gadolinium uptake suggests myocarditis  4.  Normal RV size and function RVEF 56% no evidence of RV dysplasia  5.  Small pericardial effusion  6.  Dilated aortic root 4.0 cm  Echocardiogram 10/03/2021: 1. Septal apical distal anterior and inferior wall hypokinesis . Left  ventricular ejection fraction, by  estimation, is 35%. The left ventricle  has normal function. The left ventricle demonstrates regional wall motion  abnormalities (see scoring  diagram/findings for description). The left ventricular internal cavity  size was moderately dilated. There is moderate left ventricular  hypertrophy. Left ventricular diastolic parameters were normal.   2. Right ventricular systolic function is normal. The right ventricular  size is normal.   3. Left atrial size was moderately dilated.   4. Right atrial size was moderately dilated.   5. The mitral valve is abnormal. Trivial mitral valve regurgitation. No  evidence of mitral stenosis.   6. The aortic valve is tricuspid. Aortic valve regurgitation is not  visualized. No aortic stenosis is present.   7. Aortic dilatation noted. There is mild dilatation of the aortic root,  measuring 41 mm.   8. The inferior vena cava is dilated in size with >50% respiratory  variability, suggesting right atrial pressure of 8 mmHg.    Assessment & Recommendations:  65 y.o. caucasian male  with hypertension, asthma, neurotmesis of uncontrolled type 2 diabetes mellitus, admitted with unstable VF/VT.   VF/VT: Currently on IV amiodarone and IV lidocaine. Follow EP recommendations regarding transitioning to mexiletine.   Continue monitor in ICU today.    Myocarditis:  Cardiac MRI consistent with myocarditis. 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 Continue supportive care.  Continue Entresto 24-26 mg twice daily, metoprolol succinate 12.5 mg daily. Appreciate EP consult regarding antiarrhythmic therapy. Will need LifeVest.  Type 2 diabetes mellitus: A1c 12%.  New diagnosis. Adjusting insulin dosing. Sugars better controlled now.  Will need diabetes education and insulin on discharge.  Hypertension: Controlled     Elder Negus, MD Pager: [phone] Office: [phone]

## 2021-10-05 ENCOUNTER — Other Ambulatory Visit (HOSPITAL_COMMUNITY): Payer: Self-pay

## 2021-10-05 ENCOUNTER — Encounter: Payer: Self-pay | Admitting: Cardiology

## 2021-10-05 DIAGNOSIS — E118 Type 2 diabetes mellitus with unspecified complications: Secondary | ICD-10-CM

## 2021-10-05 DIAGNOSIS — I401 Isolated myocarditis: Secondary | ICD-10-CM

## 2021-10-05 LAB — GLUCOSE, CAPILLARY
Glucose-Capillary: 162 mg/dL — ABNORMAL HIGH (ref 70–99)
Glucose-Capillary: 299 mg/dL — ABNORMAL HIGH (ref 70–99)
Glucose-Capillary: 223 mg/dL — ABNORMAL HIGH (ref 70–99)

## 2021-10-05 LAB — BASIC METABOLIC PANEL
Anion gap: 7 (ref 5–15)
BUN: 14 mg/dL (ref 8–23)
CO2: 25 mmol/L (ref 22–32)
Calcium: 8.2 mg/dL — ABNORMAL LOW (ref 8.9–10.3)
Chloride: 98 mmol/L (ref 98–111)
Creatinine, Ser: 0.86 mg/dL (ref 0.61–1.24)
GFR, Estimated: >60 mL/min (ref >60)
Glucose, Bld: 174 mg/dL — ABNORMAL HIGH (ref 70–99)
Potassium: 3.8 mmol/L (ref 3.5–5.1)
Sodium: 130 mmol/L — ABNORMAL LOW (ref 135–145)

## 2021-10-05 LAB — CBC
HCT: 44.2 % (ref 39.0–52.0)
Hemoglobin: 14.6 g/dL (ref 13.0–17.0)
MCH: 28.7 pg (ref 26.0–34.0)
MCHC: 33 g/dL (ref 30.0–36.0)
MCV: 87 fL (ref 80.0–100.0)
Platelets: 104 10*3/uL — ABNORMAL LOW (ref 150–400)
RBC: 5.08 MIL/uL (ref 4.22–5.81)
RDW: 12.7 % (ref 11.5–15.5)
WBC: 8.4 10*3/uL (ref 4.0–10.5)
nRBC: 0 % (ref 0.0–0.2)

## 2021-10-05 MED ORDER — MEXILETINE HCL 150 MG PO CAPS
150.0000 mg | ORAL_CAPSULE | Freq: Three times a day (TID) | ORAL | 3 refills | Status: DC
  Filled 2021-10-05: qty 90, 30d supply, fill #0

## 2021-10-05 MED ORDER — ASPIRIN 81 MG PO TBEC
81.0000 mg | DELAYED_RELEASE_TABLET | Freq: Every day | ORAL | 1 refills | Status: DC
  Filled 2021-10-05: qty 30, 30d supply, fill #0

## 2021-10-05 MED ORDER — TORSEMIDE 20 MG PO TABS
20.0000 mg | ORAL_TABLET | Freq: Two times a day (BID) | ORAL | 1 refills | Status: DC
  Filled 2021-10-05: qty 60, 30d supply, fill #0

## 2021-10-05 MED ORDER — INSULIN PEN NEEDLE 32G X 4 MM MISC
22.0000 [IU] | Freq: Two times a day (BID) | 2 refills | Status: DC
  Filled 2021-10-05: qty 100, 30d supply, fill #0
  Filled 2022-02-12: qty 100, 30d supply, fill #1

## 2021-10-05 MED ORDER — NOVOLOG MIX 70/30 FLEXPEN (70-30) 100 UNIT/ML ~~LOC~~ SUPN
22.0000 [IU] | PEN_INJECTOR | Freq: Two times a day (BID) | SUBCUTANEOUS | 11 refills | Status: DC
  Filled 2021-10-05: qty 12, 27d supply, fill #0

## 2021-10-05 MED ORDER — AMIODARONE HCL 200 MG PO TABS
400.0000 mg | ORAL_TABLET | Freq: Two times a day (BID) | ORAL | 1 refills | Status: DC
  Filled 2021-10-05: qty 120, 30d supply, fill #0

## 2021-10-05 MED ORDER — METOPROLOL SUCCINATE ER 25 MG PO TB24
12.5000 mg | ORAL_TABLET | Freq: Every day | ORAL | 1 refills | Status: DC
  Filled 2021-10-05: qty 15, 30d supply, fill #0

## 2021-10-05 MED ORDER — SACUBITRIL-VALSARTAN 24-26 MG PO TABS
1.0000 | ORAL_TABLET | Freq: Two times a day (BID) | ORAL | 1 refills | Status: DC
Start: 2021-10-05 — End: 2021-10-05
  Filled 2021-10-05: qty 60, 30d supply, fill #0

## 2021-10-05 MED ORDER — FUROSEMIDE 10 MG/ML IJ SOLN
20.0000 mg | Freq: Once | INTRAMUSCULAR | Status: AC
Start: 2021-10-05 — End: 2021-10-05
  Administered 2021-10-05: 09:00:00 20 mg via INTRAVENOUS
  Filled 2021-10-05: qty 2

## 2021-10-05 MED ORDER — SACUBITRIL-VALSARTAN 24-26 MG PO TABS: 1.0000 | ORAL_TABLET | Freq: Two times a day (BID) | ORAL | 11 refills | Status: DC

## 2021-10-05 MED ORDER — ROSUVASTATIN CALCIUM 10 MG PO TABS
10.0000 mg | ORAL_TABLET | Freq: Every day | ORAL | 1 refills | Status: DC
  Filled 2021-10-05: qty 30, 30d supply, fill #0

## 2021-10-05 MED ORDER — TORSEMIDE 20 MG PO TABS: 20.0000 mg | ORAL_TABLET | Freq: Two times a day (BID) | ORAL | Status: DC

## 2021-10-05 NOTE — Progress Notes (Signed)
Pt seen and evaluated. Overall tolerated mobility well. Mild unsteadiness noted but no overt LOB. Reviewed life vest jacket use and had pt practice at home. Recommending HHPT to ensure safety at home. Formal note to follow.   Farley Ly, PT, DPT  Acute Rehabilitation Services  Pager: [phone] Office: [phone]

## 2021-10-05 NOTE — Discharge Summary (Addendum)
Physician Discharge Summary  Patient ID: Anatole Sanguino MRN: [id number] DOB/AGE: [birth year] 65 y.o.  Admit date: 10/02/2021 Discharge date: 10/05/2021  Primary Discharge Diagnosis: Monomorphic ventricular tachycardia Ventricular fibrillation  Secondary Discharge Diagnosis: Coronary artery disease Type 2 diabetes mellitus (New diagnosis) Mixed hyperlipidemia  Hospital Course:   65 y.o. caucasian male  with hypertension, asthma, neurotmesis of uncontrolled type 2 diabetes mellitus, admitted with unstable VT/VF, requiring multiple emergent defibrillation.  Coronary angiogram showed diffuse disease and small caliber mid to distal LAD, but not amounting to patient's presentation of unstable VT/VF.  Diagnosis of myocarditis was made based on MRI findings.  This could explain patient's arrhythmia presentation.  He was started on IV amiodarone and IV lidocaine, with which his arrhythmia stabilized.  He was then switched to p.o. amiodarone and p.o. mexiletine with continued stability of his rhythm.  He was fitted with LifeVest for next 3 months at least. He was also diagnosed with type 2 diabetes mellitus during this hospitalization with hemoglobin A1c of 12%.  He was started on appropriate insulin dosing.  Outpatient follow-up with first arranged with cardiology, as well as primary care with Juluis Mire, NP.  Home health physical therapy and Occupational Therapy was arranged.  I discussed with patient's friend Cleatrice Burke regarding cautions to be taken at home, including avoiding physical exertion.  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  He was recommended not to return to work for neck several weeks.  He will need short-term disability.  We offered home transportation.   Discharge Exam: Blood pressure 121/69, pulse 72, temperature 98.6 F (37 C), temperature source Oral, resp. rate 20, height 6\' 6"  ([id number] m), weight 119.6 kg, SpO2 93 %.   Physical Exam Vitals and  nursing note reviewed.  Constitutional:      General: He is not in acute distress.    Appearance: He is well-developed.  HENT:     Head: Normocephalic and atraumatic.  Eyes:     Conjunctiva/sclera: Conjunctivae normal.     Pupils: Pupils are equal, round, and reactive to light.  Neck:     Vascular: No JVD.  Cardiovascular:     Rate and Rhythm: Normal rate and regular rhythm.     Pulses: Normal pulses and intact distal pulses.     Heart sounds: No murmur heard. Pulmonary:     Effort: Pulmonary effort is normal.     Breath sounds: Normal breath sounds. No wheezing or rales.  Abdominal:     General: Bowel sounds are normal.     Palpations: Abdomen is soft.     Tenderness: There is no rebound.  Musculoskeletal:        General: No tenderness. Normal range of motion.     Right lower leg: No edema.     Left lower leg: No edema.  Lymphadenopathy:     Cervical: No cervical adenopathy.  Skin:    General: Skin is warm and dry.  Neurological:     Mental Status: He is alert and oriented to person, place, and time.     Cranial Nerves: No cranial nerve deficit.     Significant Diagnostic Studies:  EKG 10/03/2021: Sinus rhythm LAFB LVH  Cardiac MRI 10/03/2021: 1. Moderate LVE with mid/apical anterior wall, septal true apex and inferior apical wall motion. Quantitative EF 36%.  2. Diffuse sub endocardial and mid myocardial uptake particularly in the septum and mid/basal anterior wall not in coronary distribution  3. Elevated ECV and T2 along with gadolinium uptake  suggests myocarditis  4.  Normal RV size and function RVEF 56% no evidence of RV dysplasia  5.  Small pericardial effusion  6.  Dilated aortic root 4.0 cm   Echocardiogram 10/03/2021: 1. Septal apical distal anterior and inferior wall hypokinesis . Left  ventricular ejection fraction, by estimation, is 35%. The left ventricle  has normal function. The left ventricle demonstrates regional wall motion  abnormalities  (see scoring  diagram/findings for description). The left ventricular internal cavity  size was moderately dilated. There is moderate left ventricular  hypertrophy. Left ventricular diastolic parameters were normal.   2. Right ventricular systolic function is normal. The right ventricular  size is normal.   3. Left atrial size was moderately dilated.   4. Right atrial size was moderately dilated.   5. The mitral valve is abnormal. Trivial mitral valve regurgitation. No  evidence of mitral stenosis.   6. The aortic valve is tricuspid. Aortic valve regurgitation is not  visualized. No aortic stenosis is present.   7. Aortic dilatation noted. There is mild dilatation of the aortic root,  measuring 41 mm.   8. The inferior vena cava is dilated in size with >50% respiratory  variability, suggesting right atrial pressure of 8 mmHg.   Coronary angiography 10/03/2021: LM: Normal LAD: No significant prox disease          Vessel tapers very rapidly after Diag 2          Mid to apical LAD is small caliber with 70% mid and 80% dital diffuse diseas          Diag 2 prox 60% disease Lcx: Mid focal 20% stenosis RCA: Very large, dominant vessel           PDA/PLA relatively smaller caliber after dRCA bifurcation          PDA 50% disease   LVEDP 27 mHg LV gram: Moderate global hypokinesis with akinetic apex/anteroapex/inferior apex                LVEF 35-40%   Multiple recurrent VF/VT in cath lab, one episode did not convert after cough/vagal maneuvers etc and required defibrillation   I do not think this is ACS presentation, given the fact that, although disease, mid to distal LAD is patent and should not acutely cause akinetic apex Presentation of sustained VT/VF could be due ay of the following scenarios: Recent/old apical infarct with scar mediated VT Nonischemic cardiomyopathy with arrhythmic complication Takotsubo cardiomyopathy (although by definition, cannot be called this, due to disease  in mid to distal LAD   Admit to ICU Continue IV amiodarone Added IV lidocaine Will get cardiac MRI after stabilization  Labs:   Lab Results  Component Value Date   WBC 8.4 10/05/2021   HGB 14.6 10/05/2021   HCT 44.2 10/05/2021   MCV 87.0 10/05/2021   PLT 104 (L) 10/05/2021    Recent Labs  Lab 10/02/21 1323 10/02/21 1334 10/05/21 0553  NA 133*   < > 130*  K 3.0*   < > 3.8  CL 96*   < > 98  CO2 17*   < > 25  BUN 14   < > 14  CREATININE 1.40*   < > 0.86  CALCIUM 9.1   < > 8.2*  PROT 6.7  --   --   BILITOT 2.2*  --   --   ALKPHOS 92  --   --   ALT 282*  --   --   AST  205*  --   --   GLUCOSE 422*   < > 174*   < > = values in this interval not displayed.    Lipid Panel     Component Value Date/Time   CHOL 180 10/02/2021 1327   TRIG 189 (H) 10/02/2021 1327   HDL 48 10/02/2021 1327   CHOLHDL 3.8 10/02/2021 1327   VLDL 38 10/02/2021 1327   LDLCALC 94 10/02/2021 1327    BNP (last 3 results) Recent Labs    10/02/21 1639  BNP 216.6*    HEMOGLOBIN A1C Lab Results  Component Value Date   HGBA1C 12.5 (H) 10/02/2021   MPG 312.05 10/02/2021    Cardiac Panel (last 3 results)  Latest Reference Range & Units 10/02/21 13:23 10/02/21 16:39 10/03/21 06:10  Troponin I (High Sensitivity) <18 ng/L 227 (HH) 4,978 (HH) 7,548 (HH)  (HH): Data is critically high   Radiology: CARDIAC CATHETERIZATION  Result Date: 10/02/2021 LM: Normal LAD: No significant prox disease          Vessel tapers very rapidly after Diag 2          Mid to apical LAD is small caliber with 70% mid and 80% dital diffuse diseas          Diag 2 prox 60% disease Lcx: Mid focal 20% stenosis RCA: Very large, dominant vessel          PDA/PLA relatively smaller caliber after dRCA bifurcation          PDA 50% disease LVEDP 27 mHg LV gram: Moderate global hypokinesis with akinetic apex/anteroapex/inferior apex                LVEF 35-40% Multiple recurrent VF/VT in cath lab, one episode did not convert after  cough/vagal maneuvers etc and required defibrillation I do not think this is ACS presentation, given the fact that, although disease, mid to distal LAD is patent and should not acutely cause akinetic apex Presentation of sustained VT/VF could be due ay of the following scenarios: 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 FINDINGS: Cardiomegaly. No confluent airspace opacities, effusions or edema. No acute bony abnormality. IMPRESSION: Cardiomegaly.  No active disease. Electronically Signed   By: Rolm Baptise M.D.   On: 10/02/2021 13:54   MR CARDIAC MORPHOLOGY W WO CONTRAST  Result Date: 10/03/2021 CLINICAL DATA:  Symptomatic VT EXAM: CARDIAC MRI TECHNIQUE: The patient was scanned on a 1.5 Tesla Siemens magnet. A dedicated cardiac coil was used. Functional imaging was done using Fiesta sequences. 2,3, and 4 chamber views were done to assess for RWMA's. Modified Simpson's rule using a short axis stack was used to calculate an ejection fraction on a dedicated work Conservation officer, nature. The patient received 10 cc of Gadavist. After 10 minutes inversion recovery sequences were used to assess for infiltration and scar tissue. CONTRAST:  Gadavist FINDINGS: Moderate LAE. Normal RA. No ASD/PFO. Small pericardial effusion. Normal MV, AV (tri leaflet), TV. Mild aortic root enlargement 4.0 cm Normal RVOT and PV The LV is moderately dilated. There is severe hypokinesis of the mid/ distal anterior wall, septum, apex and inferior apex The quantitative EF  is 36% (EDV 278 cc ESV 177 cc SV 102 cc) Normal RV size and function RVEF  56% (EDV 179 cc ESV 79 cc SV 100 cc) No diverticula or evidence of RV dysplasia Septum is thickened at 14 mm with elevated ECV in this area 48% T2 in septal area also elevated at 46% Post gadolinium images sub optimal but suggest more diffuse failure to null and sub-endocardial to mid myocardial uptake in the septum and anterior wall IMPRESSION: 1. Moderate LVE with mid/apical anterior wall, septal true apex and inferior apical wall motion. Quantitative EF 36%. 2. Diffuse sub endocardial and mid myocardial uptake particularly in the septum and mid/basal anterior wall not in coronary distribution 3. Elevated ECV and T2 along with gadolinium uptake suggests myocarditis 4.  Normal RV size and function RVEF 56% no evidence of RV dysplasia 5.  Small pericardial effusion 6.  Dilated aortic root 4.0 cm Jenkins Rouge Electronically Signed   By: Jenkins Rouge M.D.   On: 10/03/2021 12:00   ECHOCARDIOGRAM COMPLETE  Result Date: 10/03/2021    ECHOCARDIOGRAM REPORT   Patient Name:   NIEGEL ZEHNER Date of Exam: 10/03/2021 Medical Rec #:  [id number]    Height:       78.0 in Accession #:    [id number]   Weight:       259.9 lb Date of Birth:  28-Dec-1955    BSA:          2.523 m Patient Age:    87 years     BP:           122/79 mmHg Patient Gender: M            HR:           85 bpm. Exam Location:  Inpatient Procedure: 2D Echo, Cardiac Doppler, Color Doppler and Intracardiac            Opacification Agent Indications:    Ventricular Tachycardia  History:        Patient has no prior history of Echocardiogram examinations.                 Risk Factors:Hypertension and Diabetes.  Sonographer:    Glo Herring Referring Phys: [id number] [place]  1. Septal apical distal anterior and inferior wall hypokinesis . Left ventricular ejection fraction, by estimation, is 35%. The left ventricle has normal function. The left ventricle demonstrates regional wall motion abnormalities (see scoring diagram/findings for  description). The left ventricular internal cavity size was moderately dilated. There is moderate left ventricular hypertrophy. Left ventricular diastolic parameters were normal.  2. Right ventricular systolic function is normal. The right ventricular size is normal.  3. Left atrial size was moderately dilated.  4. Right atrial size was moderately dilated.  5. The mitral valve is abnormal. Trivial mitral valve regurgitation. No evidence of mitral stenosis.  6. The aortic valve is tricuspid. Aortic valve regurgitation is not visualized. No aortic stenosis is present.  7. Aortic dilatation noted. There is mild dilatation of the aortic root, measuring 41 mm.  8. The inferior vena cava is dilated in size with >50% respiratory variability, suggesting right atrial pressure of 8 mmHg. FINDINGS  Left Ventricle: Septal apical distal anterior and inferior wall hypokinesis. Left ventricular ejection fraction, by estimation, is 35%. The left ventricle has normal function. The left ventricle demonstrates regional wall motion abnormalities. The left ventricular internal cavity size was moderately dilated. There is moderate left ventricular hypertrophy. Left ventricular diastolic parameters were  normal. Right Ventricle: The right ventricular size is normal. No increase in right ventricular wall thickness. Right ventricular systolic function is normal. Left Atrium: Left atrial size was moderately dilated. Right Atrium: Right atrial size was moderately dilated. Pericardium: There is no evidence of pericardial effusion. Mitral Valve: The mitral valve is abnormal. There is mild thickening of the mitral valve leaflet(s). Trivial mitral valve regurgitation. No evidence of mitral valve stenosis. Tricuspid Valve: The tricuspid valve is normal in structure. Tricuspid valve regurgitation is not demonstrated. No evidence of tricuspid stenosis. Aortic Valve: The aortic valve is tricuspid. Aortic valve regurgitation is not visualized. No  aortic stenosis is present. Aortic valve mean gradient measures 3.0 mmHg. Aortic valve peak gradient measures 5.7 mmHg. Aortic valve area, by VTI measures 2.91 cm. Pulmonic Valve: The pulmonic valve was normal in structure. Pulmonic valve regurgitation is not visualized. No evidence of pulmonic stenosis. Aorta: The aortic root is normal in size and structure and aortic dilatation noted. There is mild dilatation of the aortic root, measuring 41 mm. Venous: The inferior vena cava is dilated in size with greater than 50% respiratory variability, suggesting right atrial pressure of 8 mmHg. IAS/Shunts: No atrial level shunt detected by color flow Doppler. Agitated saline contrast was given intravenously to evaluate for intracardiac shunting.  LEFT VENTRICLE PLAX 2D LVIDd:         5.30 cm      Diastology LVIDs:         4.10 cm      LV e' medial:    5.44 cm/s LV PW:         1.60 cm      LV E/e' medial:  11.8 LV IVS:        1.60 cm      LV e' lateral:   6.74 cm/s LVOT diam:     2.20 cm      LV E/e' lateral: 9.5 LV SV:         55 LV SV Index:   22 LVOT Area:     3.80 cm  LV Volumes (MOD) LV vol d, MOD A2C: 206.0 ml LV vol d, MOD A4C: 187.0 ml LV vol s, MOD A2C: 137.0 ml LV vol s, MOD A4C: 126.0 ml LV SV MOD A2C:     69.0 ml LV SV MOD A4C:     187.0 ml LV SV MOD BP:      62.4 ml RIGHT VENTRICLE             IVC RV Basal diam:  4.80 cm     IVC diam: 3.10 cm RV S prime:     14.00 cm/s LEFT ATRIUM              Index        RIGHT ATRIUM           Index LA diam:        5.20 cm  2.06 cm/m   RA Area:     31.20 cm LA Vol (A2C):   91.9 ml  36.42 ml/m  RA Volume:   112.00 ml 44.38 ml/m LA Vol (A4C):   112.0 ml 44.38 ml/m LA Biplane Vol: 106.0 ml 42.01 ml/m  AORTIC VALVE                    PULMONIC VALVE AV Area (Vmax):    2.63 cm     PV Vmax:       0.95 m/s AV Area (Vmean):  2.73 cm     PV Peak grad:  3.6 mmHg AV Area (VTI):     2.91 cm AV Vmax:           119.00 cm/s AV Vmean:          78.400 cm/s AV VTI:            0.191 m  AV Peak Grad:      5.7 mmHg AV Mean Grad:      3.0 mmHg LVOT Vmax:         82.20 cm/s LVOT Vmean:        56.300 cm/s LVOT VTI:          0.146 m LVOT/AV VTI ratio: 0.76  AORTA Ao Root diam: 4.10 cm Ao Asc diam:  3.30 cm MITRAL VALVE MV Area (PHT): 4.31 cm    SHUNTS MV Decel Time: 176 msec    Systemic VTI:  0.15 m MV E velocity: 64.30 cm/s  Systemic Diam: 2.20 cm MV A velocity: 33.00 cm/s MV E/A ratio:  1.95 Charlton Haws MD Electronically signed by Charlton Haws MD Signature Date/Time: 10/03/2021/1:41:20 PM    Final       FOLLOW UP PLANS AND APPOINTMENTS Discharge Instructions     Avoid straining   Complete by: As directed    Diet - low sodium heart healthy   Complete by: As directed    Face-to-face encounter (required for Medicare/Medicaid patients)   Complete by: As directed    I Icker Swigert J Yao Hyppolite certify that this patient is under my care and that I, or a nurse practitioner or physician's assistant working with me, had a face-to-face encounter that meets the physician face-to-face encounter requirements with this patient on 10/05/2021. The encounter with the patient was in whole, or in part for the following medical condition(s) which is the primary reason for home health care (List medical condition): Acute myocarditis   The encounter with the patient was in whole, or in part, for the following medical condition, which is the primary reason for home health care: Deconditioning   I certify that, based on my findings, the following services are medically necessary home health services: Physical therapy   Reason for Medically Necessary Home Health Services: Therapy- Investment banker, operational, Patent examiner   My clinical findings support the need for the above services: Shortness of breath with activity   Further, I certify that my clinical findings support that this patient is homebound due to: Shortness of Breath with activity   Heart Failure patients record your daily weight using  the same scale at the same time of day   Complete by: As directed    Home Health   Complete by: As directed    To provide the following care/treatments: OT   Increase activity slowly   Complete by: As directed    STOP any activity that causes chest pain, shortness of breath, dizziness, sweating, or exessive weakness   Complete by: As directed       Allergies as of 10/05/2021   No Known Allergies      Medication List     STOP taking these medications    amLODipine 10 MG tablet Commonly known as: NORVASC   aspirin 325 MG tablet Replaced by: Aspirin Low Dose 81 MG EC tablet   ibuprofen 200 MG tablet Commonly known as: ADVIL   omega-3 acid ethyl esters 1 g capsule Commonly known as: LOVAZA       TAKE these medications  amiodarone 200 MG tablet Commonly known as: PACERONE Take 2 tablets (400 mg total) by mouth 2 (two) times daily.   Aspirin Low Dose 81 MG EC tablet Generic drug: aspirin Take 1 tablet (81 mg total) by mouth daily. Replaces: aspirin 325 MG tablet   metoprolol succinate 25 MG 24 hr tablet Commonly known as: TOPROL-XL Take 1/2 tablets (12.5 mg total) by mouth daily. Start taking on: October 06, 2021   mexiletine 150 MG capsule Commonly known as: MEXITIL Take 1 capsule (150 mg total) by mouth every 8 (eight) hours.   multivitamin with minerals Tabs tablet Take 1 tablet by mouth daily.   NovoLOG Mix 70/30 FlexPen (70-30) 100 UNIT/ML FlexPen Generic drug: insulin aspart protamine - aspart Inject 22 Units into the skin 2 (two) times daily.   Pentips 32G X 4 MM Misc Generic drug: Insulin Pen Needle Use to inject insulin twice daily.   rosuvastatin 10 MG tablet Commonly known as: Crestor Take 1 tablet (10 mg total) by mouth daily.   sacubitril-valsartan 24-26 MG Commonly known as: ENTRESTO Take 1 tablet by mouth 2 (two) times daily.   simethicone 125 MG chewable tablet Commonly known as: MYLICON Chew 0000000 mg by mouth every 6 (six)  hours as needed for flatulence.   torsemide 20 MG tablet Commonly known as: DEMADEX Take 1 tablet (20 mg total) by mouth 2 (two) times daily.               Durable Medical Equipment  (From admission, onward)           Start     Ordered   10/04/21 1201  For home use only DME Vest life vest  Once       Comments: Expected length of need: 4 months   10/04/21 1200            Follow-up Information     [facility] Follow up.   Specialty: Family Medicine Why: Genice Rouge  10/30/2021 @1 :30pm. Contact information: [place] [phone]        Shirley Friar, PA-C Follow up.   Specialty: Physician Assistant Why: on 2/15 at 1140 for post hospital follow up Contact information: [place] [address] [phone]         Nigel Mormon, MD Follow up on 10/10/2021.   Specialties: Cardiology, Radiology Why: 2:30 PM Contact information: [place] [address] [phone]                  Nigel Mormon, MD Pager: [phone] Office: [phone]

## 2021-10-05 NOTE — Evaluation (Signed)
Occupational Therapy Evaluation Patient Details Name: Daniel [name] MRN: [id number] DOB: [birth year] Today's Date: 10/05/2021   History of Present Illness 65 y.o. male presenting to ED 12/26 with c/o indigestion and difficulty breathing. Patient hemodynamically stable but with multiple runs of VF/VT. Patient shocked x4 by EMS and given 150 IV amiodarone. Elevated torponin.  EKG (-) STEMI but symptoms concerning for unstable angina. Cardiac MRI (+) myocarditis. PMHx significant for HTN, perineal hernia, asthma and unstable VF/VT.   Clinical Impression   PTA patient was living alone in a multi-level townhouse with bedroom/bathroom on 3rd level (multiple flights of steps w/ R rail) and was grossly I with ADLs/IADLs without AD. 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 Patient currently functioning below baseline demonstrating observed ADLs with Min guard to Min A grossly. Patient also limited by deficits listed below including decreased cardiopulmonary endurance and mild balance deficits and would benefit from continued acute OT services in prep for safe d/c home with PRN assist from friends.       Recommendations for follow up therapy are one component of a multi-disciplinary discharge planning process, led by the attending physician.  Recommendations may be updated based on patient status, additional functional criteria and insurance authorization.   Follow Up Recommendations  Home health OT    Assistance Recommended at Discharge PRN  Functional Status Assessment  Patient has had a recent decline in their functional status and demonstrates the ability to make significant improvements in function in a reasonable and predictable amount of time.  Equipment Recommendations  BSC/3in1    Recommendations for Other Services       Precautions / Restrictions Precautions Precautions: Fall;Other (comment) (Lifevest (placed on patient for mobility). Wear 24/7 at time of  d/c) Restrictions Weight Bearing Restrictions: No      Mobility Bed Mobility Overal bed mobility: Modified Independent                  Transfers Overall transfer level: Needs assistance Equipment used: None Transfers: Sit to/from Stand Sit to Stand: Min guard;Min assist           General transfer comment: Sit to stand from EOB with Min A. Mild posterior LOB initially. Sit to stand from standard height commode without external assist and presumed use of grab bar.      Balance Overall balance assessment: Mild deficits observed, not formally tested                                         ADL either performed or assessed with clinical judgement   ADL Overall ADL's : Needs assistance/impaired Eating/Feeding: Independent   Grooming: Supervision/safety;Standing Grooming Details (indicate cue type and reason): Hand washing standing at sink level with supervision A for safety/line management.                 Toilet Transfer: Hydrographic surveyor Details (indicate cue type and reason): Min gaurd to standard height commode in bathroom with use of grab bar. Toileting- Clothing Manipulation and Hygiene: Supervision/safety;Sit to/from stand Toileting - Clothing Manipulation Details (indicate cue type and reason): 3/3 parts of toileting task with supervision A.             Vision Baseline Vision/History: 1 Wears glasses (readers) Ability to See in Adequate Light: 0 Adequate Patient Visual Report: No change from baseline Vision Assessment?: No apparent  visual deficits     Perception     Praxis      Pertinent Vitals/Pain Pain Assessment: No/denies pain     Hand Dominance     Extremity/Trunk Assessment Upper Extremity Assessment Upper Extremity Assessment: Overall WFL for tasks assessed   Lower Extremity Assessment Lower Extremity Assessment: Defer to PT evaluation   Cervical / Trunk Assessment Cervical / Trunk Assessment: Other  exceptions Cervical / Trunk Exceptions: Large body habitus   Communication Communication Communication: No difficulties   Cognition Arousal/Alertness: Awake/alert Behavior During Therapy: WFL for tasks assessed/performed Overall Cognitive Status: Within Functional Limits for tasks assessed                                       General Comments  HR 70-80's throughout; SpO 94-96% on RA.    Exercises     Shoulder Instructions      Home Living Family/patient expects to be discharged to:: Private residence Living Arrangements: Alone Available Help at Discharge: Friend(s);Available PRN/intermittently Type of Home: Other(Comment) (Townhouse) Home Access: Level entry     Home Layout: Multi-level;Bed/bath upstairs Alternate Level Stairs-Number of Steps: 5 steps + 5 steps to 2nd level with R rail and an additional 10 steps to bedroom/bathroom on 3rd floor with R rail. Alternate Level Stairs-Rails: Right Bathroom Shower/Tub: Chief Strategy Officer: Standard     Home Equipment: None          Prior Functioning/Environment Prior Level of Function : Driving;Working/employed (works for at [employer])             Mobility Comments: Independent ADLs Comments: Independent with ADLs/IADLs; drives but car currently in shop        OT Problem List: Decreased strength;Decreased activity tolerance;Impaired balance (sitting and/or standing);Cardiopulmonary status limiting activity      OT Treatment/Interventions: Self-care/ADL training;Therapeutic exercise;Energy conservation;DME and/or AE instruction;Therapeutic activities;Patient/family education;Balance training    OT Goals(Current goals can be found in the care plan section) Acute Rehab OT Goals Patient Stated Goal: To return home. OT Goal Formulation: With patient Time For Goal Achievement: 10/19/21 Potential to Achieve Goals: Good ADL Goals Additional ADL Goal #1: Patient will complete ADLs  with Mod I in prep for safe return home. Additional ADL Goal #2: Patient will recall 3 energy conservation techniques in prep for ADLs/IADLs.  OT Frequency: Min 2X/week   Barriers to D/C: Decreased caregiver support  Lives alone       Co-evaluation              AM-PAC OT "6 Clicks" Daily Activity     Outcome Measure Help from another person eating meals?: None Help from another person taking care of personal grooming?: A Little Help from another person toileting, which includes using toliet, bedpan, or urinal?: A Little Help from another person bathing (including washing, rinsing, drying)?: A Little Help from another person to put on and taking off regular upper body clothing?: A Little Help from another person to put on and taking off regular lower body clothing?: A Little 6 Click Score: 19   End of Session Equipment Utilized During Treatment: Other (comment) Clinical research associate) Nurse Communication: Mobility status;Other (comment) (Wearing schedule for lifevest)  Activity Tolerance: Patient tolerated treatment well Patient left: Other (comment) (Seated EOB for lunch meal.)  OT Visit Diagnosis: Unsteadiness on feet (R26.81);Muscle weakness (generalized) (M62.81)  Time: 1225-8346 OT Time Calculation (min): 23 min Charges:  OT General Charges $OT Visit: 1 Visit OT Evaluation $OT Eval Moderate Complexity: 1 Mod OT Treatments $Self Care/Home Management : 8-22 mins  Mazal Ebey H. OTR/L Supplemental OT, Department of rehab services [phone]  Gurdeep Keesey R H. 10/05/2021, 11:48 AM

## 2021-10-05 NOTE — TOC Transition Note (Signed)
Transition of Care [facility]) - CM/SW Discharge Note   Patient Details  Name: Daniel [name] MRN: [id number] Date of Birth: [birth year]  [facility] CM/SW Contact:  Tom-Johnson, Hershal Coria, RN Phone Number: 10/05/2021, 4:36 PM   Clinical Narrative:     Patient is scheduled for discharge today. Patient does not have medical insurance. CM spoke with patient about home health services. Patient aware of not having medical insurance and agrees with agency that is doing charity services this week. CM called and spoke with Kandee Keen with [facility] [phone]) and he accepts referral.  CM ordered bedside commode and rolling from [employer] and Velna Hatchet to deliver at bedside. Patient denies other needs. Kristie Cowman to transport at discharge. No further TOC needs noted.   Final next level of care: Home w Home Health Services Barriers to Discharge: Barriers Resolved   Patient Goals and CMS Choice Patient states their goals for this hospitalization and ongoing recovery are:: To go home CMS Medicare.gov Compare Post Acute Care list provided to:: Patient Choice offered to / list presented to : Patient  Discharge Placement                       Discharge Plan and Services                DME Arranged: Walker rolling, Bedside commode DME Agency: [employer] Date DME Agency Contacted: 10/05/21 Time DME Agency Contacted: 1140 Representative spoke with at DME Agency: Velna Hatchet HH Arranged: PT, OT [facility] Agency: [facility] Date [facility] Agency Contacted: 10/05/21 Time HH Agency Contacted: 1400 Representative spoke with at [facility] Agency: Kandee Keen  Social Determinants of Health (SDOH) Interventions     Readmission Risk Interventions No flowsheet data found.

## 2021-10-05 NOTE — Progress Notes (Signed)
Pt discharged from unit. Medication/discharge instruction given. PtZoll vest placed and turned on prior to discharge.   Everlean Cherry, RN

## 2021-10-05 NOTE — Progress Notes (Signed)
OT Cancellation Note  Patient Details Name: Daniel [name] MRN: [id number] DOB: 02-04-1956   Cancelled Treatment:    Reason Eval/Treat Not Completed: Medical issues which prohibited therapy. Per cardiology note, patient to wear lifevest for secondary prevention. Lifevest currently off patient. Will await further clarifying information prior to OT evaluation. OT to check back as time allows.   Kallie Edward OTR/L Supplemental OT, Department of rehab services [phone]  Abrial Arrighi R H. 10/05/2021, 10:45 AM

## 2021-10-05 NOTE — Progress Notes (Signed)
PT Evaluation Note    10/05/21 1306  PT Visit Information  Last PT Received On 10/05/21  Assistance Needed +1  History of Present Illness 65 y.o. male presenting to ED 12/26 with c/o indigestion and difficulty breathing. Patient hemodynamically stable but with multiple runs of VF/VT. Patient shocked x4 by EMS and given 150 IV amiodarone. Elevated torponin.  EKG (-) STEMI but symptoms concerning for unstable angina. Cardiac MRI (+) myocarditis. PMHx significant for HTN, perineal hernia, asthma and unstable VF/VT.  Precautions  Precautions Fall;Other (comment) (Lifevest (placed on patient for mobility). Wear 24/7 at time of d/c)  Restrictions  Weight Bearing Restrictions No  Home Living  Family/patient expects to be discharged to: Private residence  Living Arrangements Alone  Available Help at Discharge Friend(s);Available PRN/intermittently  Type of Home Other(Comment) (Townhouse)  Home Access Level entry  Home Layout Multi-level;Bed/bath upstairs  Alternate Level Stairs-Number of Steps 5 steps + 5 steps to 2nd level with R rail and an additional 10 steps to bedroom/bathroom on 3rd floor with R rail.  Alternate Level Stairs-Rails Right  Bathroom Environmental health practitioner None  Prior Function  Prior Level of Function  Driving;Working/employed (works for at [employer])  Mobility Comments Independent  ADLs Comments Independent with ADLs/IADLs; drives but car currently in shop  Communication  Communication No difficulties  Pain Assessment  Pain Assessment No/denies pain  Cognition  Arousal/Alertness Awake/alert  Behavior During Therapy WFL for tasks assessed/performed  Overall Cognitive Status Within Functional Limits for tasks assessed  Upper Extremity Assessment  Upper Extremity Assessment Defer to OT evaluation  Lower Extremity Assessment  Lower Extremity Assessment Generalized weakness  Cervical / Trunk Assessment  Cervical / Trunk  Assessment Other exceptions  Cervical / Trunk Exceptions body habitus  Bed Mobility  Overal bed mobility Modified Independent  Transfers  Overall transfer level Needs assistance  Equipment used None  Transfers Sit to/from Stand  Sit to Stand Min guard;Min assist  General transfer comment Sit to stand from EOB with Min A. Mild posterior LOB initially on first stand. Sit to stand from standard height commode without external assist and presumed use of grab bar.  Ambulation/Gait  Ambulation/Gait assistance Min guard;Supervision  Gait Distance (Feet) 200 Feet  Assistive device None  Gait Pattern/deviations Step-through pattern  General Gait Details Mildly unsteady gait, but no overt LOB noted. Min guard to supervision for safety.  Gait velocity Decreased  Stairs Yes  Stairs assistance Min guard  Stair Management One rail Right;Alternating pattern;Step to pattern;Forwards  Number of Stairs 10  General stair comments Step to pattern for descending and mix between step to and alternating pattern for ascending steps. Min guard for safety.  Balance  Overall balance assessment Mild deficits observed, not formally tested  General Comments  General comments (skin integrity, edema, etc.) Educated about how to apply lift vest and how to manage components  PT - End of Session  Equipment Utilized During Treatment Gait belt;Other (comment) (life vest)  Activity Tolerance Patient tolerated treatment well  Patient left in bed;with call bell/phone within reach (sitting EOB)  Nurse Communication Mobility status  PT Assessment  PT Recommendation/Assessment Patient needs continued PT services  PT Visit Diagnosis Unsteadiness on feet (R26.81);Muscle weakness (generalized) (M62.81)  PT Problem List Decreased strength;Decreased balance;Decreased activity tolerance;Decreased mobility;Decreased knowledge of precautions  PT Plan  PT Frequency (ACUTE ONLY) Min 3X/week  PT Treatment/Interventions (ACUTE  ONLY) Gait training;Functional mobility training;Stair training;Therapeutic exercise;Therapeutic activities;Balance training;Patient/family education  AM-PAC  PT "6 Clicks" Mobility Outcome Measure (Version 2)  Help needed turning from your back to your side while in a flat bed without using bedrails? 4  Help needed moving from lying on your back to sitting on the side of a flat bed without using bedrails? 4  Help needed moving to and from a bed to a chair (including a wheelchair)? 3  Help needed standing up from a chair using your arms (e.g., wheelchair or bedside chair)? 3  Help needed to walk in hospital room? 3  Help needed climbing 3-5 steps with a railing?  3  6 Click Score 20  Consider Recommendation of Discharge To: Home with no services  Progressive Mobility  What is the highest level of mobility based on the progressive mobility assessment? Level 5 (Walks with assist in room/hall) - Balance while stepping forward/back and can walk in room with assist - Complete  Mobility Ambulated with assistance in hallway  PT Recommendation  Follow Up Recommendations Home health PT  Assistance recommended at discharge Intermittent Supervision/Assistance  Functional Status Assessment Patient has had a recent decline in their functional status and demonstrates the ability to make significant improvements in function in a reasonable and predictable amount of time.  PT equipment None recommended by PT  Individuals Consulted  Consulted and Agree with Results and Recommendations Patient  Acute Rehab PT Goals  Patient Stated Goal to go home  PT Goal Formulation With patient  Time For Goal Achievement 10/19/21  Potential to Achieve Goals Good  PT Time Calculation  PT Start Time (ACUTE ONLY) 1050  PT Stop Time (ACUTE ONLY) 1110  PT Time Calculation (min) (ACUTE ONLY) 20 min  PT General Charges  $$ ACUTE PT VISIT 1 Visit  PT Evaluation  $PT Eval Moderate Complexity 1 Mod   Pt admitted secondary to  problem above with deficits above. Pt requiring min A initially to stand, but min guard to supervision for remainder of gait and stair tasks. Educated about using life vest and how to manage components and apply. Recommending HHPT to ensure safety at home. Will continue to follow acutely.   Farley Ly, PT, DPT  Acute Rehabilitation Services  Pager: [phone] Office: [phone]

## 2021-10-05 NOTE — Progress Notes (Signed)
Electrophysiology Rounding Note  Patient Name: Daniel [name] Date of Encounter: 10/05/2021  Primary Cardiologist: Dr. Virgina Jock Electrophysiologist: Dr. Quentin Ore   Subjective   The patient is doing well today.  At this time, the patient denies chest pain, shortness of breath, or any new concerns.  Inpatient Medications    Scheduled Meds:  amiodarone  400 mg Oral BID   Chlorhexidine Gluconate Cloth  6 each Topical Daily   enoxaparin (LOVENOX) injection  40 mg Subcutaneous Q24H   insulin aspart  0-15 Units Subcutaneous TID WC   insulin aspart  0-5 Units Subcutaneous QHS   insulin aspart protamine- aspart  15 Units Subcutaneous BID WC   mouth rinse  15 mL Mouth Rinse BID   metoprolol succinate  12.5 mg Oral Daily   mexiletine  150 mg Oral Q8H   sacubitril-valsartan  1 tablet Oral BID   Continuous Infusions:  sodium chloride Stopped (10/03/21 0624)   PRN Meds: alum & mag hydroxide-simeth   Vital Signs    Vitals:   10/05/21 0000 10/05/21 0100 10/05/21 0200 10/05/21 0324  BP: 129/82 120/80 122/82 120/81  Pulse: 79 77 77   Resp:    (!) 23  Temp:    98.8 F (37.1 C)  TempSrc:    Oral  SpO2: 95% 94% 95%   Weight:      Height:        Intake/Output Summary (Last 24 hours) at 10/05/2021 0703 Last data filed at 10/05/2021 0200 Gross per 24 hour  Intake 1142.53 ml  Output 925 ml  Net 217.53 ml   Filed Weights   10/02/21 1322 10/03/21 0600 10/04/21 0500  Weight: 122.5 kg 117.9 kg 119.6 kg    Physical Exam    GEN- The patient is well appearing, alert and oriented x 3 today.   Head- normocephalic, atraumatic Eyes-  Sclera clear, conjunctiva pink Ears- hearing intact Oropharynx- clear Neck- supple Lungs- Clear to ausculation bilaterally, normal work of breathing Heart- Regular rate and rhythm, no murmurs, rubs or gallops GI- soft, NT, ND, + BS Extremities- no clubbing or cyanosis. No edema Skin- no rash or lesion Psych- euthymic mood, full affect Neuro-  strength and sensation are intact  Labs    CBC Recent Labs    10/02/21 1323 10/02/21 1334 10/04/21 0131 10/05/21 0553  WBC 10.1   < > 8.4 8.4  NEUTROABS 7.6  --   --   --   HGB 17.9*   < > 14.5 14.6  HCT 53.7*   < > 44.1 44.2  MCV 88.3   < > 87.5 87.0  PLT 166   < > 112* 104*   < > = values in this interval not displayed.   Basic Metabolic Panel Recent Labs    10/03/21 0610 10/04/21 0131 10/05/21 0553  NA 130* 133* 130*  K 4.5 3.9 3.8  CL 101 102 98  CO2 19* 20* 25  GLUCOSE 313* 192* 174*  BUN 20 19 14   CREATININE 1.05 0.75 0.86  CALCIUM 8.7* 8.3* 8.2*  MG 2.3  --   --    Liver Function Tests Recent Labs    10/02/21 1323  AST 205*  ALT 282*  ALKPHOS 92  BILITOT 2.2*  PROT 6.7  ALBUMIN 4.0   No results for input(s): LIPASE, AMYLASE in the last 72 hours. Cardiac Enzymes No results for input(s): CKTOTAL, CKMB, CKMBINDEX, TROPONINI in the last 72 hours.   Telemetry    NSR 70-80s (personally reviewed)  Radiology  MR CARDIAC MORPHOLOGY W WO CONTRAST  Result Date: 10/03/2021 CLINICAL DATA:  Symptomatic VT EXAM: CARDIAC MRI TECHNIQUE: The patient was scanned on a 1.5 Tesla Siemens magnet. A dedicated cardiac coil was used. Functional imaging was done using Fiesta sequences. 2,3, and 4 chamber views were done to assess for RWMA's. Modified Simpson's rule using a short axis stack was used to calculate an ejection fraction on a dedicated work [employer]. The patient received 10 cc of Gadavist. After 10 minutes inversion recovery sequences were used to assess for infiltration and scar tissue. CONTRAST:  Gadavist FINDINGS: Moderate LAE. Normal RA. No ASD/PFO. Small pericardial effusion. Normal MV, AV (tri leaflet), TV. Mild aortic root enlargement 4.0 cm Normal RVOT and PV The LV is moderately dilated. There is severe hypokinesis of the mid/ distal anterior wall, septum, apex and inferior apex The quantitative EF is 36% (EDV 278 cc ESV 177 cc SV 102  cc) Normal RV size and function RVEF 56% (EDV 179 cc ESV 79 cc SV 100 cc) No diverticula or evidence of RV dysplasia Septum is thickened at 14 mm with elevated ECV in this area 48% T2 in septal area also elevated at 46% Post gadolinium images sub optimal but suggest more diffuse failure to null and sub-endocardial to mid myocardial uptake in the septum and anterior wall IMPRESSION: 1. Moderate LVE with mid/apical anterior wall, septal true apex and inferior apical wall motion. Quantitative EF 36%. 2. Diffuse sub endocardial and mid myocardial uptake particularly in the septum and mid/basal anterior wall not in coronary distribution 3. Elevated ECV and T2 along with gadolinium uptake suggests myocarditis 4.  Normal RV size and function RVEF 56% no evidence of RV dysplasia 5.  Small pericardial effusion 6.  Dilated aortic root 4.0 cm Charlton Haws Electronically Signed   By: Charlton Haws M.D.   On: 10/03/2021 12:00   ECHOCARDIOGRAM COMPLETE  Result Date: 10/03/2021    ECHOCARDIOGRAM REPORT   Patient Name:   Daniel [name] Date of Exam: 10/03/2021 Medical Rec #:  [id number]    Height:       78.0 in Accession #:    [id number]   Weight:       259.9 lb Date of Birth:  07/05/1956    BSA:          2.523 m Patient Age:    65 years     BP:           122/79 mmHg Patient Gender: M            HR:           85 bpm. Exam Location:  Inpatient Procedure: 2D Echo, Cardiac Doppler, Color Doppler and Intracardiac            Opacification Agent Indications:    Ventricular Tachycardia  History:        Patient has no prior history of Echocardiogram examinations.                 Risk Factors:Hypertension and Diabetes.  Sonographer:    Vanetta Shawl Referring Phys: [id number] MANISH J PATWARDHAN IMPRESSIONS  1. Septal apical distal anterior and inferior wall hypokinesis . Left ventricular ejection fraction, by estimation, is 35%. The left ventricle has normal function. The left ventricle demonstrates regional wall motion abnormalities (see  scoring diagram/findings for description). The left ventricular internal cavity size was moderately dilated. There is moderate left ventricular hypertrophy. Left ventricular diastolic parameters were normal.  2. Right ventricular systolic  function is normal. The right ventricular size is normal.  3. Left atrial size was moderately dilated.  4. Right atrial size was moderately dilated.  5. The mitral valve is abnormal. Trivial mitral valve regurgitation. No evidence of mitral stenosis.  6. The aortic valve is tricuspid. Aortic valve regurgitation is not visualized. No aortic stenosis is present.  7. Aortic dilatation noted. There is mild dilatation of the aortic root, measuring 41 mm.  8. The inferior vena cava is dilated in size with >50% respiratory variability, suggesting right atrial pressure of 8 mmHg. FINDINGS  Left Ventricle: Septal apical distal anterior and inferior wall hypokinesis. Left ventricular ejection fraction, by estimation, is 35%. The left ventricle has normal function. The left ventricle demonstrates regional wall motion abnormalities. The left ventricular internal cavity size was moderately dilated. There is moderate left ventricular hypertrophy. Left ventricular diastolic parameters were normal. Right Ventricle: The right ventricular size is normal. No increase in right ventricular wall thickness. Right ventricular systolic function is normal. Left Atrium: Left atrial size was moderately dilated. Right Atrium: Right atrial size was moderately dilated. Pericardium: There is no evidence of pericardial effusion. Mitral Valve: The mitral valve is abnormal. There is mild thickening of the mitral valve leaflet(s). Trivial mitral valve regurgitation. No evidence of mitral valve stenosis. Tricuspid Valve: The tricuspid valve is normal in structure. Tricuspid valve regurgitation is not demonstrated. No evidence of tricuspid stenosis. Aortic Valve: The aortic valve is tricuspid. Aortic valve  regurgitation is not visualized. No aortic stenosis is present. Aortic valve mean gradient measures 3.0 mmHg. Aortic valve peak gradient measures 5.7 mmHg. Aortic valve area, by VTI measures 2.91 cm. Pulmonic Valve: The pulmonic valve was normal in structure. Pulmonic valve regurgitation is not visualized. No evidence of pulmonic stenosis. Aorta: The aortic root is normal in size and structure and aortic dilatation noted. There is mild dilatation of the aortic root, measuring 41 mm. Venous: The inferior vena cava is dilated in size with greater than 50% respiratory variability, suggesting right atrial pressure of 8 mmHg. IAS/Shunts: No atrial level shunt detected by color flow Doppler. Agitated saline contrast was given intravenously to evaluate for intracardiac shunting.  LEFT VENTRICLE PLAX 2D LVIDd:         5.30 cm      Diastology LVIDs:         4.10 cm      LV e' medial:    5.44 cm/s LV PW:         1.60 cm      LV E/e' medial:  11.8 LV IVS:        1.60 cm      LV e' lateral:   6.74 cm/s LVOT diam:     2.20 cm      LV E/e' lateral: 9.5 LV SV:         55 LV SV Index:   22 LVOT Area:     3.80 cm  LV Volumes (MOD) LV vol d, MOD A2C: 206.0 ml LV vol d, MOD A4C: 187.0 ml LV vol s, MOD A2C: 137.0 ml LV vol s, MOD A4C: 126.0 ml LV SV MOD A2C:     69.0 ml LV SV MOD A4C:     187.0 ml LV SV MOD BP:      62.4 ml RIGHT VENTRICLE             IVC RV Basal diam:  4.80 cm     IVC diam: 3.10 cm RV S prime:  14.00 cm/s LEFT ATRIUM              Index        RIGHT ATRIUM           Index LA diam:        5.20 cm  2.06 cm/m   RA Area:     31.20 cm LA Vol (A2C):   91.9 ml  36.42 ml/m  RA Volume:   112.00 ml 44.38 ml/m LA Vol (A4C):   112.0 ml 44.38 ml/m LA Biplane Vol: 106.0 ml 42.01 ml/m  AORTIC VALVE                    PULMONIC VALVE AV Area (Vmax):    2.63 cm     PV Vmax:       0.95 m/s AV Area (Vmean):   2.73 cm     PV Peak grad:  3.6 mmHg AV Area (VTI):     2.91 cm AV Vmax:           119.00 cm/s AV Vmean:           78.400 cm/s AV VTI:            0.191 m AV Peak Grad:      5.7 mmHg AV Mean Grad:      3.0 mmHg LVOT Vmax:         82.20 cm/s LVOT Vmean:        56.300 cm/s LVOT VTI:          0.146 m LVOT/AV VTI ratio: 0.76  AORTA Ao Root diam: 4.10 cm Ao Asc diam:  3.30 cm MITRAL VALVE MV Area (PHT): 4.31 cm    SHUNTS MV Decel Time: 176 msec    Systemic VTI:  0.15 m MV E velocity: 64.30 cm/s  Systemic Diam: 2.20 cm MV A velocity: 33.00 cm/s MV E/A ratio:  1.95 Jenkins Rouge MD Electronically signed by Jenkins Rouge MD Signature Date/Time: 10/03/2021/1:41:20 PM    Final     Patient Profile     Mr. Goulas is a 65 year old man with a history of hypertension and asthma who I am seeing today for ventricular tachycardia/ventricular fibrillation at the request of Dr. Virgina Jock.  He was found in stable VT by EMS on December 26.  This required for shocks and an amiodarone bolus.  Labs on arrival included a high-sensitivity troponin of 7500.  He has had a left heart catheterization which shows no culprit vessel occlusions.  During the procedure he had recurrent ventricular tachycardia requiring defibrillation.  Assessment & Plan    1.  Recurrent hemodynamically stable VT ? In setting of likely myocarditis by cMRI 10/03/2021 Tolerating po amiodarone and mexitil without further sustained VT thus far.  Continue 400 mg BID amiodarone x 7 days, 200 mg BID x 7 days, then 200 mg daily.  Continue mexitil 150 mg q8 hrs.  Ventricular ectopy on EKG on admission appears to have at least 2 if not 3 foci Would plan for lifevest on discharge with repeat cMRI in 3 months. If scar note resolved will need ICD.     2. Acute systolic CHF EF 123456 by cMRI Will need GDMT as tolerated; per primary.  Plan for lifevest for secondary prevention and repeat cMRI in 3-4 months to see if scar improves or if will ultimately require ICD.    3. Suspected Myocarditis by cMRI Per primary team  4. DM2 New diagnosis Per primary  EP to see  as needed while  remains here Should taper amio as follows Amiodarone 400 mg BID x 7 days -> 200 mg BID x 7 days, then 200 mg daily. Continue Mexitil 150 mg TID.  Outpatient follow up arranged.   For questions or updates, please contact [place] Please consult [email or web address] for contact info under Cardiology/STEMI.  Signed, Shirley Friar, PA-C  10/05/2021, 7:03 AM

## 2021-10-05 NOTE — Progress Notes (Addendum)
Inpatient Diabetes Program Recommendations  AACE/ADA: New Consensus Statement on Inpatient Glycemic Control (2015)  Target Ranges:  Prepandial:   less than 140 mg/dL      Peak postprandial:   less than 180 mg/dL (1-2 hours)      Critically ill patients:  140 - 180 mg/dL   Lab Results  Component Value Date   GLUCAP 162 (H) 10/05/2021   HGBA1C 12.5 (H) 10/02/2021    Review of Glycemic Control  Latest Reference Range & Units 10/05/21 06:37  Glucose-Capillary 70 - 99 mg/dL 162 (H)  (H): Data is abnormally high   Current orders for Inpatient glycemic control: 70/30 15 units BID, Novolog 0-15 units TID and 0-5 units QHS  Inpatient Diabetes Program Recommendations:    70/30 22 units BID  Patient received Semglee 18 units at 1pm yesterday and 70/30 15 units at supper.  This totaled 30 units of basal.  His cbg was 162 mg/dl this morning.  70/30 22 units BID equals 30.8 units of basal insulin.    Educated patient on insulin pen use at home. Reviewed contents of insulin flexpen starter kit. Reviewed all steps of insulin pen including attachment of needle, 2-unit air shot, dialing up dose, giving injection, removing needle, disposal of sharps, storage of unused insulin, disposal of insulin etc. Patient able to provide successful return demonstration. Also reviewed troubleshooting with insulin pen. MD to give patient Rxs for insulin pens and insulin pen needles.  Reinforced DM education, CHO's, hypoglycemia, signs, symptoms and treatments, The Plate Method, and 48/27 insulin.  MD for DC please order: 70/30 Mix Flexpen order # [id number] Insulin pen needles order # [id number]  Provided patient with glucometer and educated on how to use and when to check CBGs.    Will continue to follow while inpatient.  Thank you, Reche Dixon, RN, BSN Diabetes Coordinator Inpatient Diabetes Program [phone] (team pager from 8a-5p)

## 2021-10-06 ENCOUNTER — Telehealth: Payer: Self-pay

## 2021-10-06 NOTE — Telephone Encounter (Signed)
Location of hospitalization: [place] Reason for hospitalization: myocarditis  Date of discharge: 10/05/21 Date of first communication with patient: today Person contacting patient: Rmani Kapusta Current symptoms: na  Do you understand why you were in the Hospital: Yes Questions regarding discharge instructions: None Where were you discharged to: Home Medications reviewed: Yes Allergies reviewed: Yes Dietary changes reviewed: Yes. Discussed low fat and low salt diet.  Referals reviewed: NA Activities of Daily Living: Able to with mild limitations Any transportation issues/concerns: None Any patient concerns: None Confirmed importance & date/time of Follow up appt: Yes Confirmed with patient if condition begins to worsen call. Pt was given the office number and encouraged to call back with questions or concerns: Yes

## 2021-10-10 ENCOUNTER — Ambulatory Visit: Payer: Self-pay | Admitting: Cardiology

## 2021-10-13 ENCOUNTER — Other Ambulatory Visit: Payer: Self-pay

## 2021-10-13 ENCOUNTER — Encounter: Payer: Self-pay | Admitting: Cardiology

## 2021-10-13 ENCOUNTER — Ambulatory Visit: Payer: Self-pay | Admitting: Cardiology

## 2021-10-13 VITALS — BP 115/69 | HR 64 | Temp 98.0°F | Resp 16 | Ht 78.0 in | Wt 254.0 lb

## 2021-10-13 DIAGNOSIS — I472 Ventricular tachycardia, unspecified: Secondary | ICD-10-CM

## 2021-10-13 DIAGNOSIS — I25118 Atherosclerotic heart disease of native coronary artery with other forms of angina pectoris: Secondary | ICD-10-CM | POA: Insufficient documentation

## 2021-10-13 DIAGNOSIS — I401 Isolated myocarditis: Secondary | ICD-10-CM

## 2021-10-13 DIAGNOSIS — I5020 Unspecified systolic (congestive) heart failure: Secondary | ICD-10-CM | POA: Insufficient documentation

## 2021-10-13 MED ORDER — TORSEMIDE 20 MG PO TABS: 20.0000 mg | ORAL_TABLET | Freq: Two times a day (BID) | ORAL | 3 refills | Status: DC

## 2021-10-13 MED ORDER — ROSUVASTATIN CALCIUM 10 MG PO TABS: 10.0000 mg | ORAL_TABLET | Freq: Every day | ORAL | 3 refills | Status: DC

## 2021-10-13 MED ORDER — METOPROLOL SUCCINATE ER 25 MG PO TB24: 12.5000 mg | ORAL_TABLET | Freq: Every day | ORAL | 3 refills | Status: DC

## 2021-10-13 MED ORDER — ASPIRIN 81 MG PO TBEC: 81.0000 mg | DELAYED_RELEASE_TABLET | Freq: Every day | ORAL | 3 refills | Status: AC

## 2021-10-13 MED ORDER — MEXILETINE HCL 150 MG PO CAPS: 150.0000 mg | ORAL_CAPSULE | Freq: Three times a day (TID) | ORAL | 3 refills | Status: DC

## 2021-10-13 MED ORDER — SACUBITRIL-VALSARTAN 24-26 MG PO TABS: 1.0000 | ORAL_TABLET | Freq: Two times a day (BID) | ORAL | 3 refills | Status: DC

## 2021-10-13 NOTE — Progress Notes (Signed)
Follow up visit  Subjective:   Daniel [name], male    DOB: 08-Sep-1956, 66 y.o.   MRN: [id number]     HPI  Chief Complaint  Patient presents with   Tachycardia   Hospitalization Follow-up    66 y.o. 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  With this, ventricular tachycardia diarrhea has been well controlled.  Patient denies any significant dyspnea, orthopnea, regular symptoms.  Also denies any chest pain.  Reports occasional episodes of flushing sensation, but otherwise is tolerating his medical therapy well.  Does not have insurance.  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  With his myocarditis, he is advised avoidance of physical exertion, which limits his ability to work.  Current Outpatient Medications on File Prior to Visit  Medication Sig Dispense Refill   amiodarone (PACERONE) 200 MG tablet Take 2 tablets (400 mg total) by mouth 2 (two) times daily. 120 tablet 1   aspirin 81 MG EC tablet Take 1 tablet (81 mg total) by mouth daily. 30 tablet 1   insulin aspart protamine - aspart (NOVOLOG MIX 70/30 FLEXPEN) (70-30) 100 UNIT/ML FlexPen Inject 22 Units into the skin 2 (two) times daily. 15 mL 11   Insulin Pen Needle 32G X 4 MM MISC Use to inject insulin twice daily. 100 each 2   metoprolol succinate (TOPROL-XL) 25 MG 24 hr tablet Take 1/2 tablets (12.5 mg total) by mouth daily. 30 tablet 1   mexiletine (MEXITIL) 150 MG capsule Take 1 capsule (150 mg total) by mouth every 8 (eight) hours. 90 capsule 3   Multiple Vitamin (MULTIVITAMIN WITH MINERALS) TABS tablet Take 1 tablet by mouth daily.     rosuvastatin (CRESTOR) 10 MG tablet Take 1 tablet (10 mg total) by mouth daily. 30 tablet 1   sacubitril-valsartan (ENTRESTO)  24-26 MG Take 1 tablet by mouth 2 (two) times daily. 60 tablet 11   simethicone (MYLICON) 888 MG chewable tablet Chew 125 mg by mouth every 6 (six) hours as needed for flatulence.     torsemide (DEMADEX) 20 MG tablet Take 1 tablet (20 mg total) by mouth 2 (two) times daily. 60 tablet 1   No current facility-administered medications on file prior to visit.    Cardiovascular & other pertient studies:  EKG 10/13/2021: Sinus rhythm 59 bpm LAFB QTc 481 msec    Echocardiogram 10/03/2021: 1. Moderate LVE with mid/apical anterior wall, septal true apex and inferior apical wall motion. Quantitative EF 36%. 2. Diffuse sub endocardial and mid myocardial uptake particularly in the septum and mid/basal anterior wall not in coronary distribution 3. Elevated ECV and T2 along with gadolinium uptake suggests myocarditis 4.  Normal RV size and function RVEF 56% no evidence of RV dysplasia 5.  Small pericardial effusion 6.  Dilated aortic root 4.0 cm  Recent labs: 10/05/2021: Glucose 174, BUN/Cr 14/0.86. EGFR >60. Na/K 130/3.8. Rest of the CMP normal H/H 14/44. MCV 87. Platelets 104 HbA1C 12.5% Chol 180, TG 189, HDL 48, LDL 94   Review of Systems  Constitutional: Positive for diaphoresis.  Cardiovascular:  Negative for chest pain, dyspnea on exertion, leg swelling, palpitations and syncope.        Vitals:   10/13/21 1150  BP: 115/69  Pulse: 64  Resp: 16  Temp: 98 F (36.7 C)  SpO2: 94%    Body mass index is 29.35 kg/m. Filed Weights   10/13/21 1150  Weight: 254 lb (115.2 kg)    Objective:   Physical Exam Vitals and nursing note reviewed.  Constitutional:      General: He is not in acute distress.    Comments: LifeVest in place  Neck:     Vascular: No JVD.  Cardiovascular:     Rate and Rhythm: Normal rate and regular rhythm.     Heart sounds: Normal heart sounds. No murmur heard. Pulmonary:     Effort: Pulmonary effort is normal.     Breath sounds: Normal breath  sounds. No wheezing or rales.  Musculoskeletal:     Right lower leg: No edema.     Left lower leg: No edema.          Assessment & Recommendations:   66 y.o. 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 Continue EP follow up. Continue Entresto 24-26 mg bid, metoprolol succinate 12.5 mg daily.  Ideally, would like to repeat cardiac MRI in 3 months and reevaluate need for LifeVest versus ICD.  If patient is unable to acquire Medicaid insurance by then, would consider echocardiogram as an alternate option for imaging.  CAD: Diffuse, moderate disease in LAD. Continue Asprin 81 mg, Crestor 20 mg, diabetes control.  Type 2 DM: New diagnosis during 09/2021 hospitalization. A1C 12.6%. Currently on 70/30 insulin. Has appt to establish care with Juluis Mire, NP later this month    On a separate note, have encouraged the patient to apply for [employer] Medicaid, given that he is unable to work at this time due to his diagnosis of myocarditis.  F/u on 11/22/2020    Nigel Mormon, MD Pager: [phone] Office: [phone]

## 2021-10-29 ENCOUNTER — Encounter (HOSPITAL_COMMUNITY): Payer: Self-pay

## 2021-10-29 ENCOUNTER — Other Ambulatory Visit: Payer: Self-pay

## 2021-10-29 ENCOUNTER — Emergency Department (HOSPITAL_COMMUNITY)
Admission: EM | Admit: 2021-10-29 | Discharge: 2021-10-29 | Disposition: A | Discharge status: Home / Self Care | Payer: Medicaid Other | Attending: Student | Admitting: Student

## 2021-10-29 DIAGNOSIS — I2510 Atherosclerotic heart disease of native coronary artery without angina pectoris: Secondary | ICD-10-CM | POA: Diagnosis not present

## 2021-10-29 DIAGNOSIS — Z760 Encounter for issue of repeat prescription: Secondary | ICD-10-CM | POA: Diagnosis present

## 2021-10-29 DIAGNOSIS — I110 Hypertensive heart disease with heart failure: Secondary | ICD-10-CM | POA: Diagnosis not present

## 2021-10-29 DIAGNOSIS — Z7982 Long term (current) use of aspirin: Secondary | ICD-10-CM | POA: Diagnosis not present

## 2021-10-29 DIAGNOSIS — J45909 Unspecified asthma, uncomplicated: Secondary | ICD-10-CM | POA: Diagnosis not present

## 2021-10-29 DIAGNOSIS — E119 Type 2 diabetes mellitus without complications: Secondary | ICD-10-CM | POA: Insufficient documentation

## 2021-10-29 DIAGNOSIS — Z794 Long term (current) use of insulin: Secondary | ICD-10-CM | POA: Insufficient documentation

## 2021-10-29 DIAGNOSIS — I509 Heart failure, unspecified: Secondary | ICD-10-CM | POA: Insufficient documentation

## 2021-10-29 DIAGNOSIS — Z79899 Other long term (current) drug therapy: Secondary | ICD-10-CM | POA: Diagnosis not present

## 2021-10-29 HISTORY — DX: Type 2 diabetes mellitus without complications: E11.9

## 2021-10-29 LAB — CBG MONITORING, ED: Glucose-Capillary: 179 mg/dL — ABNORMAL HIGH (ref 70–99)

## 2021-10-29 MED ORDER — NOVOLOG MIX 70/30 FLEXPEN (70-30) 100 UNIT/ML ~~LOC~~ SUPN
22.0000 [IU] | PEN_INJECTOR | Freq: Two times a day (BID) | SUBCUTANEOUS | 11 refills | Status: DC
  Filled 2021-12-01: qty 15, 34d supply, fill #0
  Filled 2022-01-22: qty 15, 34d supply, fill #1

## 2021-10-29 NOTE — ED Notes (Addendum)
Pt just told me he is having 1/10 mid sternal chest pressure, but states he has been having gas all day.

## 2021-10-29 NOTE — ED Provider Notes (Signed)
[facility] EMERGENCY DEPARTMENT Provider Note  CSN: [id number] Arrival date & time: 10/29/21 1448  Chief Complaint(s) Medication Refill  HPI Castle Dougall is a 66 y.o. male with PMH ventricular tachycardia currently on a LifeVest, T2DM who presents the emergency department for evaluation of a medication refill.  Patient states that he is out of his 70/30 NovoLog and only has enough insulin for 1 more dose and because he received his insulin meds to bed on his last hospital discharge she came back to the ER to have this refilled.  Patient has no complaints including no chest pain, shortness of breath, Donnell pain, nausea, vomiting, dysuria, increased frequency or any other systemic symptoms.  Medication Refill  Past Medical History Past Medical History:  Diagnosis Date   Asthma    Diabetes mellitus without complication ([place])    Hypertension    Patient Active Problem List   Diagnosis Date Noted   HFrEF (heart failure with reduced ejection fraction) ([place]) 10/13/2021   Coronary artery disease of native artery of native heart with stable angina pectoris ([place]) 10/13/2021   Acute idiopathic myocarditis    Type 2 diabetes mellitus with complication, with long-term current use of insulin (HCC)    Sustained ventricular tachycardia 10/03/2021   Ventricular tachycardia 10/02/2021   Ventricular fibrillation ([place]) 10/02/2021   Essential hypertension 10/07/2015   Type 2 diabetes mellitus without complication, without long-term current use of insulin ([place]) 10/07/2015   Other seasonal allergic rhinitis 10/05/2015   Essential hypertension, benign 01/13/2013   Home Medication(s) Prior to Admission medications   Medication Sig Start Date End Date Taking? Authorizing Provider  amiodarone (PACERONE) 200 MG tablet Take 2 tablets (400 mg total) by mouth 2 (two) times daily. 10/05/21   Patwardhan, Reynold Bowen, MD  aspirin 81 MG EC tablet Take 1 tablet (81 mg total) by mouth daily. 10/13/21    Patwardhan, Manish J, MD  insulin aspart protamine - aspart (NOVOLOG MIX 70/30 FLEXPEN) (70-30) 100 UNIT/ML FlexPen Inject 22 Units into the skin 2 (two) times daily. 10/05/21   Patwardhan, Reynold Bowen, MD  Insulin Pen Needle 32G X 4 MM MISC Use to inject insulin twice daily. 10/05/21   Patwardhan, Reynold Bowen, MD  metoprolol succinate (TOPROL-XL) 25 MG 24 hr tablet Take 1/2 tablets (12.5 mg total) by mouth daily. 10/13/21   Patwardhan, Reynold Bowen, MD  mexiletine (MEXITIL) 150 MG capsule Take 1 capsule (150 mg total) by mouth every 8 (eight) hours. 10/13/21   Patwardhan, Reynold Bowen, MD  Multiple Vitamin (MULTIVITAMIN WITH MINERALS) TABS tablet Take 1 tablet by mouth daily.    [provider]  rosuvastatin (CRESTOR) 10 MG tablet Take 1 tablet (10 mg total) by mouth daily. 10/13/21 10/13/22  Patwardhan, Reynold Bowen, MD  sacubitril-valsartan (ENTRESTO) 24-26 MG Take 1 tablet by mouth 2 (two) times daily. 10/13/21   Patwardhan, Reynold Bowen, MD  simethicone (MYLICON) 0000000 MG chewable tablet Chew 125 mg by mouth every 6 (six) hours as needed for flatulence.    [provider]  torsemide (DEMADEX) 20 MG tablet Take 1 tablet (20 mg total) by mouth 2 (two) times daily. 10/13/21   Patwardhan, Reynold Bowen, MD  Past Surgical History Past Surgical History:  Procedure Laterality Date   LEFT HEART CATH AND CORONARY ANGIOGRAPHY N/A 10/02/2021   Procedure: LEFT HEART CATH AND CORONARY ANGIOGRAPHY;  Surgeon: Elder Negus, MD;  Location: [facility] INVASIVE CV LAB;  Service: Cardiovascular;  Laterality: N/A;   Family History Family History  Problem Relation Age of Onset   Cancer Mother        unknown primary   Heart attack Father    Heart disease Father 21       AMI   Cancer Paternal Aunt     Social History Social History   Tobacco Use   Smoking status: Never   Smokeless tobacco: Never   Vaping Use   Vaping Use: Never used  Substance Use Topics   Alcohol use: No   Drug use: No   Allergies Patient has no known allergies.  Review of Systems Review of Systems  All other systems reviewed and are negative.  Physical Exam Vital Signs  I have reviewed the triage vital signs BP 123/75    Pulse (!) 58    Temp 98.3 F (36.8 C) (Oral)    Resp 18    Ht 6\' 6"  (1.981 m)    Wt 115.2 kg    SpO2 100%    BMI 29.35 kg/m   Physical Exam Vitals and nursing note reviewed.  Constitutional:      General: He is not in acute distress.    Appearance: He is well-developed.  HENT:     Head: Normocephalic and atraumatic.  Eyes:     Conjunctiva/sclera: Conjunctivae normal.  Cardiovascular:     Rate and Rhythm: Normal rate and regular rhythm.     Heart sounds: No murmur heard. Pulmonary:     Effort: Pulmonary effort is normal. No respiratory distress.     Breath sounds: Normal breath sounds.  Abdominal:     Palpations: Abdomen is soft.     Tenderness: There is no abdominal tenderness.  Musculoskeletal:        General: No swelling.     Cervical back: Neck supple.  Skin:    General: Skin is warm and dry.     Capillary Refill: Capillary refill takes less than 2 seconds.  Neurological:     Mental Status: He is alert.  Psychiatric:        Mood and Affect: Mood normal.    ED Results and Treatments Labs (all labs ordered are listed, but only abnormal results are displayed) Labs Reviewed - No data to display                                                                                                                        Radiology No results found.  Pertinent labs & imaging results that were available during my care of the patient were reviewed by me and considered in my medical decision making (see MDM for details).  Medications Ordered in ED Medications - No data to display  Procedures Procedures  (including critical care time)  Medical Decision Making / ED Course   This patient presents to the ED for concern of medication refill, this involves an extensive number of treatment options, and is a complaint that carries with it a high risk of complications and morbidity.  The differential diagnosis includes hyperglycemia, need for medication refill  MDM: Seen the emergency department for evaluation of medication refill.  Physical exam is unremarkable.  Unfortunately, our pharmacy in the emergency department does not carry the 70/30 NovoLog pen and the transition of care pharmacy is closed on Sundays.  Thus, we checked his blood sugar here at bedside and found it to be 179.  The patient does have enough insulin to correct for this with a dose of 16 units at 1 AM which is his normal schedule.  He has a follow-up appointment with his primary care physician tomorrow.  I refilled his NovoLog pen and sent it to the [facility].  Patient then discharged.   Additional history obtained: -Additional history obtained from freind -External records from outside source obtained and reviewed including: Chart review including previous notes, labs, imaging, consultation notes   Lab Tests: -I ordered, reviewed, and interpreted labs.   The pertinent results include:   Labs Reviewed - No data to display    Medicines ordered and prescription drug management: No orders of the defined types were placed in this encounter.   -I have reviewed the patients home medicines and have made adjustments as needed  Critical interventions none    Reevaluation: After the interventions noted above, I reevaluated the patient and found that they have :stayed the same  Co morbidities that complicate the patient evaluation  Past Medical History:  Diagnosis Date   Asthma    Diabetes mellitus without complication ([place])    Hypertension        Dispostion: I considered admission for this patient, but he has no acute pathology at this time and is safe for outpatient discharge.  He has close PCP follow-up tomorrow and medications were refilled     Final Clinical Impression(s) / ED Diagnoses Final diagnoses:  None     @PCDICTATION @    Teressa Lower, MD 10/29/21 2340

## 2021-10-29 NOTE — Discharge Instructions (Signed)
Please take a 16u dose scheduled at 0100 this morning and follow up with your PCP for your scheduled appointment tomorrow morning.

## 2021-10-29 NOTE — ED Triage Notes (Signed)
Pt arrived via POV for a refill for novolog mix 70/30 pen. Pt states he's going to see his PCP for first time tomorrow. Pt states he doesn't have enough to carry him over until tomorrow. He states his next dose is due at 0100 tomorrow.

## 2021-10-29 NOTE — ED Provider Triage Note (Signed)
Emergency Medicine Provider Triage Evaluation Note  Daniel [name] , a 66 y.o. male  was evaluated in triage.  Pt complains of med refil.  He reports that he hasn't missed any doses of his insulin, but tomorrow before he goes to the primary care doctor he has 16units left and would need 22.   He also reports feeling pressure in his chest and throat.  He thinks this is gas.  He is wearing his life vest.   Review of Systems  Positive:  Negative: See above  Physical Exam  BP 123/75    Pulse (!) 58    Temp 98.3 F (36.8 C) (Oral)    Resp 18    Ht 6\' 6"  (1.981 m)    Wt 115.2 kg    SpO2 100%    BMI 29.35 kg/m  Gen:   Awake, no distress   Resp:  Normal effort  MSK:   Moves extremities without difficulty  Other:  Life vest in place.   Medical Decision Making  Medically screening exam initiated at 3:04 PM.  Appropriate orders placed.  Daniel [name] was informed that the remainder of the evaluation will be completed by another provider, this initial triage assessment does not replace that evaluation, and the importance of remaining in the ED until their evaluation is complete.  Note: Portions of this report may have been transcribed using voice recognition software. Every effort was made to ensure accuracy; however, inadvertent computerized transcription errors may be present    Lorin Glass, PA-C 10/29/21 1507

## 2021-10-30 ENCOUNTER — Other Ambulatory Visit: Payer: Self-pay

## 2021-10-30 ENCOUNTER — Encounter (INDEPENDENT_AMBULATORY_CARE_PROVIDER_SITE_OTHER): Payer: Self-pay | Admitting: Primary Care

## 2021-10-30 ENCOUNTER — Ambulatory Visit (INDEPENDENT_AMBULATORY_CARE_PROVIDER_SITE_OTHER): Payer: Medicaid Other | Admitting: Primary Care

## 2021-10-30 VITALS — BP 126/74 | HR 55 | Temp 97.9°F | Ht 78.0 in | Wt 249.8 lb

## 2021-10-30 DIAGNOSIS — I25118 Atherosclerotic heart disease of native coronary artery with other forms of angina pectoris: Secondary | ICD-10-CM | POA: Diagnosis not present

## 2021-10-30 DIAGNOSIS — E118 Type 2 diabetes mellitus with unspecified complications: Secondary | ICD-10-CM | POA: Diagnosis not present

## 2021-10-30 DIAGNOSIS — Z7689 Persons encountering health services in other specified circumstances: Secondary | ICD-10-CM | POA: Diagnosis not present

## 2021-10-30 DIAGNOSIS — Z09 Encounter for follow-up examination after completed treatment for conditions other than malignant neoplasm: Secondary | ICD-10-CM

## 2021-10-30 DIAGNOSIS — Z794 Long term (current) use of insulin: Secondary | ICD-10-CM

## 2021-10-30 MED ORDER — EMPAGLIFLOZIN 25 MG PO TABS
25.0000 mg | ORAL_TABLET | Freq: Every day | ORAL | 1 refills | Status: DC
  Filled 2021-10-30 (×2): qty 90, 90d supply, fill #0

## 2021-10-30 MED ORDER — EMPAGLIFLOZIN 25 MG PO TABS
25.0000 mg | ORAL_TABLET | Freq: Every day | ORAL | 1 refills | Status: DC
  Filled 2021-10-30: qty 30, 30d supply, fill #0
  Filled 2021-12-01: qty 30, 30d supply, fill #1
  Filled 2022-01-02: qty 30, 30d supply, fill #2
  Filled 2022-02-06 – 2022-02-16 (×2): qty 30, 30d supply, fill #3
  Filled 2022-03-19: qty 30, 30d supply, fill #4
  Filled 2022-04-23: qty 30, 30d supply, fill #5

## 2021-10-30 MED ORDER — METFORMIN HCL 1000 MG PO TABS
1000.0000 mg | ORAL_TABLET | Freq: Two times a day (BID) | ORAL | 3 refills | Status: DC
  Filled 2021-10-30: qty 60, 30d supply, fill #0

## 2021-10-30 MED ORDER — METFORMIN HCL 1000 MG PO TABS
1000.0000 mg | ORAL_TABLET | Freq: Two times a day (BID) | ORAL | 3 refills | Status: DC
  Filled 2021-10-30 (×2): qty 180, 90d supply, fill #0

## 2021-10-30 NOTE — Addendum Note (Signed)
Addended by: Gwinda Passe on: 10/30/2021 02:40 PM   Modules accepted: Orders

## 2021-10-30 NOTE — Progress Notes (Signed)
[facility]   Subjective:  [name] is a 67 y.o. obese male presents for hospital follow up and establish care. Daniel [name] guardian was given permission by patient to be at his appt.Patient called 911 because he was having difficulty breathing and indigestion.  Admit date to the hospital was 10/02/21 patient was discharged from the hospital on 10/05/21 patient was admitted for: Ventricular tachycardia, Ventricular fibrillation ([place]), Sustained ventricular tachycardia, Acute idiopathic myocarditis and Type 2 diabetes mellitus with complication, with long-term current use of insulin . Today he Denies shortness of breath, headaches, chest pain or lower extremity edema. No abdominal pain - No Nausea, No new weakness tingling or numbness, No Cough . Past Medical History:  Diagnosis Date   Asthma    Diabetes mellitus without complication ([place])    Hypertension      No Known Allergies    Current Outpatient Medications on File Prior to Visit  Medication Sig Dispense Refill   amiodarone (PACERONE) 200 MG tablet Take 2 tablets (400 mg total) by mouth 2 (two) times daily. 120 tablet 1   aspirin 81 MG EC tablet Take 1 tablet (81 mg total) by mouth daily. 90 tablet 3   insulin aspart protamine - aspart (NOVOLOG MIX 70/30 FLEXPEN) (70-30) 100 UNIT/ML FlexPen Inject 22 Units into the skin 2 (two) times daily. 15 mL 11   Insulin Pen Needle 32G X 4 MM MISC Use to inject insulin twice daily. 100 each 2   metoprolol succinate (TOPROL-XL) 25 MG 24 hr tablet Take 1/2 tablets (12.5 mg total) by mouth daily. 90 tablet 3   mexiletine (MEXITIL) 150 MG capsule Take 1 capsule (150 mg total) by mouth every 8 (eight) hours. 270 capsule 3   Multiple Vitamin (MULTIVITAMIN WITH MINERALS) TABS tablet Take 1 tablet by mouth daily.     rosuvastatin (CRESTOR) 10 MG tablet Take 1 tablet (10 mg total) by mouth daily. 90 tablet 3   sacubitril-valsartan (ENTRESTO) 24-26 MG Take 1 tablet by mouth 2 (two) times  daily. 180 tablet 3   simethicone (MYLICON) 0000000 MG chewable tablet Chew 125 mg by mouth every 6 (six) hours as needed for flatulence.     torsemide (DEMADEX) 20 MG tablet Take 1 tablet (20 mg total) by mouth 2 (two) times daily. 180 tablet 3   No current facility-administered medications on file prior to visit.     Review of System: Comprehensive ROS Pertinent positive and negative noted in HPI    Objective:  BP 126/74 (BP Location: Right Arm, Patient Position: Sitting, Cuff Size: Large)    Pulse (!) 55    Temp 97.9 F (36.6 C) (Oral)    Ht 6\' 6"  (1.981 m)    Wt 249 lb 12.8 oz (113.3 kg)    SpO2 96%    BMI 28.87 kg/m   Filed Weights   10/30/21 1341  Weight: 249 lb 12.8 oz (113.3 kg)   Physical Exam: General Appearance: Well nourished, overweight male in no apparent distress. Eyes: PERRLA, EOMs, conjunctiva no swelling or erythema Sinuses: No Frontal/maxillary tenderness ENT/Mouth: Ext aud canals clear, TMs without erythema, bulging. Marland Kitchenas Hearing normal.  Neck: Supple, thyroid normal.  Respiratory: Respiratory effort normal, BS equal bilaterally without rales, rhonchi, wheezing or stridor.  Cardio: RRR with no MRGs. Brisk peripheral pulses without edema.  Abdomen: Soft, + BS.  Non tender, no guarding, rebound, hernias, masses. Lymphatics: Non tender without lymphadenopathy. (Enlarge groin hernia no pain, no hematuria )  Musculoskeletal: Full ROM, 5/5 strength,  normal gait.  Skin: Warm, dry without rashes, lesions, ecchymosis.  Neuro: Cranial nerves intact. Normal muscle tone, no cerebellar symptoms. Sensation intact.  Psych: Awake and oriented X 3, normal affect, Insight and Judgment appropriate.    Assessment:  Daniel [name] was seen today for hospitalization follow-up.  Diagnoses and all orders for this visit:  Encounter to establish care Establish care with PCP  Hospital discharge follow-up Retrieve from hospital discharge  Follow up with [facility]  (Family Medicine); Genice Rouge  10/30/2021 @1 :30pm. Follow up with Shirley Friar, PA-C (Physician Assistant); on 2/15 at 1140 for post hospital follow up Follow up with Nigel Mormon, MD (Cardiology) on 10/13/2021; 12:30 PM  Type 2 diabetes mellitus with complication, with long-term current use of insulin (HCC) A1C 12.5  Discussed  co- morbidities with uncontrol diabetes  Complications -diabetic retinopathy, (close your eyes ? What do you see nothing) nephropathy decrease in kidney function- can lead to dialysis-on a machine 3 days a week to filter your kidney, neuropathy- numbness and tinging in your hands and feet,  increase risk of heart attack and stroke, and amputation due to decrease wound healing and circulation. Decrease your risk by taking medication daily as prescribed, monitor carbohydrates- foods that are high in carbohydrates are the following rice, potatoes, breads, sugars, and pastas.  Reduction in the intake (eating) will assist in lowering your blood sugars. Exercise daily at least 30 minutes daily.  CBG- [id number] Medication adjusted metformin 1000mg  BID and Jardiance 25mg  continue   Coronary artery disease of native artery of native heart with stable angina pectoris ([place]) Followed and managed by cardiology    This note has been created with Dragon speech recognition software and Engineer, materials. Any transcriptional errors are unintentional.   Kerin Perna, NP 10/30/2021, 1:49 PM

## 2021-10-30 NOTE — Patient Instructions (Signed)
Discussed  co- morbidities with uncontrol diabetes  Complications -diabetic retinopathy, (close your eyes ? What do you see nothing) nephropathy decrease in kidney function- can lead to dialysis-on a machine 3 days a week to filter your kidney, neuropathy- numbness and tinging in your hands and feet,  increase risk of heart attack and stroke, and amputation due to decrease wound healing and circulation. Decrease your risk by taking medication daily as prescribed, monitor carbohydrates- foods that are high in carbohydrates are the following rice, potatoes, breads, sugars, and pastas.  Reduction in the intake (eating) will assist in lowering your blood sugars. Exercise daily at least 30 minutes daily. Type 2 Diabetes Mellitus, Diagnosis, Adult Type 2 diabetes (type 2 diabetes mellitus) is a long-term, or chronic, disease. In type 2 diabetes, one or both of these problems may be present: The pancreas does not make enough of a hormone called insulin. Cells in the body do not respond properly to the insulin that the body makes (insulin resistance). Normally, insulin allows blood sugar (glucose) to enter cells in the body. The cells use glucose for energy. Insulin resistance or lack of insulin causes excess glucose to build up in the blood instead of going into cells. This causes high blood glucose (hyperglycemia).  What are the causes? The exact cause of type 2 diabetes is not known. What increases the risk? The following factors may make you more likely to develop this condition: Having a family member with type 2 diabetes. Being overweight or obese. Being inactive (sedentary). Having been diagnosed with insulin resistance. Having a history of prediabetes, diabetes when you were pregnant (gestational diabetes), or polycystic ovary syndrome (PCOS). What are the signs or symptoms? In the early stage of this condition, you may not have symptoms. Symptoms develop slowly and may include: Increased thirst or  hunger. Increased urination. Unexplained weight loss. Tiredness (fatigue) or weakness. Vision changes, such as blurry vision. Dark patches on the skin. How is this diagnosed? This condition is diagnosed based on your symptoms, your medical history, a physical exam, and your blood glucose level. Your blood glucose may be checked with one or more of the following blood tests: A fasting blood glucose (FBG) test. You will not be allowed to eat (you will fast) for 8 hours or longer before a blood sample is taken. A random blood glucose test. This test checks blood glucose at any time of day regardless of when you ate. An A1C (hemoglobin A1C) blood test. This test provides information about blood glucose levels over the previous 2-3 months. An oral glucose tolerance test (OGTT). This test measures your blood glucose at two times: After fasting. This is your baseline blood glucose level. Two hours after drinking a beverage that contains glucose. You may be diagnosed with type 2 diabetes if: Your fasting blood glucose level is 126 mg/dL (7.0 mmol/L) or higher. Your random blood glucose level is 200 mg/dL (11.1 mmol/L) or higher. Your A1C level is 6.5% or higher. Your oral glucose tolerance test result is higher than 200 mg/dL (11.1 mmol/L). These blood tests may be repeated to confirm your diagnosis. How is this treated? Your treatment may be managed by a specialist called an endocrinologist. Type 2 diabetes may be treated by following instructions from your health care provider about: Making dietary and lifestyle changes. These may include: Following a personalized nutrition plan that is developed by a registered dietitian. Exercising regularly. Finding ways to manage stress. Checking your blood glucose level as often as told. Taking  diabetes medicines or insulin daily. This helps to keep your blood glucose levels in the healthy range. Taking medicines to help prevent complications from  diabetes. Medicines may include: Aspirin. Medicine to lower cholesterol. Medicine to control blood pressure. Your health care provider will set treatment goals for you. Your goals will be based on your age, other medical conditions you have, and how you respond to diabetes treatment. Generally, the goal of treatment is to maintain the following blood glucose levels: Before meals: 80-130 mg/dL (4.4-7.2 mmol/L). After meals: below 180 mg/dL (10 mmol/L). A1C level: less than 7%. Follow these instructions at home: Questions to ask your health care provider Consider asking the following questions: Should I meet with a certified diabetes care and education specialist? What diabetes medicines do I need, and when should I take them? What equipment will I need to manage my diabetes at home? How often do I need to check my blood glucose? Where can I find a support group for people with diabetes? What number can I call if I have questions? When is my next appointment? General instructions Take over-the-counter and prescription medicines only as told by your health care provider. Keep all follow-up visits. This is important. Where to find more information For help and guidance and for more information about diabetes, please visit: American Diabetes Association (ADA): www.diabetes.org American Association of Diabetes Care and Education Specialists (ADCES): www.diabeteseducator.org International Diabetes Federation (IDF): MemberVerification.ca Contact a health care provider if: Your blood glucose is at or above 240 mg/dL (13.3 mmol/L) for 2 days in a row. You have been sick or have had a fever for 2 days or longer, and you are not getting better. You have any of the following problems for more than 6 hours: You cannot eat or drink. You have nausea and vomiting. You have diarrhea. Get help right away if: You have severe hypoglycemia. This means your blood glucose is lower than 54 mg/dL (3.0 mmol/L). You  become confused or you have trouble thinking clearly. You have difficulty breathing. You have moderate or large ketone levels in your urine. These symptoms may represent a serious problem that is an emergency. Do not wait to see if the symptoms will go away. Get medical help right away. Call your local emergency services (911 in the [place] Do not drive yourself to the hospital. Summary Type 2 diabetes mellitus is a long-term, or chronic, disease. In type 2 diabetes, the pancreas does not make enough of a hormone called insulin, or cells in the body do not respond properly to insulin that the body makes. This condition is treated by making dietary and lifestyle changes and taking diabetes medicines or insulin. Your health care provider will set treatment goals for you. Your goals will be based on your age, other medical conditions you have, and how you respond to diabetes treatment. Keep all follow-up visits. This is important. This information is not intended to replace advice given to you by your health care provider. Make sure you discuss any questions you have with your health care provider. Document Revised: 12/19/2020 Document Reviewed: 12/19/2020 Elsevier Patient Education  [place].

## 2021-10-31 ENCOUNTER — Telehealth (INDEPENDENT_AMBULATORY_CARE_PROVIDER_SITE_OTHER): Payer: Self-pay

## 2021-10-31 NOTE — Telephone Encounter (Signed)
Copied from [place] [id number]. Topic: Quick Communication - Rx Refill/Question >> Oct 31, 2021  1:14 PM Pawlus, Brayton Layman A wrote: Pt was calling for some clarification on his medication, pt was not sure if he is supposed to break the tablets in half, pt was recently perscribed metFORMIN (GLUCOPHAGE) 1000 MG tablet and empagliflozin (JARDIANCE).   Patient is aware of how to take both medications in question. He verbalized understanding. Nat Christen, CMA

## 2021-11-01 ENCOUNTER — Other Ambulatory Visit: Payer: Self-pay

## 2021-11-01 ENCOUNTER — Other Ambulatory Visit: Payer: Self-pay | Admitting: Pharmacist

## 2021-11-01 DIAGNOSIS — I5020 Unspecified systolic (congestive) heart failure: Secondary | ICD-10-CM

## 2021-11-01 DIAGNOSIS — I472 Ventricular tachycardia, unspecified: Secondary | ICD-10-CM

## 2021-11-01 DIAGNOSIS — I25118 Atherosclerotic heart disease of native coronary artery with other forms of angina pectoris: Secondary | ICD-10-CM

## 2021-11-01 MED ORDER — MEXILETINE HCL 150 MG PO CAPS
150.0000 mg | ORAL_CAPSULE | Freq: Three times a day (TID) | ORAL | 3 refills | Status: DC
  Filled 2021-11-01: qty 90, 30d supply, fill #0
  Filled 2021-12-15: qty 90, 30d supply, fill #1
  Filled 2022-01-22: qty 90, 30d supply, fill #2
  Filled 2022-03-02: qty 90, 30d supply, fill #3
  Filled 2022-04-12: qty 90, 30d supply, fill #4
  Filled 2022-05-30: qty 9, 3d supply, fill #5
  Filled 2022-05-30: qty 261, 87d supply, fill #5

## 2021-11-01 MED ORDER — SACUBITRIL-VALSARTAN 24-26 MG PO TABS
1.0000 | ORAL_TABLET | Freq: Two times a day (BID) | ORAL | 3 refills | Status: DC
  Filled 2021-11-01: qty 180, 90d supply, fill #0
  Filled 2021-12-04: qty 60, 30d supply, fill #0
  Filled 2022-01-02: qty 60, 30d supply, fill #1
  Filled 2022-02-06 – 2022-03-19 (×2): qty 60, 30d supply, fill #2
  Filled 2022-04-23 (×2): qty 60, 30d supply, fill #3
  Filled 2022-06-01: qty 60, 30d supply, fill #4
  Filled 2022-06-29: qty 60, 30d supply, fill #5
  Filled 2022-07-27: qty 60, 30d supply, fill #6
  Filled 2022-08-28: qty 60, 30d supply, fill #7
  Filled 2022-10-03: qty 60, 30d supply, fill #8
  Filled 2022-10-26: qty 60, 30d supply, fill #9

## 2021-11-01 MED ORDER — ROSUVASTATIN CALCIUM 20 MG PO TABS
20.0000 mg | ORAL_TABLET | Freq: Every day | ORAL | 3 refills | Status: DC
  Filled 2021-11-01: qty 30, 30d supply, fill #0
  Filled 2021-12-04: qty 30, 30d supply, fill #1
  Filled 2022-01-02: qty 30, 30d supply, fill #2
  Filled 2022-02-06: qty 30, 30d supply, fill #3
  Filled 2022-03-14: qty 30, 30d supply, fill #4
  Filled 2022-04-12: qty 30, 30d supply, fill #5
  Filled 2022-05-21: qty 30, 30d supply, fill #6
  Filled 2022-06-20: qty 30, 30d supply, fill #7
  Filled 2022-07-20: qty 30, 30d supply, fill #8
  Filled 2022-08-21: qty 30, 30d supply, fill #9

## 2021-11-01 MED ORDER — AMIODARONE HCL 200 MG PO TABS
200.0000 mg | ORAL_TABLET | Freq: Two times a day (BID) | ORAL | 3 refills | Status: DC
  Filled 2021-11-01: qty 60, 30d supply, fill #0
  Filled 2021-11-20: qty 60, 30d supply, fill #1

## 2021-11-01 MED ORDER — TORSEMIDE 20 MG PO TABS
20.0000 mg | ORAL_TABLET | Freq: Two times a day (BID) | ORAL | 3 refills | Status: DC
  Filled 2021-11-01: qty 60, 30d supply, fill #0
  Filled 2021-12-04: qty 60, 30d supply, fill #1
  Filled 2022-01-02: qty 60, 30d supply, fill #2
  Filled 2022-02-06: qty 60, 30d supply, fill #3
  Filled 2022-03-14: qty 60, 30d supply, fill #4
  Filled 2022-04-12: qty 60, 30d supply, fill #5
  Filled 2022-05-21: qty 60, 30d supply, fill #6
  Filled 2022-06-29: qty 60, 30d supply, fill #7
  Filled 2022-07-27: qty 60, 30d supply, fill #8
  Filled 2022-08-28: qty 52, 26d supply, fill #9
  Filled 2022-08-28: qty 8, 4d supply, fill #9
  Filled 2022-10-03: qty 60, 30d supply, fill #10
  Filled 2022-10-26: qty 60, 30d supply, fill #11

## 2021-11-01 MED ORDER — METOPROLOL SUCCINATE ER 25 MG PO TB24
12.5000 mg | ORAL_TABLET | Freq: Every day | ORAL | 3 refills | Status: DC
  Filled 2021-11-01: qty 15, 30d supply, fill #0

## 2021-11-20 ENCOUNTER — Other Ambulatory Visit: Payer: Self-pay

## 2021-11-22 ENCOUNTER — Encounter: Payer: Self-pay | Admitting: Student

## 2021-11-22 ENCOUNTER — Ambulatory Visit: Payer: Self-pay | Admitting: Cardiology

## 2021-11-22 ENCOUNTER — Other Ambulatory Visit: Payer: Self-pay

## 2021-11-22 ENCOUNTER — Ambulatory Visit (INDEPENDENT_AMBULATORY_CARE_PROVIDER_SITE_OTHER): Payer: Medicaid Other | Admitting: Student

## 2021-11-22 VITALS — BP 124/64 | HR 60 | Ht 72.3 in | Wt 249.0 lb

## 2021-11-22 DIAGNOSIS — I472 Ventricular tachycardia, unspecified: Secondary | ICD-10-CM

## 2021-11-22 DIAGNOSIS — I5020 Unspecified systolic (congestive) heart failure: Secondary | ICD-10-CM | POA: Diagnosis not present

## 2021-11-22 DIAGNOSIS — I401 Isolated myocarditis: Secondary | ICD-10-CM | POA: Diagnosis not present

## 2021-11-22 LAB — COMPREHENSIVE METABOLIC PANEL
ALT: 28 IU/L (ref 0–44)
AST: 22 IU/L (ref 0–40)
Albumin/Globulin Ratio: 1.7 (ref 1.2–2.2)
Albumin: 4.4 g/dL (ref 3.8–4.8)
Alkaline Phosphatase: 86 IU/L (ref 44–121)
BUN/Creatinine Ratio: 13 (ref 10–24)
BUN: 16 mg/dL (ref 8–27)
Bilirubin Total: 0.5 mg/dL (ref 0.0–1.2)
CO2: 29 mmol/L (ref 20–29)
Calcium: 9.2 mg/dL (ref 8.6–10.2)
Chloride: 97 mmol/L (ref 96–106)
Creatinine, Ser: 1.24 mg/dL (ref 0.76–1.27)
Globulin, Total: 2.6 g/dL (ref 1.5–4.5)
Glucose: 220 mg/dL — ABNORMAL HIGH (ref 70–99)
Potassium: 3.9 mmol/L (ref 3.5–5.2)
Sodium: 140 mmol/L (ref 134–144)
Total Protein: 7 g/dL (ref 6.0–8.5)
eGFR: 65 mL/min/{1.73_m2} (ref >59)

## 2021-11-22 LAB — TSH: TSH: 1.94 u[IU]/mL (ref 0.450–4.500)

## 2021-11-22 MED ORDER — METOPROLOL SUCCINATE ER 25 MG PO TB24
25.0000 mg | ORAL_TABLET | Freq: Every day | ORAL | 3 refills | Status: DC
  Filled 2021-11-22 – 2022-02-06 (×2): qty 90, 90d supply, fill #0
  Filled 2022-05-30: qty 90, 90d supply, fill #1
  Filled 2022-08-28: qty 90, 90d supply, fill #2

## 2021-11-22 MED ORDER — AMIODARONE HCL 200 MG PO TABS
200.0000 mg | ORAL_TABLET | Freq: Every day | ORAL | 3 refills | Status: DC
  Filled 2021-11-22: qty 90, 90d supply, fill #0
  Filled 2022-01-02: qty 30, 30d supply, fill #0
  Filled 2022-02-21: qty 30, 30d supply, fill #1
  Filled 2022-03-26: qty 30, 30d supply, fill #2
  Filled 2022-04-23: qty 30, 30d supply, fill #3
  Filled 2022-05-30: qty 90, 90d supply, fill #4
  Filled 2022-08-28: qty 90, 90d supply, fill #5

## 2021-11-22 NOTE — Progress Notes (Signed)
PCP:  Kerin Perna, NP Primary Cardiologist: None Electrophysiologist: Vickie Epley, MD   [facility] is a 66 y.o. male seen today for Vickie Epley, MD for post hospital follow up.    Admitted 12/26 - 10/05/21 in the setting of VF arrest, most likely 2/2 myocarditis by cMRI. Deferred ICD with Lifevest in place and GDMT to be titrated.   Since discharge from hospital the patient reports doing well. No syncope. Is "running out of money" not being able to work. 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 he denies chest pain, palpitations, dyspnea, PND, orthopnea, nausea, vomiting, dizziness, syncope, edema, weight gain, or early satiety.  Past Medical History:  Diagnosis Date   Asthma    Diabetes mellitus without complication ([place])    Hypertension    Past Surgical History:  Procedure Laterality Date   LEFT HEART CATH AND CORONARY ANGIOGRAPHY N/A 10/02/2021   Procedure: LEFT HEART CATH AND CORONARY ANGIOGRAPHY;  Surgeon: Nigel Mormon, MD;  Location: [facility];  Service: Cardiovascular;  Laterality: N/A;    Current Outpatient Medications  Medication Sig Dispense Refill   amiodarone (PACERONE) 200 MG tablet Take 1 tablet (200 mg total) by mouth 2 (two) times daily. 180 tablet 3   aspirin 81 MG EC tablet Take 1 tablet (81 mg total) by mouth daily. 90 tablet 3   empagliflozin (JARDIANCE) 25 MG TABS tablet Take 1 tablet (25 mg total) by mouth daily before breakfast. 90 tablet 1   insulin aspart protamine - aspart (NOVOLOG MIX 70/30 FLEXPEN) (70-30) 100 UNIT/ML FlexPen Inject 22 Units into the skin 2 (two) times daily. 15 mL 11   Insulin Pen Needle 32G X 4 MM MISC Use to inject insulin twice daily. 100 each 2   metoprolol succinate (TOPROL-XL) 25 MG 24 hr tablet Take 1/2 tablets (12.5 mg total) by mouth daily. 45 tablet 3   mexiletine (MEXITIL) 150 MG capsule Take 1 capsule (150 mg  total) by mouth every 8 (eight) hours. 270 capsule 3   Multiple Vitamin (MULTIVITAMIN WITH MINERALS) TABS tablet Take 1 tablet by mouth daily.     rosuvastatin (CRESTOR) 20 MG tablet Take 1 tablet (20 mg total) by mouth daily. 90 tablet 3   sacubitril-valsartan (ENTRESTO) 24-26 MG Take 1 tablet by mouth 2 (two) times daily. 180 tablet 3   simethicone (MYLICON) 0000000 MG chewable tablet Chew 125 mg by mouth every 6 (six) hours as needed for flatulence.     torsemide (DEMADEX) 20 MG tablet Take 1 tablet (20 mg total) by mouth 2 (two) times daily. 180 tablet 3   metFORMIN (GLUCOPHAGE) 1000 MG tablet Take 1 tablet (1,000 mg total) by mouth 2 (two) times daily with a meal. (Patient not taking: Reported on 11/22/2021) 180 tablet 3   No current facility-administered medications for this visit.    No Known Allergies  Social History   Socioeconomic History   Marital status: Single    Spouse name: Not on file   Number of children: 0   Years of education: Not on file   Highest education level: Not on file  Occupational History   Not on file  Tobacco Use   Smoking status: Never    Passive exposure: Never   Smokeless tobacco: Never  Vaping Use   Vaping Use: Never used  Substance and Sexual Activity   Alcohol use: No   Drug use: No  Sexual activity: Never  Other Topics Concern   Not on file  Social History Narrative   Marital status: single; not dating      Children: none      Lives: alone      Employment:  Maintenance/production      Tobacco:  None;       Alcohol: none      Drugs: none        Social Determinants of Radio broadcast assistant Strain: Not on file  Food Insecurity: Not on file  Transportation Needs: Not on file  Physical Activity: Not on file  Stress: Not on file  Social Connections: Not on file  Intimate Partner Violence: Not on file     Review of Systems: All other systems reviewed and are otherwise negative except as noted above.  Physical Exam: Vitals:    11/22/21 1149  BP: 124/64  Pulse: 60  SpO2: 95%  Weight: 249 lb (112.9 kg)  Height: 6' 0.3" (1.836 m)    GEN- The patient is well appearing, alert and oriented x 3 today.   HEENT: normocephalic, atraumatic; sclera clear, conjunctiva pink; hearing intact; oropharynx clear; neck supple, no JVP Lymph- no cervical lymphadenopathy Lungs- Clear to ausculation bilaterally, normal work of breathing.  No wheezes, rales, rhonchi Heart- Regular rate and rhythm, no murmurs, rubs or gallops, PMI not laterally displaced GI- soft, non-tender, non-distended, bowel sounds present, no hepatosplenomegaly Extremities- no clubbing, cyanosis, or edema; DP/PT/radial pulses 2+ bilaterally MS- no significant deformity or atrophy Skin- warm and dry, no rash or lesion Psych- euthymic mood, full affect Neuro- strength and sensation are intact  EKG is not ordered. EKG on 10/29/2021 shows sinus bradycardia at 58 bpm, LAFB/RBBB, QRS 118 ms  Additional studies reviewed include: Previous EP notes.   Assessment and Plan:  1.  Recurrent hemodynamically stable VT ? In setting of likely myocarditis by cMRI 10/03/2021 Tolerating po amiodarone and mexitil without further symptomatic  VT thus far.  Decrease amiodarone to 200 mg daily. CMET, TSH today.  Continue mexitil 150 mg q8 hrs.  Ventricular ectopy on prior EKG on admission appears to have at least 2 if not 3 foci Would plan to continue lifevest on discharge with repeat cMRI in 3 months. If scar does not resolved will need ICD.   No driving x 6 month has been discussed with the patient per [place] guidelines.     2. Chronic systolic CHF EF [id number] by cMRI Will need GDMT as tolerated; per primary.  As above, plan for lifevest for secondary prevention and repeat cMRI in 3-4 months to see if scar improves or if will ultimately require ICD.  Increase toprol as we decrease amio.    3. Suspected Myocarditis by cMRI Per primary team Will clarify work recommendation  with Dr. Quentin Ore.    4. DM2 New diagnosis Per PCP.   Follow up with Dr. Quentin Ore in 3 months, after cMRI.  Hopefully he will have insurance by then or can be arranged once medicaid is "pending".   He hopes to be able to avoid ICD.   Shirley Friar, PA-C  11/22/21 11:58 AM

## 2021-11-22 NOTE — Patient Instructions (Signed)
Medication Instructions:  Your physician has recommended you make the following change in your medication:   DECREASE: Amiodarone to 200mg  daily INCREASE: Metoprolol Succinate to 25mg  daily at bedtime  *If you need a refill on your cardiac medications before your next appointment, please call your pharmacy*   Lab Work: TODAY: CMET, TSH  If you have labs (blood work) drawn today and your tests are completely normal, you will receive your results only by: MyChart Message (if you have MyChart) OR A paper copy in the mail If you have any lab test that is abnormal or we need to change your treatment, we will call you to review the results.   Testing/Procedures: Your physician has requested that you have a cardiac MRI in 2 months. Cardiac MRI uses a computer to create images of your heart as its beating, producing both still and moving pictures of your heart and major blood vessels. For further information please visit . Please follow the instruction sheet given to you today for more information.    Follow-Up: At [facility], you and your health needs are our priority.  As part of our continuing mission to provide you with exceptional heart care, we have created designated Provider Care Teams.  These Care Teams include your primary Cardiologist (physician) and Advanced Practice Providers (APPs -  Physician Assistants and Nurse Practitioners) who all work together to provide you with the care you need, when you need it.  We recommend signing up for the patient portal called "MyChart".  Sign up information is provided on this After Visit Summary.  MyChart is used to connect with patients for Virtual Visits (Telemedicine).  Patients are able to view lab/test results, encounter notes, upcoming appointments, etc.  Non-urgent messages can be sent to your provider as well.   To learn more about what you can do with MyChart, go to [email or web address].    Your next appointment:    3 month(s)  The format for your next appointment:   In Person  Provider:   [facility], MD

## 2021-11-23 ENCOUNTER — Ambulatory Visit: Payer: Self-pay | Admitting: Cardiology

## 2021-11-23 ENCOUNTER — Encounter: Payer: Self-pay | Admitting: Cardiology

## 2021-11-23 VITALS — BP 134/77 | HR 58 | Temp 98.0°F | Resp 17 | Ht 72.3 in | Wt 250.2 lb

## 2021-11-23 DIAGNOSIS — I401 Isolated myocarditis: Secondary | ICD-10-CM

## 2021-11-23 DIAGNOSIS — I5020 Unspecified systolic (congestive) heart failure: Secondary | ICD-10-CM

## 2021-11-23 NOTE — Progress Notes (Signed)
Follow up visit  Subjective:   Daniel [name], male    DOB: April 22, 1956, 66 y.o.   MRN: [id number]     HPI  Chief Complaint  Patient presents with   Follow-up   Sustained ventricular tachycardia    66 y/o caucasian male  with hypertension, asthma, uncontrolled type 2 diabetes mellitus, nonobstructive CAD,acute myocarditis complicated by unstable VT  Patient is doing well, denies any dyspnea, palpitations, presyncope, syncope, leg edema symptoms.  In fact, he tells me that he is feeling "the greatest I have ever felt in last several years".  He still declined up 2 flights of stairs to his apartment without any significant difficulty.  He recently saw EP, was continued on mexiletine, amiodarone dose was reduced.  He was recommended continued usage of LifeVest and repeat cardiac MRI in 3 months.  Unfortunately, patient remains without insurance at this time with Medicaid pending.  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  His disability claim with Social Security is also pending and may take several months.  As result, he is in a financially the difficult situation.  Current Outpatient Medications on File Prior to Visit  Medication Sig Dispense Refill   amiodarone (PACERONE) 200 MG tablet Take 1 tablet (200 mg total) by mouth daily. 90 tablet 3   aspirin 81 MG EC tablet Take 1 tablet (81 mg total) by mouth daily. 90 tablet 3   empagliflozin (JARDIANCE) 25 MG TABS tablet Take 1 tablet (25 mg total) by mouth daily before breakfast. 90 tablet 1   insulin aspart protamine - aspart (NOVOLOG MIX 70/30 FLEXPEN) (70-30) 100 UNIT/ML FlexPen Inject 22 Units into the skin 2 (two) times daily. 15 mL 11   Insulin Pen Needle 32G X 4 MM MISC Use to inject insulin twice daily. 100 each 2   metFORMIN (GLUCOPHAGE) 1000 MG tablet Take 1 tablet (1,000 mg total) by mouth 2 (two) times daily with a meal. 180 tablet 3   metoprolol  succinate (TOPROL-XL) 25 MG 24 hr tablet Take 1 tablet (25 mg total) by mouth daily. 90 tablet 3   mexiletine (MEXITIL) 150 MG capsule Take 1 capsule (150 mg total) by mouth every 8 (eight) hours. 270 capsule 3   Multiple Vitamin (MULTIVITAMIN WITH MINERALS) TABS tablet Take 1 tablet by mouth daily.     rosuvastatin (CRESTOR) 20 MG tablet Take 1 tablet (20 mg total) by mouth daily. 90 tablet 3   sacubitril-valsartan (ENTRESTO) 24-26 MG Take 1 tablet by mouth 2 (two) times daily. 180 tablet 3   simethicone (MYLICON) 629 MG chewable tablet Chew 125 mg by mouth every 6 (six) hours as needed for flatulence.     torsemide (DEMADEX) 20 MG tablet Take 1 tablet (20 mg total) by mouth 2 (two) times daily. 180 tablet 3   No current facility-administered medications on file prior to visit.    Cardiovascular & other pertient studies:  EKG 10/13/2021: Sinus rhythm 59 bpm LAFB QTc 481 msec  Cardiac MRI 10/03/2021: 1. Moderate LVE with mid/apical anterior wall, septal true apex and inferior apical wall motion. Quantitative EF 36%. 2. Diffuse sub endocardial and mid myocardial uptake particularly in the septum and mid/basal anterior wall not in coronary distribution 3. Elevated ECV and T2 along with gadolinium uptake suggests myocarditis 4.  Normal RV size and function RVEF 56% no evidence of RV dysplasia 5.  Small pericardial effusion 6.  Dilated  aortic root 4.0 cm  Echocardiogram 10/03/2021: 1. Moderate LVE with mid/apical anterior wall, septal true apex and inferior apical wall motion. Quantitative EF 36%. 2. Diffuse sub endocardial and mid myocardial uptake particularly in the septum and mid/basal anterior wall not in coronary distribution 3. Elevated ECV and T2 along with gadolinium uptake suggests myocarditis 4.  Normal RV size and function RVEF 56% no evidence of RV dysplasia 5.  Small pericardial effusion 6.  Dilated aortic root 4.0 cm  Recent labs: 11/22/2021: Glucose 220, BUN/Cr  16/1.24. EGFR 65. Na/K 140/3.9. Rest of the CMP normal TSH 1.9 normal  10/05/2021: Glucose 174, BUN/Cr 14/0.86. EGFR >60. Na/K 130/3.8. Rest of the CMP normal H/H 14/44. MCV 87. Platelets 104 HbA1C 12.5% Chol 180, TG 189, HDL 48, LDL 94   Review of Systems  Constitutional: Positive for diaphoresis.  Cardiovascular:  Negative for chest pain, dyspnea on exertion, leg swelling, palpitations and syncope.        Vitals:   11/23/21 1123  BP: 134/77  Pulse: (!) 58  Resp: 17  Temp: 98 F (36.7 C)  SpO2: 97%    Body mass index is 33.65 kg/m. Filed Weights   11/23/21 1123  Weight: 250 lb 3.2 oz (113.5 kg)    Objective:   Physical Exam Vitals and nursing note reviewed.  Constitutional:      General: He is not in acute distress.    Comments: LifeVest in place  Neck:     Vascular: No JVD.  Cardiovascular:     Rate and Rhythm: Normal rate and regular rhythm.     Heart sounds: Normal heart sounds. No murmur heard. Pulmonary:     Effort: Pulmonary effort is normal.     Breath sounds: Normal breath sounds. No wheezing or rales.  Musculoskeletal:     Right lower leg: No edema.     Left lower leg: No edema.          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 He is currently wearing LifeVest with no recurrence of VF/VT. Symptomatically, he is doing very well without any dyspnea, leg edema, orthopnea, PND. Continue medical management with Entresto 24-26 mg twice daily, metoprolol succinate 12.5 mg daily. Also continue mexiletine 150 mg 3 times daily, amiodarone 200 mg daily.  Given patient's financial situation, lack of employment and insurance, I do not think he is realistically going to be able to undergo cardiac MRI.  As a reasonable alternative, I recommend  performing echocardiogram next month, which will be 3 months from his myocarditis.  If EF is recovered and he remains asymptomatic, I think he can return to work sometime in April or May. Continue wearing LifeVest at least until after his echocardiogram in March 2023. I will forward my note to EP.  CAD: Diffuse, moderate disease in LAD. Continue Asprin 81 mg, Crestor 20 mg, diabetes control.  Type 2 DM: New diagnosis during 09/2021 hospitalization. A1C 12.6%. Diabetes remains uncontrolled.  He is currently not taking metformin due to GI side effects, is taking insulin.   Recommend close follow-up with PCP for aggressive diabetes control.    F/u in June 2022   Nigel Mormon, MD Pager: [phone] Office: [phone]

## 2021-11-25 ENCOUNTER — Encounter: Payer: Self-pay | Admitting: Cardiology

## 2021-11-25 NOTE — Progress Notes (Signed)
° ° °  S:    Daniel [name] is a 66 y.o. male who presents for diabetes evaluation, education, and management. PMH is significant for T2DM, HTN, HFrEF, CAD. Patient was referred and last seen by Primary Care Provider, Juluis Mire, NP, on 10/30/21. At last visit, metformin and Jardiance were started.   Today, patient arrives in good spirits and presents without assistance. He was not able to tolerate metformin due to nausea. Tolerating Jardiance well. Has not been taking insulin prior to a meal. He is trying to keep his meal and medications on a schedule to match what his 2nd shift work schedule will be once he is able to go back to work.   Family/Social History:  -MI in father -Never smoker  Current diabetes medications include: metformin 1000 mg BID (not taking d/t nausea), Jardiance 25 mg daily, Novolog 70/30 22 units BID Current hyperlipidemia medications include: rosuvastatin 20 mg daily  Reports adherence to medications (with the exception of metformin, as above). Of the last 7 days, has missed his insulin twice (takes BID).   Patient denies hypoglycemic events.  Checks 1pm and 1am (eats 9am and 9pm) Reported 4 hour post-meal/random blood sugars: 170s-270s. 240 is most often. Lowest 146 x1.  Patient reports nocturia (nighttime urination). 2-4x/night.  Patient reports neuropathy (nerve pain). Tingling in feet.  Patient denies visual changes. Patient reports self foot exams.   Patient reported dietary habits: Eats 2 meals/day (9am and 9pm). Eats Healthy Choice meals.   O:   Lab Results  Component Value Date   HGBA1C 12.5 (H) 10/02/2021   There were no vitals filed for this visit.  Lipid Panel     Component Value Date/Time   CHOL 180 10/02/2021 1327   TRIG 189 (H) 10/02/2021 1327   HDL 48 10/02/2021 1327   CHOLHDL 3.8 10/02/2021 1327   VLDL 38 10/02/2021 1327   LDLCALC 94 10/02/2021 1327    Clinical Atherosclerotic Cardiovascular Disease (ASCVD): Yes  The 10-year  ASCVD risk score (Arnett DK, et al., 2019) is: 23.6%   Values used to calculate the score:     Age: 41 years     Sex: Male     Is Non-Hispanic African American: No     Diabetic: Yes     Tobacco smoker: No     Systolic Blood Pressure: [id number] mmHg     Is BP treated: Yes     HDL Cholesterol: 48 mg/dL     Total Cholesterol: 180 mg/dL    A/P: Diabetes currently uncontrolled with most recent A1c 12.5 on 10/02/21. Patient is able to verbalize appropriate hypoglycemia management plan. Medication adherence appears appropriate. Control is suboptimal due to requiring additional BG lowering agents.  -Start Ozempic 0.25 mg once weekly.  -Continue Jardiance 25 mg daily and Novolog 70/30 22 units BID.  -Patient educated on purpose, proper use, and potential adverse effects of Ozempic.  -Counseled him to take his insulin BEFORE meals and to start checking BG before meals (fasting) and 2 hours after eating.  -Extensively discussed pathophysiology of diabetes, recommended lifestyle interventions, dietary effects on blood sugar control.  -Counseled on s/sx of and management of hypoglycemia.  -Next A1c anticipated March 2023.   Written patient instructions provided. Patient verbalized understanding of treatment plan. Total time in face to face counseling 30 minutes.    Follow up PCP clinic visit 01/01/22.   Rebbeca Paul, PharmD PGY2 Ambulatory Care Pharmacy Resident 11/27/2021 3:24 PM

## 2021-11-26 ENCOUNTER — Encounter (HOSPITAL_COMMUNITY): Payer: Self-pay | Admitting: Emergency Medicine

## 2021-11-26 ENCOUNTER — Emergency Department (HOSPITAL_COMMUNITY): Payer: Medicaid Other

## 2021-11-26 ENCOUNTER — Emergency Department (HOSPITAL_COMMUNITY)
Admission: EM | Admit: 2021-11-26 | Discharge: 2021-11-26 | Disposition: A | Discharge status: Home / Self Care | Payer: Medicaid Other | Attending: Emergency Medicine | Admitting: Emergency Medicine

## 2021-11-26 ENCOUNTER — Other Ambulatory Visit: Payer: Self-pay

## 2021-11-26 DIAGNOSIS — I2510 Atherosclerotic heart disease of native coronary artery without angina pectoris: Secondary | ICD-10-CM | POA: Insufficient documentation

## 2021-11-26 DIAGNOSIS — I509 Heart failure, unspecified: Secondary | ICD-10-CM | POA: Diagnosis not present

## 2021-11-26 DIAGNOSIS — Z794 Long term (current) use of insulin: Secondary | ICD-10-CM | POA: Insufficient documentation

## 2021-11-26 DIAGNOSIS — J45909 Unspecified asthma, uncomplicated: Secondary | ICD-10-CM | POA: Insufficient documentation

## 2021-11-26 DIAGNOSIS — L0291 Cutaneous abscess, unspecified: Secondary | ICD-10-CM

## 2021-11-26 DIAGNOSIS — I110 Hypertensive heart disease with heart failure: Secondary | ICD-10-CM | POA: Insufficient documentation

## 2021-11-26 DIAGNOSIS — N501 Vascular disorders of male genital organs: Secondary | ICD-10-CM | POA: Diagnosis present

## 2021-11-26 DIAGNOSIS — E119 Type 2 diabetes mellitus without complications: Secondary | ICD-10-CM | POA: Diagnosis not present

## 2021-11-26 DIAGNOSIS — K4090 Unilateral inguinal hernia, without obstruction or gangrene, not specified as recurrent: Secondary | ICD-10-CM | POA: Insufficient documentation

## 2021-11-26 DIAGNOSIS — R7989 Other specified abnormal findings of blood chemistry: Secondary | ICD-10-CM | POA: Insufficient documentation

## 2021-11-26 DIAGNOSIS — Z7984 Long term (current) use of oral hypoglycemic drugs: Secondary | ICD-10-CM | POA: Insufficient documentation

## 2021-11-26 DIAGNOSIS — Z7982 Long term (current) use of aspirin: Secondary | ICD-10-CM | POA: Diagnosis not present

## 2021-11-26 DIAGNOSIS — L03818 Cellulitis of other sites: Secondary | ICD-10-CM

## 2021-11-26 LAB — URINALYSIS, ROUTINE W REFLEX MICROSCOPIC
Bilirubin Urine: NEGATIVE
Glucose, UA: >=500 mg/dL — AB
Hgb urine dipstick: NEGATIVE
Ketones, ur: NEGATIVE mg/dL
Leukocytes,Ua: NEGATIVE
Nitrite: NEGATIVE
Protein, ur: NEGATIVE mg/dL
Specific Gravity, Urine: <1.005 — ABNORMAL LOW (ref 1.005–1.030)
pH: 6 (ref 5.0–8.0)

## 2021-11-26 LAB — COMPREHENSIVE METABOLIC PANEL
ALT: 24 U/L (ref 0–44)
AST: 23 U/L (ref 15–41)
Albumin: 3.8 g/dL (ref 3.5–5.0)
Alkaline Phosphatase: 73 U/L (ref 38–126)
Anion gap: 12 (ref 5–15)
BUN: 11 mg/dL (ref 8–23)
CO2: 27 mmol/L (ref 22–32)
Calcium: 8.9 mg/dL (ref 8.9–10.3)
Chloride: 99 mmol/L (ref 98–111)
Creatinine, Ser: 1.3 mg/dL — ABNORMAL HIGH (ref 0.61–1.24)
GFR, Estimated: >60 mL/min (ref >60)
Glucose, Bld: 139 mg/dL — ABNORMAL HIGH (ref 70–99)
Potassium: 3.9 mmol/L (ref 3.5–5.1)
Sodium: 138 mmol/L (ref 135–145)
Total Bilirubin: 1 mg/dL (ref 0.3–1.2)
Total Protein: 7 g/dL (ref 6.5–8.1)

## 2021-11-26 LAB — URINALYSIS, MICROSCOPIC (REFLEX): Bacteria, UA: NONE SEEN

## 2021-11-26 LAB — CBC
HCT: 44.1 % (ref 39.0–52.0)
Hemoglobin: 14.6 g/dL (ref 13.0–17.0)
MCH: 29.6 pg (ref 26.0–34.0)
MCHC: 33.1 g/dL (ref 30.0–36.0)
MCV: 89.5 fL (ref 80.0–100.0)
Platelets: 159 10*3/uL (ref 150–400)
RBC: 4.93 MIL/uL (ref 4.22–5.81)
RDW: 13.3 % (ref 11.5–15.5)
WBC: 7 10*3/uL (ref 4.0–10.5)
nRBC: 0 % (ref 0.0–0.2)

## 2021-11-26 IMAGING — CT CT PELVIS W/ CM
2 of 4 series · 17 of 46 positions shown, 19 images · IV contrast (APPLIED)
Comparison: None.

CLINICAL DATA: Hernia, scrotal abscess

EXAM:
CT PELVIS WITH CONTRAST
TECHNIQUE: Multidetector CT imaging of the pelvis was performed using the
standard protocol following the bolus administration of intravenous
contrast.

[Series 3: abd/ pelvis 5.0 i30f 2 · axial · 0.94mm/px · z∈[+512,+982]mm · 14 of 102 slices shown, 16 images]
[im 4/102  soft-tissue]
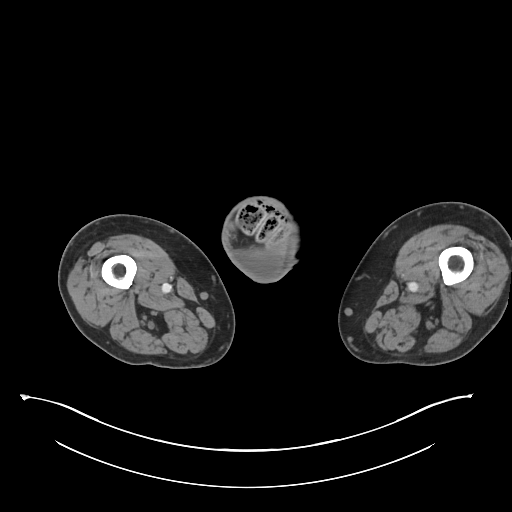
[im 4/102  bone]
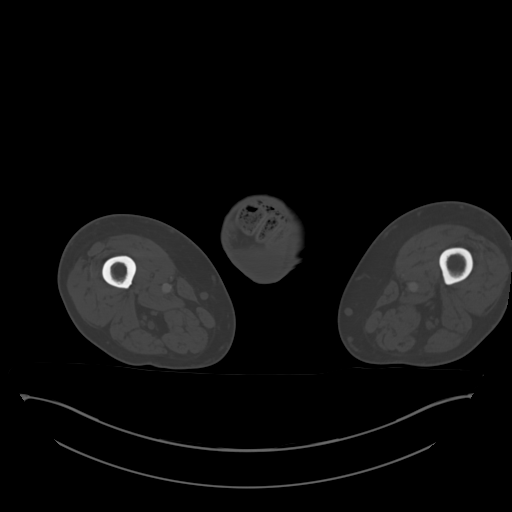
[im 11/102  soft-tissue]
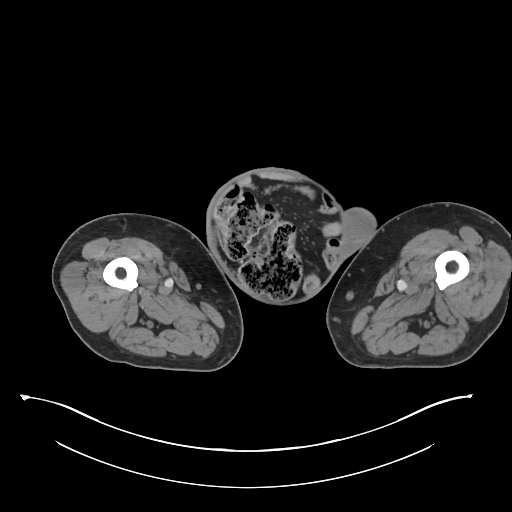
[im 19/102  soft-tissue]
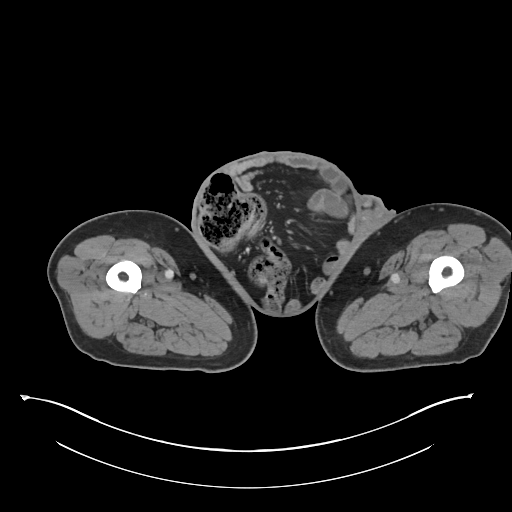
[im 26/102  soft-tissue]
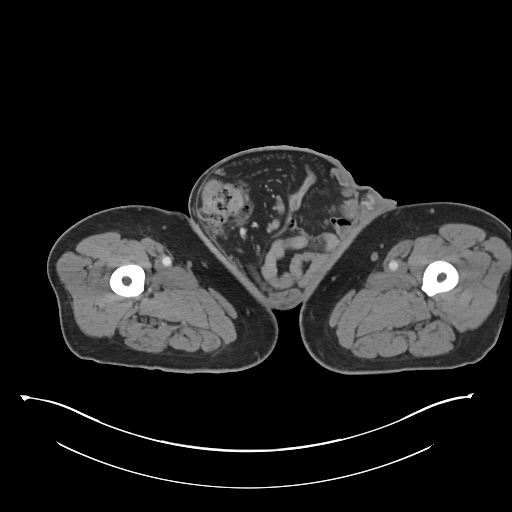
[im 33/102  soft-tissue]
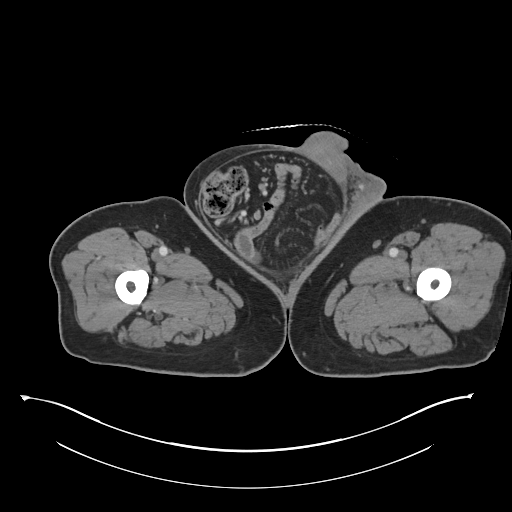
[im 40/102  soft-tissue]
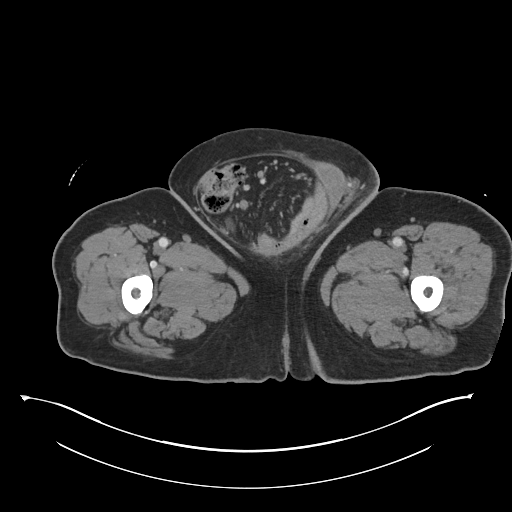
[im 47/102  soft-tissue]
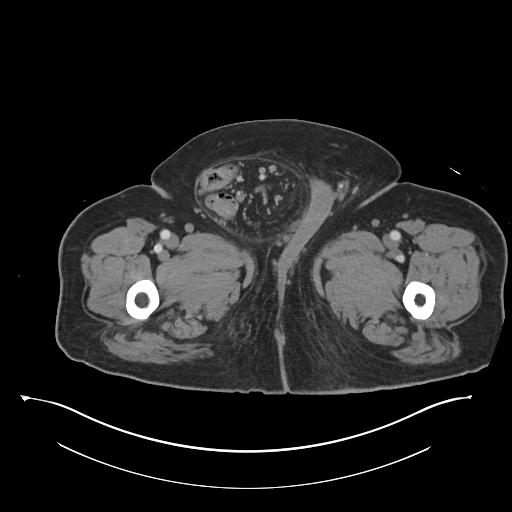
[im 55/102  soft-tissue]
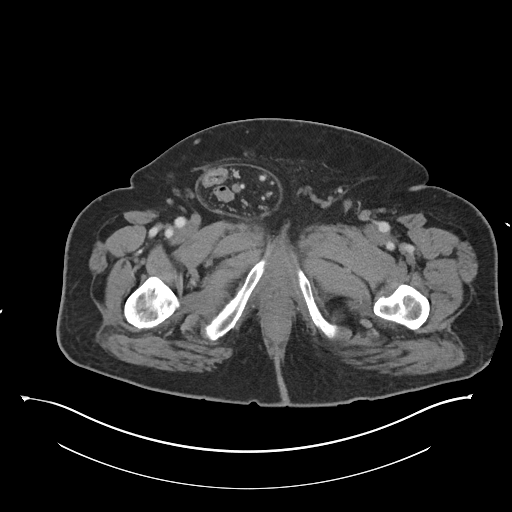
[im 62/102  soft-tissue]
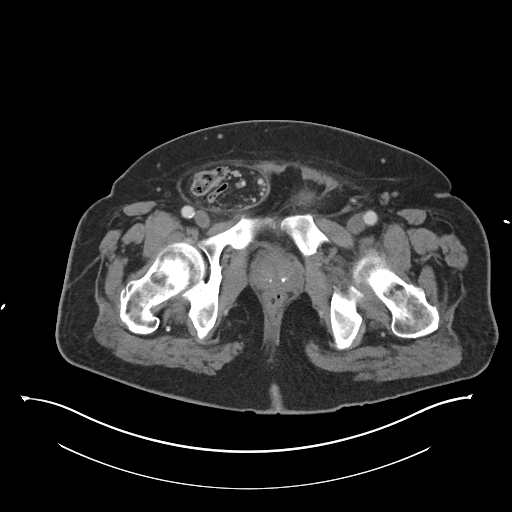
[im 62/102  bone]
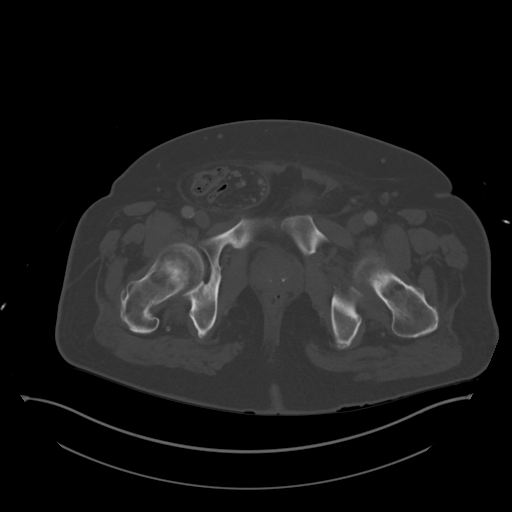
[im 69/102  soft-tissue]
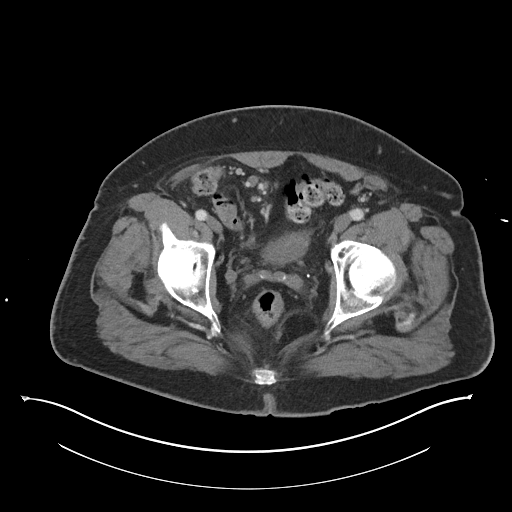
[im 76/102  soft-tissue]
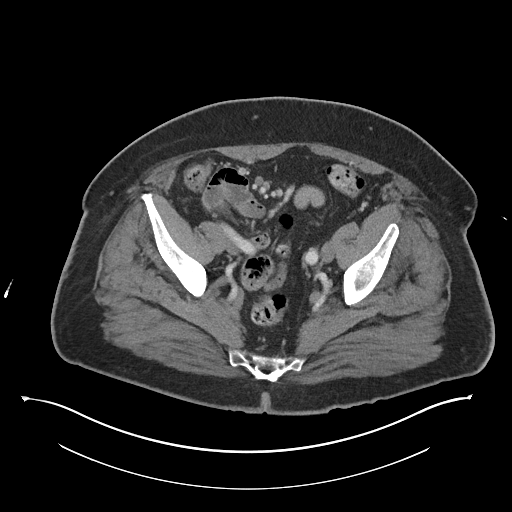
[im 83/102  soft-tissue]
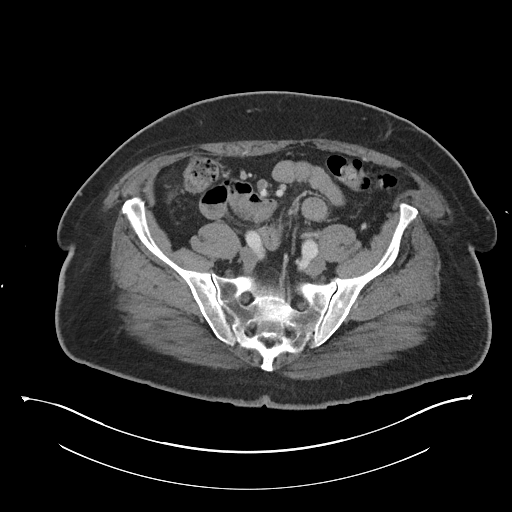
[im 91/102  soft-tissue]
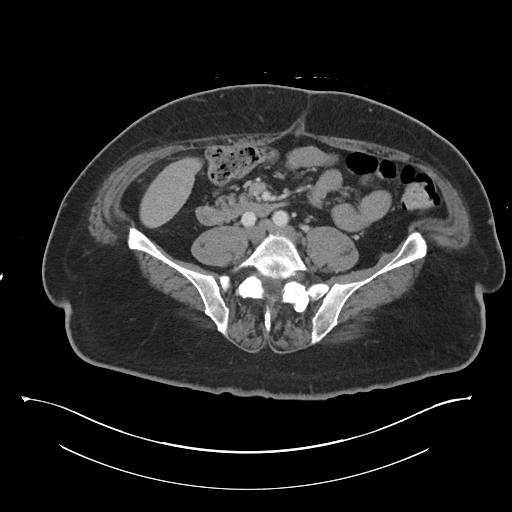
[im 98/102  soft-tissue]
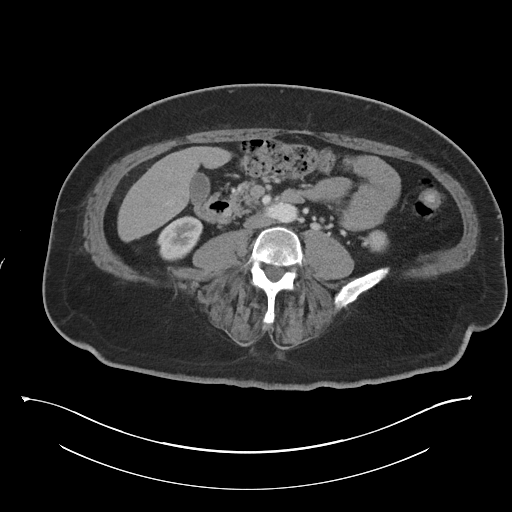

[Series 5: coronal soft tissue · coronal · 1.01mm/px · 3 of 101 slices shown]
[im 34/101  soft-tissue]
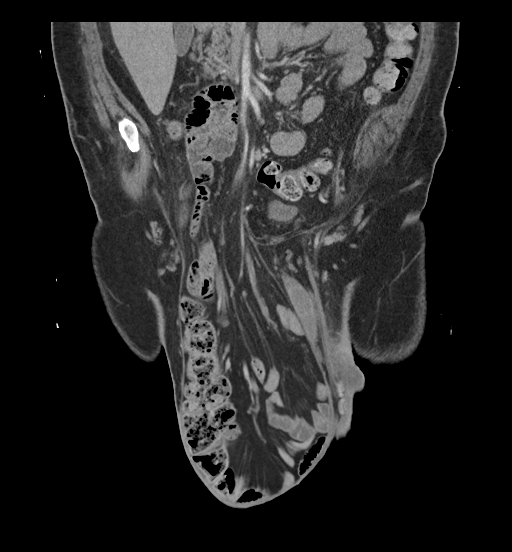
[im 45/101  soft-tissue]
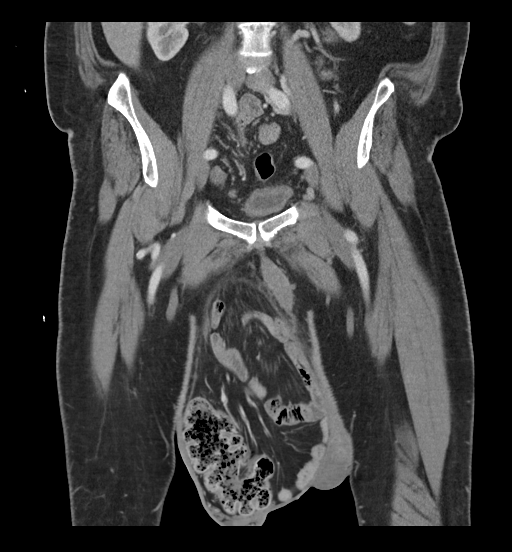
[im 56/101  soft-tissue]
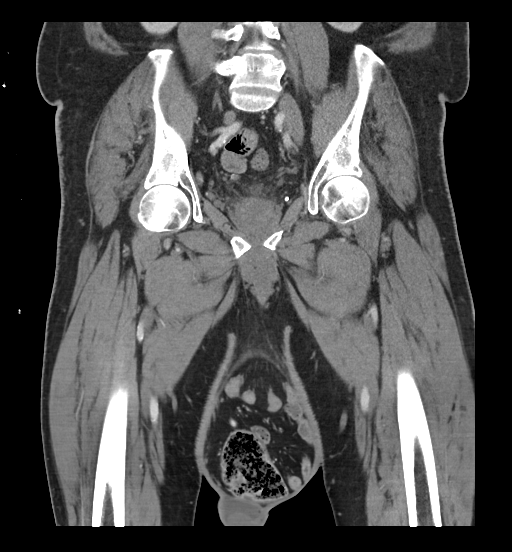

[17 of 46 positions shown; findings below may reference images not displayed]

RADIATION DOSE REDUCTION: This exam was performed according to the
departmental dose-optimization program which includes automated
exposure control, adjustment of the mA and/or kV according to
patient size and/or use of iterative reconstruction technique.

CONTRAST:  100mL OMNIPAQUE IOHEXOL 300 MG/ML  SOLN
FINDINGS: Urinary Tract: Urinary bladder is incompletely distended with no
obvious mass visualized.

Bowel: No evidence of bowel obstruction or bowel wall edema.
Numerous loops of bowel visualized within the right inguinal hernia
which extends into the scrotum, including loops of small bowel,
right colon, and appendix.

Vascular/Lymphatic: No pathologically enlarged lymph nodes. No
significant vascular abnormality seen.

Reproductive:  Prostate gland is mildly enlarged.

Other: No ascites. The right inguinal hernia sac measures up to 15 x
15 x 28.7 cm in total size.

Musculoskeletal: No suspicious bony lesions. No abnormal fluid
collections visualized in the soft tissues including in the scrotal
wall.
IMPRESSION: 1. No acute abnormality identified.
2. Large right inguinal hernia which extends into the scrotum and
contains numerous loops of small bowel and the right colon.

## 2021-11-26 IMAGING — US US SCROTUM W/ DOPPLER COMPLETE
1 series · 13 of 25 positions shown · non-contrast
Comparison: CT scan earlier same day

CLINICAL DATA: Cellulitis.

EXAM:
SCROTAL ULTRASOUND
DOPPLER ULTRASOUND OF THE TESTICLES
TECHNIQUE: Complete ultrasound examination of the testicles, epididymis, and
other scrotal structures was performed. Color and spectral Doppler
ultrasound were also utilized to evaluate blood flow to the
testicles.

[Series 1: us scrotum · 67 acquisitions, 13 frames shown]
[im 1/67]
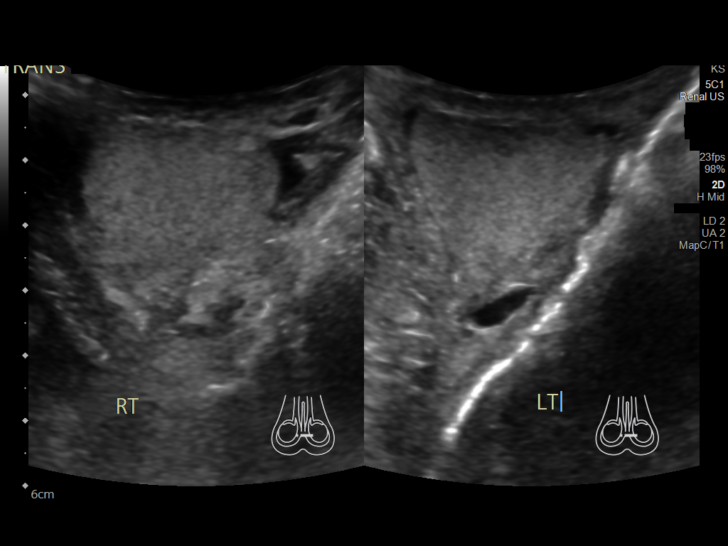
[im 6/67]
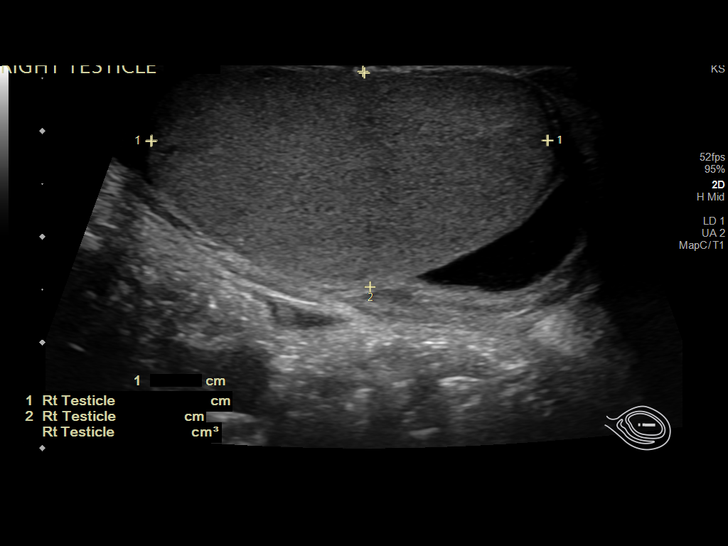
[im 12/67]
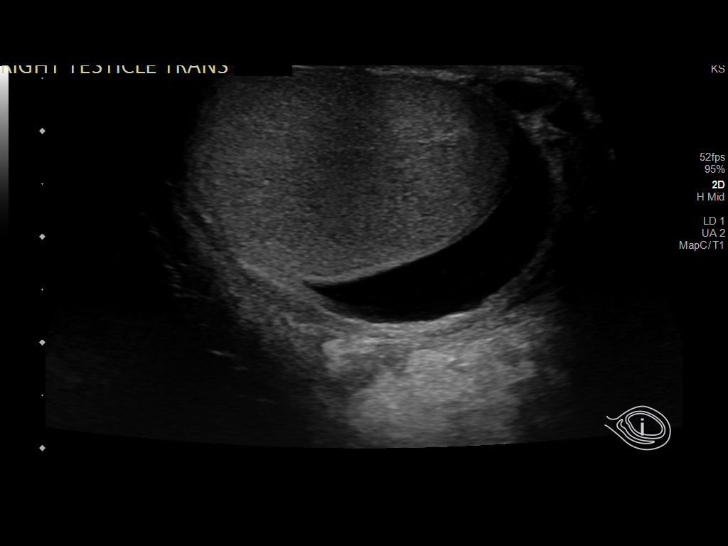
[im 17/67]
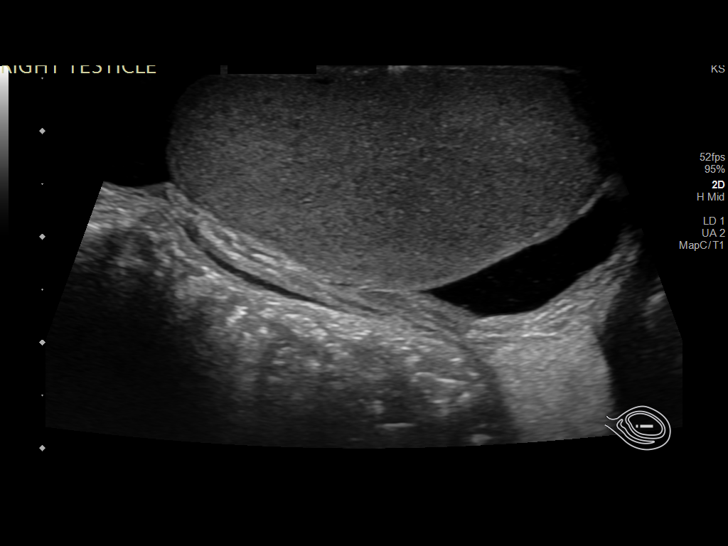
[im 23/67]
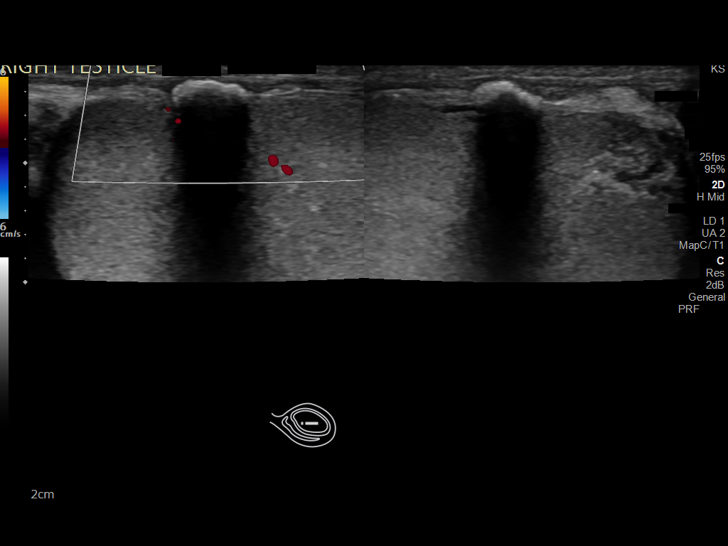
[im 28/67]
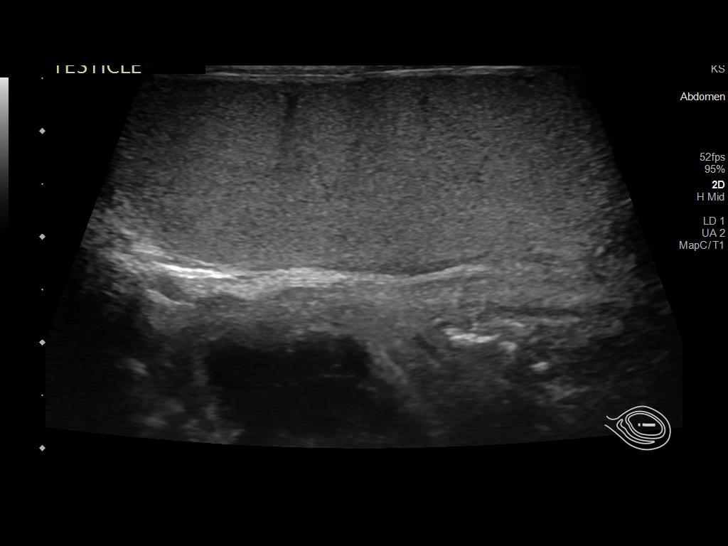
[im 34/67]
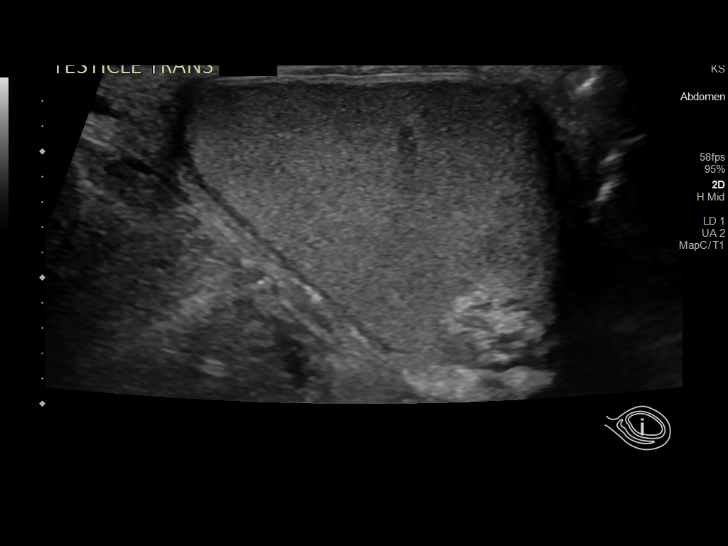
[im 39/67]
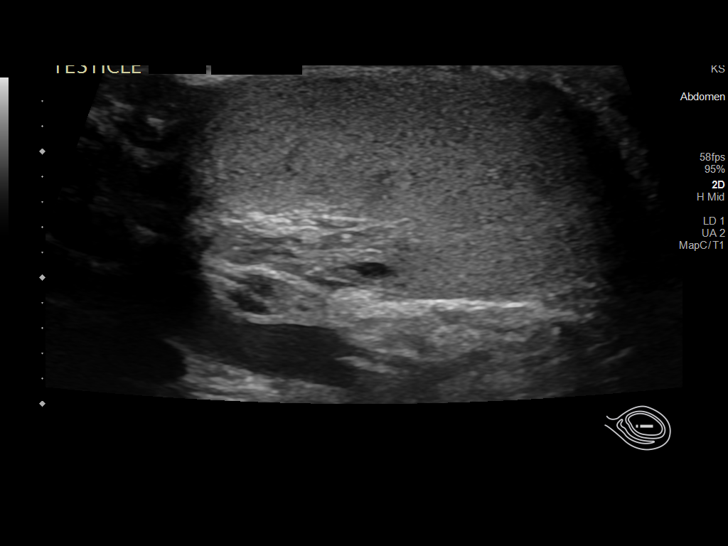
[im 45/67]
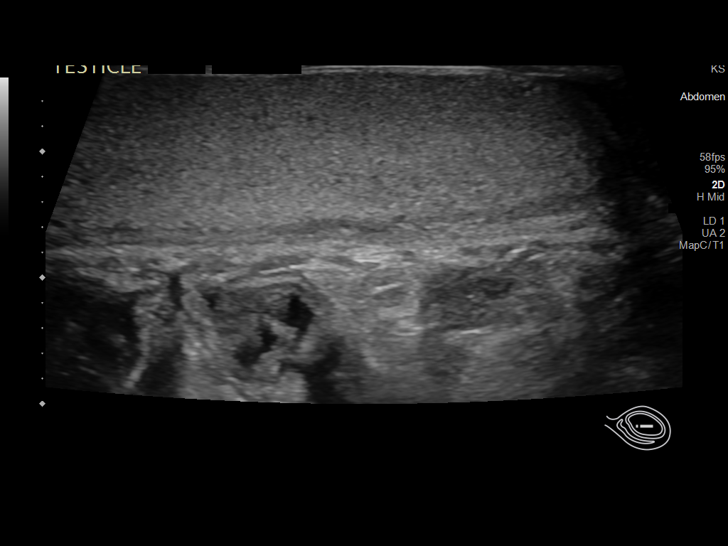
[im 50/67]
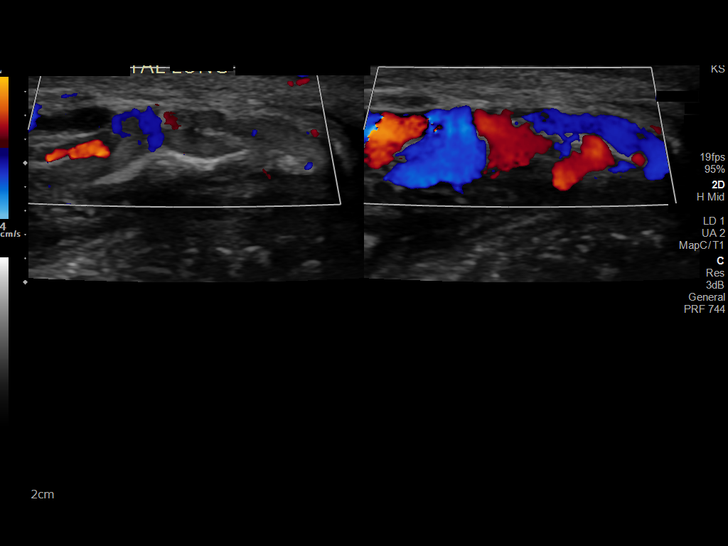
[im 56/67]
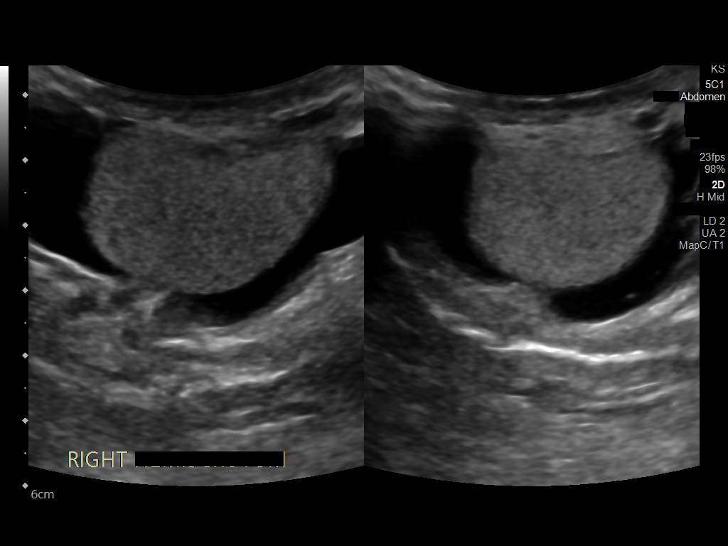
[im 61/67]
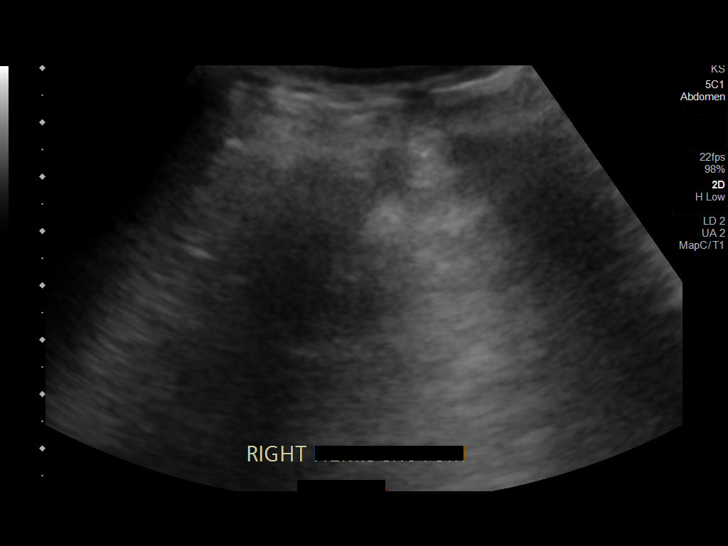
[im 67/67]
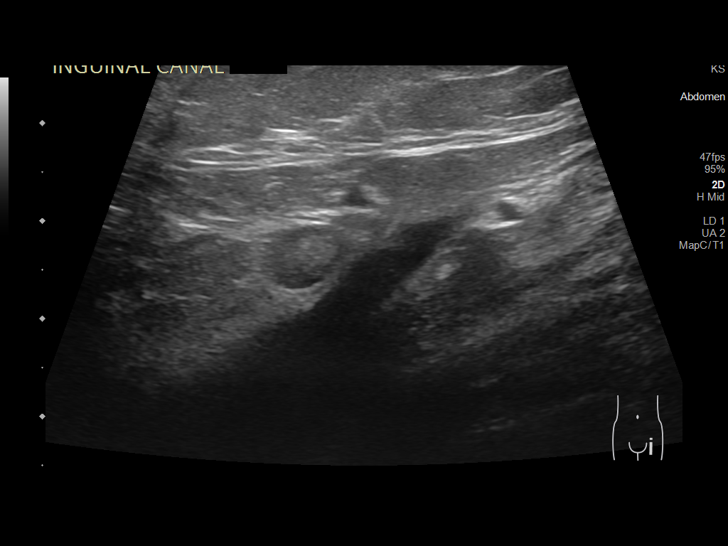

[13 of 25 positions shown; findings below may reference images not displayed]

FINDINGS: Right testicle

Measurements: 3.8 x 2.0 x 2.6 cm. 6 mm calcification identified at
or in the tunic albuginea along the superolateral aspect of the
testicle. No intratesticular mass lesion.

Left testicle

Measurements: 4.6 x 1.8 x 2.5 cm. No mass or microlithiasis
visualized.

Right epididymis:  Normal in size and appearance.

Left epididymis:  Normal in size and appearance.

Hydrocele: There is some fluid in the right hemiscrotum,
indeterminate given the large right inguinal hernia.

Varicocele: Prominent venous anatomy measuring greater than 3 mm
diameter noted in each hemiscrotum compatible with bilateral
varicocele.

Pulsed Doppler interrogation of both testes demonstrates normal low
resistance arterial and venous waveforms bilaterally.
IMPRESSION: 1. No evidence for testicular torsion. No evidence for
epididymo-orchitis.
2. Large right inguinal hernia containing small bowel loops and
colon better characterized on CT scan earlier today.

## 2021-11-26 MED ORDER — DOXYCYCLINE HYCLATE 100 MG PO CAPS: 100.0000 mg | ORAL_CAPSULE | Freq: Two times a day (BID) | ORAL | 0 refills | Status: DC

## 2021-11-26 MED ORDER — DOXYCYCLINE HYCLATE 100 MG PO TABS
100.0000 mg | ORAL_TABLET | Freq: Two times a day (BID) | ORAL | 0 refills | Status: AC
  Filled 2021-11-26: qty 14, 7d supply, fill #0

## 2021-11-26 MED ORDER — VANCOMYCIN HCL IN DEXTROSE 1-5 GM/200ML-% IV SOLN: 1000.0000 mg | Freq: Once | INTRAVENOUS | Status: DC

## 2021-11-26 MED ORDER — DOXYCYCLINE HYCLATE 100 MG PO TABS
100.0000 mg | ORAL_TABLET | Freq: Once | ORAL | Status: AC
Start: 2021-11-26 — End: 2021-11-26
  Administered 2021-11-26: 100 mg via ORAL
  Filled 2021-11-26: qty 1

## 2021-11-26 MED ORDER — IOHEXOL 300 MG/ML  SOLN
100.0000 mL | Freq: Once | INTRAMUSCULAR | Status: AC | PRN
  Administered 2021-11-26: 100 mL via INTRAVENOUS

## 2021-11-26 NOTE — ED Provider Notes (Signed)
[facility] EMERGENCY DEPARTMENT Provider Note   CSN: [id number] Arrival date & time: 11/26/21  1142     History  Chief Complaint  Patient presents with   Hernia    Daniel [name] is a 66 y.o. male.  HPI   66 y/o male wit ha h/o asthma, htn, dm, vtach, vfib, chf, cad, who presents to the emergency department today for evaluation of swelling and discomfort to the left scrotal area.  He notes that he has had an inguinal hernia that has caused testicular swelling on the right side for many years.  He has no pain to this area but a few days ago he noticed some swelling on the left side of the scrotum.  He noted that he had some blood on his leg and the left side of his scrotum this morning and is not sure where it originated from.  He denies any fevers or chills at home.  Denies any other systemic symptoms.  Home Medications Prior to Admission medications   Medication Sig Start Date End Date Taking? Authorizing Provider  doxycycline (VIBRAMYCIN) 100 MG capsule Take 1 capsule (100 mg total) by mouth 2 (two) times daily for 7 days. 11/26/21 12/03/21 Yes Geovanni Rahming S, PA-C  amiodarone (PACERONE) 200 MG tablet Take 1 tablet (200 mg total) by mouth daily. 11/22/21   Graciella Freer, PA-C  aspirin 81 MG EC tablet Take 1 tablet (81 mg total) by mouth daily. 10/13/21   Patwardhan, Anabel Bene, MD  empagliflozin (JARDIANCE) 25 MG TABS tablet Take 1 tablet (25 mg total) by mouth daily before breakfast. 10/30/21   Grayce Sessions, NP  insulin aspart protamine - aspart (NOVOLOG MIX 70/30 FLEXPEN) (70-30) 100 UNIT/ML FlexPen Inject 22 Units into the skin 2 (two) times daily. 10/29/21   Kommor, [place], MD  Insulin Pen Needle 32G X 4 MM MISC Use to inject insulin twice daily. 10/05/21   Patwardhan, Anabel Bene, MD  metFORMIN (GLUCOPHAGE) 1000 MG tablet Take 1 tablet (1,000 mg total) by mouth 2 (two) times daily with a meal. 10/30/21   Grayce Sessions, NP  metoprolol succinate  (TOPROL-XL) 25 MG 24 hr tablet Take 1 tablet (25 mg total) by mouth daily. 11/22/21   Graciella Freer, PA-C  mexiletine (MEXITIL) 150 MG capsule Take 1 capsule (150 mg total) by mouth every 8 (eight) hours. 11/01/21   Patwardhan, Anabel Bene, MD  Multiple Vitamin (MULTIVITAMIN WITH MINERALS) TABS tablet Take 1 tablet by mouth daily.    [provider]  rosuvastatin (CRESTOR) 20 MG tablet Take 1 tablet (20 mg total) by mouth daily. 11/01/21   Patwardhan, Anabel Bene, MD  sacubitril-valsartan (ENTRESTO) 24-26 MG Take 1 tablet by mouth 2 (two) times daily. 11/01/21   Patwardhan, Anabel Bene, MD  simethicone (MYLICON) 125 MG chewable tablet Chew 125 mg by mouth every 6 (six) hours as needed for flatulence.    [provider]  torsemide (DEMADEX) 20 MG tablet Take 1 tablet (20 mg total) by mouth 2 (two) times daily. 11/01/21   Patwardhan, Anabel Bene, MD      Allergies    Metformin and related    Review of Systems   Review of Systems See HPI for pertinent positives or negatives.   Physical Exam Updated Vital Signs BP 116/68    Pulse (!) 53    Temp 99.3 F (37.4 C) (Oral)    Resp 16    SpO2 90%  Physical Exam Vitals and nursing note reviewed.  Constitutional:      General: He is not in acute distress.    Appearance: He is well-developed.  HENT:     Head: Normocephalic and atraumatic.  Eyes:     Conjunctiva/sclera: Conjunctivae normal.  Cardiovascular:     Rate and Rhythm: Normal rate and regular rhythm.     Heart sounds: No murmur heard. Pulmonary:     Effort: Pulmonary effort is normal. No respiratory distress.     Breath sounds: Normal breath sounds.  Abdominal:     Palpations: Abdomen is soft.     Tenderness: There is no abdominal tenderness.  Genitourinary:    Comments: Chaperone present. Circumcised male, penis appears wnl. 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 There is a small area of fluctuance noted and there is dried  blood noted to the scrotum and leg. No obvious crepitus noted. Musculoskeletal:        General: No swelling.     Cervical back: Neck supple.  Skin:    General: Skin is warm and dry.     Capillary Refill: Capillary refill takes less than 2 seconds.  Neurological:     Mental Status: He is alert.  Psychiatric:        Mood and Affect: Mood normal.     ED Results / Procedures / Treatments   Labs (all labs ordered are listed, but only abnormal results are displayed) Labs Reviewed  COMPREHENSIVE METABOLIC PANEL - Abnormal; Notable for the following components:      Result Value   Glucose, Bld 139 (*)    Creatinine, Ser 1.30 (*)    All other components within normal limits  URINALYSIS, ROUTINE W REFLEX MICROSCOPIC - Abnormal; Notable for the following components:   Specific Gravity, Urine <1.005 (*)    Glucose, UA >=500 (*)    All other components within normal limits  CBC  URINALYSIS, MICROSCOPIC (REFLEX)    EKG None  Radiology CT PELVIS W CONTRAST  Result Date: 11/26/2021 CLINICAL DATA:  Hernia, scrotal abscess EXAM: CT PELVIS WITH CONTRAST TECHNIQUE: Multidetector CT imaging of the pelvis was performed using the standard protocol following the bolus administration of intravenous contrast. RADIATION DOSE REDUCTION: This exam was performed according to the departmental dose-optimization program which includes automated exposure control, adjustment of the mA and/or kV according to patient size and/or use of iterative reconstruction technique. CONTRAST:  OMNIPAQUE IOHEXOL 300 MG/ML  SOLN COMPARISON:  None. FINDINGS: Urinary Tract: Urinary bladder is incompletely distended with no obvious mass visualized. Bowel: No evidence of bowel obstruction or bowel wall edema. Numerous loops of bowel visualized within the right inguinal hernia which extends into the scrotum, including loops of small bowel, right colon, and  appendix. Vascular/Lymphatic: No pathologically enlarged lymph nodes. No significant vascular abnormality seen. Reproductive:  Prostate gland is mildly enlarged. Other: No ascites. The right inguinal hernia sac measures up to 15 x 15 x 28.7 cm in total size. Musculoskeletal: No suspicious bony lesions. No abnormal fluid collections visualized in the soft tissues including in the scrotal wall. IMPRESSION: 1. No acute abnormality identified. 2. Large right inguinal hernia which extends into the scrotum and contains numerous loops of small bowel and the right colon. Electronically Signed   By: Jannifer Hick M.D.   On: 11/26/2021 14:19    Procedures Procedures    Medications Ordered in ED Medications  doxycycline (VIBRA-TABS) tablet 100 mg (  has no administration in time range)  iohexol (OMNIPAQUE) 300 MG/ML solution 100 mL (100 mLs Intravenous Contrast Given 11/26/21 1406)    ED Course/ Medical Decision Making/ A&P                           Medical Decision Making Amount and/or Complexity of Data Reviewed Labs: ordered. Radiology: ordered.  Risk Prescription drug management.   This patient presents to the ED for concern of scrotal swelling, this involves an extensive number of treatment options, and is a complaint that carries with it a high risk of complications and morbidity.  The differential diagnosis includes but is not limited to inguinal hernia complicated or uncomplicated, nec fasciitis, scrotal abscess, torsion, epididymitis, orchidis, cellulitis  Comorbidities that complicate the patient evaluation: Patients presentation is complicated by their history of inguinal hernia, dm  Social Determinants of Health: Patients lack of insurance  increases the complexity of managing their presentation  Additional history obtained: Records reviewed previous admission documents and Care Everywhere/External Records  Lab Tests: I Ordered, and personally interpreted labs.  The pertinent  results include:   CBC which showed no leukocytosis or anemia CMP with mildly elevated cr, otherwise reassuring UA with glucosuria, otherwise no evidence of infection  Imaging Studies ordered: I ordered, independently visualized, and interpreted imaging which showed  CT pelvis - 1. No acute abnormality identified. 2. Large right inguinal hernia which extends into the scrotum and contains numerous loops of small bowel and the right colon. Scrotal UA - pending at shift change  I agree with the radiologist interpretation  Medicines ordered and prescription drug management: I ordered medication including doxycycline  for purulent cellulitis   Critical Interventions: antibiotics  Complexity of problems addressed: Patients presentation is most consistent with  acute complicated illness/injury requiring diagnostic workup  Care transitioned to Lyndel Safe, PA-C to f/u on scrotal US. Anticipate discharge with antibiotics  Final Clinical Impression(s) / ED Diagnoses Final diagnoses:  None    Rx / DC Orders ED Discharge Orders          Ordered    doxycycline (VIBRAMYCIN) 100 MG capsule  2 times daily        11/26/21 1518              Chimere Klingensmith S, PA-C 11/26/21 1604    Gloris Manchester, MD 11/26/21 2252

## 2021-11-26 NOTE — ED Triage Notes (Signed)
Pt reports discomfort at hernia last night.  States he tried to push it back in place.  Woke up with small amount of blood in underwear this morning that he states may have came from penis.  Denies pain.

## 2021-11-26 NOTE — ED Provider Notes (Signed)
° °  ED Course / MDM    Medical Decision Making Amount and/or Complexity of Data Reviewed Labs: ordered. Radiology: ordered. ECG/medicine tests: ordered.  Risk Prescription drug management.   I assumed care of patient at shift change from previous team, please see their note for full H&P. Briefly patient is here for evaluation of possible scrotal infection. At the time of shift change plan is to follow-up on ultrasound. Ultrasound result.  Patient will be discharged with doxycycline which was prescribed by previous team.  CT PELVIS W CONTRAST  Result Date: 11/26/2021 CLINICAL DATA:  Hernia, scrotal abscess EXAM: CT PELVIS WITH CONTRAST TECHNIQUE: Multidetector CT imaging of the pelvis was performed using the standard protocol following the bolus administration of intravenous contrast. RADIATION DOSE REDUCTION: This exam was performed according to the departmental dose-optimization program which includes automated exposure control, adjustment of the mA and/or kV according to patient size and/or use of iterative reconstruction technique. CONTRAST:  OMNIPAQUE IOHEXOL 300 MG/ML  SOLN COMPARISON:  None. FINDINGS: Urinary Tract: Urinary bladder is incompletely distended with no obvious mass visualized. Bowel: No evidence of bowel obstruction or bowel wall edema. Numerous loops of bowel visualized within the right inguinal hernia which extends into the scrotum, including loops of small bowel, right colon, and appendix. Vascular/Lymphatic: No pathologically enlarged lymph nodes. No significant vascular abnormality seen. Reproductive:  Prostate gland is mildly enlarged. Other: No ascites. The right inguinal hernia sac measures up to 15 x 15 x 28.7 cm in total size. Musculoskeletal: No suspicious bony lesions. No abnormal fluid collections visualized in the soft tissues including in the scrotal wall. IMPRESSION: 1. No acute abnormality identified. 2. Large right inguinal hernia which extends into the  scrotum and contains numerous loops of small bowel and the right colon. Electronically Signed   By: Jannifer Hick M.D.   On: 11/26/2021 14:19   US SCROTUM W/DOPPLER  Result Date: 11/26/2021 CLINICAL DATA:  Cellulitis. EXAM: SCROTAL ULTRASOUND DOPPLER ULTRASOUND OF THE TESTICLES TECHNIQUE: Complete ultrasound examination of the testicles, epididymis, and other scrotal structures was performed. Color and spectral Doppler ultrasound were also utilized to evaluate blood flow to the testicles. COMPARISON:  CT scan earlier same day FINDINGS: Right testicle Measurements: 3.8 x 2.0 x 2.6 cm. 6 mm calcification identified at or in the tunic albuginea along the superolateral aspect of the testicle. No intratesticular mass lesion. Left testicle Measurements: 4.6 x 1.8 x 2.5 cm. No mass or microlithiasis visualized. Right epididymis:  Normal in size and appearance. Left epididymis:  Normal in size and appearance. Hydrocele: There is some fluid in the right hemiscrotum, indeterminate given the large right inguinal hernia. Varicocele: Prominent venous anatomy measuring greater than 3 mm diameter noted in each hemiscrotum compatible with bilateral varicocele. Pulsed Doppler interrogation of both testes demonstrates normal low resistance arterial and venous waveforms bilaterally. IMPRESSION: 1. No evidence for testicular torsion. No evidence for epididymo-orchitis. 2. Large right inguinal hernia containing small bowel loops and colon better characterized on CT scan earlier today. Electronically Signed   By: Kennith Center M.D.   On: 11/26/2021 16:22         Norman Clay 11/26/21 2307    Gloris Manchester, MD 11/28/21 [phone]

## 2021-11-26 NOTE — Discharge Instructions (Addendum)
You were given a prescription for antibiotics. Please take the antibiotic prescription fully.   Please follow up with your primary care provider within 5-7 days for re-evaluation of your symptoms. If you do not have a primary care provider, information for a healthcare clinic has been provided for you to make arrangements for follow up care. Please return to the emergency department for any new or worsening symptoms.  You may have diarrhea from the antibiotics.  It is very important that you continue to take the antibiotics even if you get diarrhea unless a medical professional tells you that you may stop taking them.  If you stop too early the bacteria you are being treated for will become stronger and you may need different, more powerful antibiotics that have more side effects and worsening diarrhea.  Please stay well hydrated and consider probiotics as they may decrease the severity of your diarrhea.

## 2021-11-26 NOTE — ED Notes (Signed)
Patient transported to us 

## 2021-11-27 ENCOUNTER — Ambulatory Visit: Payer: Medicaid Other | Attending: Primary Care | Admitting: Pharmacist

## 2021-11-27 ENCOUNTER — Other Ambulatory Visit: Payer: Self-pay

## 2021-11-27 DIAGNOSIS — E118 Type 2 diabetes mellitus with unspecified complications: Secondary | ICD-10-CM

## 2021-11-27 DIAGNOSIS — Z794 Long term (current) use of insulin: Secondary | ICD-10-CM

## 2021-11-27 MED ORDER — OZEMPIC (0.25 OR 0.5 MG/DOSE) 2 MG/1.5ML ~~LOC~~ SOPN
0.2500 mg | PEN_INJECTOR | SUBCUTANEOUS | 2 refills | Status: DC
  Filled 2021-11-27: qty 1.5, 56d supply, fill #0

## 2021-11-28 ENCOUNTER — Other Ambulatory Visit: Payer: Self-pay

## 2021-11-30 ENCOUNTER — Other Ambulatory Visit (INDEPENDENT_AMBULATORY_CARE_PROVIDER_SITE_OTHER): Payer: Self-pay | Admitting: Primary Care

## 2021-11-30 DIAGNOSIS — E118 Type 2 diabetes mellitus with unspecified complications: Secondary | ICD-10-CM

## 2021-11-30 NOTE — Telephone Encounter (Signed)
Jardiance refilled 10/30/2021 #90 with 1 refill - 6 month supply. Novolog refilled 10/29/2021 15 mL 11 refills - 12 monht supply. Requested Prescriptions  Pending Prescriptions Disp Refills   empagliflozin (JARDIANCE) 25 MG TABS tablet 90 tablet 1    Sig: Take 1 tablet (25 mg total) by mouth daily before breakfast.     Endocrinology:  Diabetes - SGLT2 Inhibitors Failed - 11/30/2021  5:17 PM      Failed - Cr in normal range and within 360 days    Creatinine, Ser  Date Value Ref Range Status  11/26/2021 1.30 (H) 0.61 - 1.24 mg/dL Final         Failed - HBA1C is between 0 and 7.9 and within 180 days    Hgb A1c MFr Bld  Date Value Ref Range Status  10/02/2021 12.5 (H) 4.8 - 5.6 % Final    Comment:    (NOTE) Pre diabetes:          5.7%-6.4%  Diabetes:              >6.4%  Glycemic control for   <7.0% adults with diabetes          Passed - eGFR in normal range and within 360 days    GFR, Estimated  Date Value Ref Range Status  11/26/2021 >60 >60 mL/min Final    Comment:    (NOTE) Calculated using the CKD-EPI Creatinine Equation (2021)    eGFR  Date Value Ref Range Status  11/22/2021 65 >59 mL/min/1.73 Final         Passed - Valid encounter within last 6 months    Recent Outpatient Visits          3 days ago Type 2 diabetes mellitus with complication, with long-term current use of insulin ([place])   [place], Jarome Matin, RPH-CPP   1 month ago Encounter to establish care   [place], [place] P, NP   6 years ago Essential hypertension   Primary Care at [facility], Renette Butters, MD   6 years ago Epistaxis   Primary Care at [facility], Renette Butters, MD   7 years ago Left sided abdominal pain   Primary Care at [facility], Benn Moulder, MD      Future Appointments            In 1 month Oletta Lamas [place] Cage, NP [place]   In 2 months Patwardhan, Reynold Bowen, MD [facility]   In 2 months Vickie Epley, MD [facility], [facility]            insulin aspart protamine - aspart (NOVOLOG MIX 70/30 FLEXPEN) (70-30) 100 UNIT/ML FlexPen 15 mL 11    Sig: Inject 22 Units into the skin 2 (two) times daily.     Endocrinology:  Diabetes - Insulins Failed - 11/30/2021  5:17 PM      Failed - HBA1C is between 0 and 7.9 and within 180 days    Hgb A1c MFr Bld  Date Value Ref Range Status  10/02/2021 12.5 (H) 4.8 - 5.6 % Final    Comment:    (NOTE) Pre diabetes:          5.7%-6.4%  Diabetes:              >6.4%  Glycemic control for   <7.0% adults with diabetes  Passed - Valid encounter within last 6 months    Recent Outpatient Visits          3 days ago Type 2 diabetes mellitus with complication, with long-term current use of insulin [place])   [place], RPH-CPP   1 month ago Encounter to establish care   [place], [place] P, NP   6 years ago Essential hypertension   Primary Care at [facility], Renette Butters, MD   6 years ago Epistaxis   Primary Care at [facility], Renette Butters, MD   7 years ago Left sided abdominal pain   Primary Care at [facility], Benn Moulder, MD      Future Appointments            In 1 month Oletta Lamas Milford Cage, NP [place]   In 2 months Patwardhan, Reynold Bowen, MD [facility]   In 2 months Vickie Epley, MD [facility], [facility]

## 2021-11-30 NOTE — Telephone Encounter (Signed)
Medication Refill - Medication: empagliflozin (JARDIANCE) 25 MG TABS tablet / insulin aspart protamine - aspart (NOVOLOG MIX 70/30 FLEXPEN) (70-30) 100 UNIT/ML FlexPen - Pt requesting medication NOVOLOG refills to be transferred to community health as it will be cheaper for him.  Has the patient contacted their pharmacy? Yes.   Was advised to contact PCP  (Agent: If yes, when and what did the pharmacy advise?)  Preferred Pharmacy (with phone number or street name):   [facility]  [address] [place] [address]  Phone: [phone] Fax: [phone]  Hours: M-F 7:30a-6:00p   Has the patient been seen for an appointment in the last year OR does the patient have an upcoming appointment? Yes.    Agent: Please be advised that RX refills may take up to 3 business days. We ask that you follow-up with your pharmacy.

## 2021-12-01 ENCOUNTER — Other Ambulatory Visit: Payer: Self-pay

## 2021-12-04 ENCOUNTER — Other Ambulatory Visit: Payer: Self-pay

## 2021-12-15 ENCOUNTER — Other Ambulatory Visit: Payer: Self-pay

## 2021-12-22 ENCOUNTER — Other Ambulatory Visit: Payer: Self-pay

## 2021-12-22 ENCOUNTER — Ambulatory Visit: Payer: Medicaid Other

## 2021-12-22 DIAGNOSIS — I401 Isolated myocarditis: Secondary | ICD-10-CM

## 2021-12-22 DIAGNOSIS — I5020 Unspecified systolic (congestive) heart failure: Secondary | ICD-10-CM

## 2021-12-24 NOTE — Progress Notes (Signed)
EF has normalized. I am taking him off LifeVest. ? ?Daniel [name]

## 2021-12-26 NOTE — Progress Notes (Signed)
Called pt to inform him about the meassage above. Pt understood and mention that he will go back to work on 04/03.

## 2022-01-01 ENCOUNTER — Other Ambulatory Visit: Payer: Self-pay

## 2022-01-01 ENCOUNTER — Encounter (INDEPENDENT_AMBULATORY_CARE_PROVIDER_SITE_OTHER): Payer: Self-pay | Admitting: Primary Care

## 2022-01-01 ENCOUNTER — Ambulatory Visit (INDEPENDENT_AMBULATORY_CARE_PROVIDER_SITE_OTHER): Payer: Medicaid Other | Admitting: Primary Care

## 2022-01-01 VITALS — BP 126/77 | HR 59 | Temp 98.0°F | Wt 245.0 lb

## 2022-01-01 DIAGNOSIS — E118 Type 2 diabetes mellitus with unspecified complications: Secondary | ICD-10-CM | POA: Diagnosis not present

## 2022-01-01 DIAGNOSIS — E119 Type 2 diabetes mellitus without complications: Secondary | ICD-10-CM

## 2022-01-01 DIAGNOSIS — Z794 Long term (current) use of insulin: Secondary | ICD-10-CM

## 2022-01-01 LAB — POCT GLYCOSYLATED HEMOGLOBIN (HGB A1C): Hemoglobin A1C: 7.6 % — AB (ref 4.0–5.6)

## 2022-01-01 NOTE — Progress Notes (Signed)
?[facility] ? ? ?Daniel [name] is a 66 y.o. male presents for hypertension evaluation, Denies shortness of breath, headaches, chest pain or lower extremity edema, sudden onset, vision changes, unilateral weakness, dizziness, paresthesias  ?Compliant with meds - Yes ?Checking CBGs? Yes ? Fasting avg - 146-400 ? Postprandial average -  ?Neuropathy ? - No ?Hypoglycemic events - No ? - Recovers with :  ?Patient reports adherence with medications.  ? ?Dietary habits include: monitoring sweets ?Exercise habits include:No ?Family / Social history: Father Heart disease  ? ? ?Past Medical History:  ?Diagnosis Date  ? Asthma   ? Diabetes mellitus without complication (HCC)   ? Hypertension   ? ?Past Surgical History:  ?Procedure Laterality Date  ? LEFT HEART CATH AND CORONARY ANGIOGRAPHY N/A 10/02/2021  ? Procedure: LEFT HEART CATH AND CORONARY ANGIOGRAPHY;  Surgeon: Elder Negus, MD;  Location: [facility];  Service: Cardiovascular;  Laterality: N/A;  ? ?Allergies  ?Allergen Reactions  ? Metformin And Related Nausea Only  ? ?Current Outpatient Medications on File Prior to Visit  ?Medication Sig Dispense Refill  ? amiodarone (PACERONE) 200 MG tablet Take 1 tablet (200 mg total) by mouth daily. 90 tablet 3  ? aspirin 81 MG EC tablet Take 1 tablet (81 mg total) by mouth daily. 90 tablet 3  ? empagliflozin (JARDIANCE) 25 MG TABS tablet Take 1 tablet (25 mg total) by mouth daily before breakfast. 90 tablet 1  ? insulin aspart protamine - aspart (NOVOLOG MIX 70/30 FLEXPEN) (70-30) 100 UNIT/ML FlexPen Inject 22 Units into the skin 2 (two) times daily. 15 mL 11  ? Insulin Pen Needle 32G X 4 MM MISC Use to inject insulin twice daily. 100 each 2  ? metoprolol succinate (TOPROL-XL) 25 MG 24 hr tablet Take 1 tablet (25 mg total) by mouth daily. 90 tablet 3  ? mexiletine (MEXITIL) 150 MG capsule Take 1 capsule (150 mg total) by mouth every 8 (eight) hours. 270 capsule 3  ? Multiple Vitamin (MULTIVITAMIN  WITH MINERALS) TABS tablet Take 1 tablet by mouth daily.    ? rosuvastatin (CRESTOR) 20 MG tablet Take 1 tablet (20 mg total) by mouth daily. 90 tablet 3  ? sacubitril-valsartan (ENTRESTO) 24-26 MG Take 1 tablet by mouth 2 (two) times daily. 180 tablet 3  ? Semaglutide,0.25 or 0.5MG /DOS, (OZEMPIC, 0.25 OR 0.5 MG/DOSE,) 2 MG/1.5ML SOPN Inject 0.25 mg into the skin once a week. 1.5 mL 2  ? simethicone (MYLICON) 125 MG chewable tablet Chew 125 mg by mouth every 6 (six) hours as needed for flatulence.    ? torsemide (DEMADEX) 20 MG tablet Take 1 tablet (20 mg total) by mouth 2 (two) times daily. 180 tablet 3  ? ?No current facility-administered medications on file prior to visit.  ? ?Social History  ? ?Socioeconomic History  ? Marital status: Single  ?  Spouse name: Not on file  ? Number of children: 0  ? Years of education: Not on file  ? Highest education level: Not on file  ?Occupational History  ? Not on file  ?Tobacco Use  ? Smoking status: Never  ?  Passive exposure: Never  ? Smokeless tobacco: Never  ?Vaping Use  ? Vaping Use: Never used  ?Substance and Sexual Activity  ? Alcohol use: No  ? Drug use: No  ? Sexual activity: Never  ?Other Topics Concern  ? Not on file  ?Social History Narrative  ? Marital status: single; not dating  ?  Children: none  ?    Lives: alone  ?    Employment:  Maintenance/production  ?    Tobacco:  None;   ?    Alcohol: none  ?    Drugs: none  ?     ? ?Social Determinants of Health  ? ?Financial Resource Strain: Not on file  ?Food Insecurity: Not on file  ?Transportation Needs: Not on file  ?Physical Activity: Not on file  ?Stress: Not on file  ?Social Connections: Not on file  ?Intimate Partner Violence: Not on file  ? ?Family History  ?Problem Relation Age of Onset  ? Cancer Mother   ?     unknown primary  ? Heart attack Father   ? Heart disease Father 44  ?     AMI  ? Cancer Paternal Aunt   ? ? ? ?OBJECTIVE: ? ?Vitals:  ? 01/01/22 1334  ?BP: 126/77  ?Pulse: (!) 59  ?Temp: 98 ?F  (36.7 ?C)  ?TempSrc: Oral  ?SpO2: 95%  ?Weight: 245 lb (111.1 kg)  ? ? ?Physical Exam ?Vitals reviewed.  ?Constitutional:   ?   Appearance: He is obese.  ?HENT:  ?   Head: Normocephalic.  ?   Right Ear: Tympanic membrane and external ear normal.  ?   Left Ear: Tympanic membrane and external ear normal.  ?   Nose: Nose normal.  ?Eyes:  ?   Extraocular Movements: Extraocular movements intact.  ?   Pupils: Pupils are equal, round, and reactive to light.  ?Cardiovascular:  ?   Rate and Rhythm: Normal rate and regular rhythm.  ?Pulmonary:  ?   Effort: Pulmonary effort is normal.  ?   Breath sounds: Normal breath sounds.  ?Abdominal:  ?   General: Bowel sounds are normal. There is distension.  ?   Palpations: Abdomen is soft.  ?Musculoskeletal:     ?   General: Normal range of motion.  ?   Cervical back: Normal range of motion.  ?Skin: ?   General: Skin is warm and dry.  ?Neurological:  ?   Mental Status: He is alert and oriented to person, place, and time.  ?Psychiatric:     ?   Mood and Affect: Mood normal.     ?   Behavior: Behavior normal.     ?   Thought Content: Thought content normal.     ?   Judgment: Judgment normal.  ? ? ?ROS ?Comprehensive ROS Pertinent positive and negative noted in HPI   ?Last 3 Office BP readings: ?BP Readings from Last 3 Encounters:  ?01/01/22 126/77  ?11/26/21 123/65  ?11/23/21 134/77  ? ? ?BMET ?   ?Component Value Date/Time  ? NA 138 11/26/2021 1210  ? NA 140 11/22/2021 1228  ? K 3.9 11/26/2021 1210  ? CL 99 11/26/2021 1210  ? CO2 27 11/26/2021 1210  ? GLUCOSE 139 (H) 11/26/2021 1210  ? BUN 11 11/26/2021 1210  ? BUN 16 11/22/2021 1228  ? CREATININE 1.30 (H) 11/26/2021 1210  ? CALCIUM 8.9 11/26/2021 1210  ? GFRNONAA >60 11/26/2021 1210  ? ? ?Renal function: ?CrCl cannot be calculated (Patient's most recent lab result is older than the maximum 21 days allowed.). ? ?Clinical ASCVD: Yes  ?The 10-year ASCVD risk score (Arnett DK, et al., 2019) is: 21.4% ?  Values used to calculate the  score: ?    Age: 66 years ?    Sex: Male ?    Is Non-Hispanic African American: No ?  Diabetic: Yes ?    Tobacco smoker: No ?    Systolic Blood Pressure: 123XX123 mmHg ?    Is BP treated: Yes ?    HDL Cholesterol: 48 mg/dL ?    Total Cholesterol: 180 mg/dL ? ?ASCVD risk factors include- [place] ? ? ?ASSESSMENT & PLAN: ? ?1. Type 2 diabetes mellitus with complication, with long-term current use of insulin ([place])   ?Goal of therapy: Less than 6.5 hemoglobin A1c.  Vast improvement from 12.5 to 7. 6 . Continue to monitor foods that are high in carbohydrates are the following rice, potatoes, breads, sugars, and pastas.  Reduction in the intake (eating) will assist in lowering your blood sugars. No changes in medications at this time ? ?This note has been created with Surveyor, quantity. Any transcriptional errors are unintentional.  ? ?Kerin Perna, NP ?01/01/2022, 1:41 PM ?  ?

## 2022-01-02 ENCOUNTER — Other Ambulatory Visit: Payer: Self-pay

## 2022-01-12 ENCOUNTER — Other Ambulatory Visit: Payer: Self-pay

## 2022-01-18 ENCOUNTER — Ambulatory Visit: Payer: Self-pay | Admitting: Pharmacist

## 2022-01-22 ENCOUNTER — Other Ambulatory Visit: Payer: Self-pay

## 2022-01-23 ENCOUNTER — Other Ambulatory Visit: Payer: Self-pay

## 2022-01-25 ENCOUNTER — Telehealth (HOSPITAL_COMMUNITY): Payer: Self-pay | Admitting: Emergency Medicine

## 2022-01-25 NOTE — Telephone Encounter (Signed)
Reaching out to patient to offer assistance regarding upcoming cardiac imaging study; pt verbalizes understanding of appt date/time, parking situation and where to check in, and verified current allergies; name and call back number provided for further questions should they arise ?Marchia Bond RN Navigator Cardiac Imaging ?[facility] ?[phone] office ?[phone] cell ? ?Denies iv issues ?Denies implants ?Denies claustro ?Arrival [phone] ?Holding torsemide ? ?

## 2022-01-26 ENCOUNTER — Ambulatory Visit (HOSPITAL_COMMUNITY)
Admission: RE | Admit: 2022-01-26 | Discharge: 2022-01-26 | Disposition: A | Discharge status: Home / Self Care | Payer: Medicaid Other | Source: Ambulatory Visit | Attending: Student | Admitting: Student

## 2022-01-26 DIAGNOSIS — I472 Ventricular tachycardia, unspecified: Secondary | ICD-10-CM | POA: Insufficient documentation

## 2022-01-26 IMAGING — MR MR CARD MORPHOLOGY WO/W CM
45 of 48 series · 45 of 48 positions shown · IV contrast (gadavist)
Comparison: [DATE]

CLINICAL DATA: VT/Myocarditis

EXAM:
CARDIAC MRI
TECHNIQUE: The patient was scanned on a 1.5 Tesla Siemens magnet. A dedicated
cardiac coil was used. Functional imaging was done using Fiesta
sequences. [DATE], and 4 chamber views were done to assess for RWMA's.
Modified EDELIZA rule using a short axis stack was used to
calculate an ejection fraction on a dedicated work station using
Circle software. The patient received 10 cc of Gadavist. After 10
minutes inversion recovery sequences were used to assess for
infiltration and scar tissue.
CONTRAST:  Gadavist

[Series 4: [id number]_bh · axial · 8.0mm · 1.41mm/px · 1 of 18 slices shown]
[im 1/18]
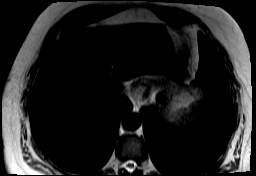

[Series 8: bSSFP · oblique · 8.0mm · 1.61mm/px · 1 of 25 slices shown (1 of 19)]
[im 1/25]
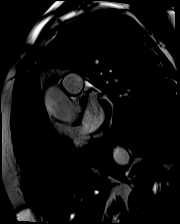

[Series 9: bSSFP · oblique · 8.0mm · 1.61mm/px · 1 of 25 slices shown (2 of 19)]
[im 1/25]
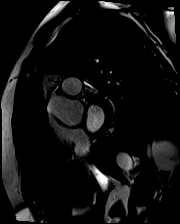

[Series 10: bSSFP · oblique · 8.0mm · 1.61mm/px · 1 of 25 slices shown (3 of 19)]
[im 1/25]
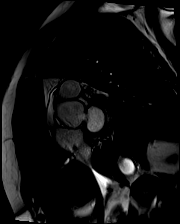

[Series 11: bSSFP · oblique · 8.0mm · 1.61mm/px · 1 of 25 slices shown (4 of 19)]
[im 1/25]
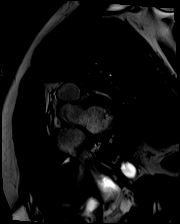

[Series 12: bSSFP · oblique · 8.0mm · 1.61mm/px · 1 of 25 slices shown (5 of 19)]
[im 1/25]
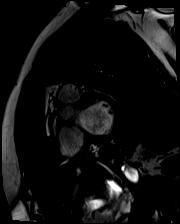

[Series 13: bSSFP · oblique · 8.0mm · 1.61mm/px · 1 of 25 slices shown (6 of 19)]
[im 1/25]
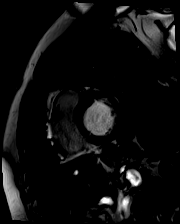

[Series 14: bSSFP · oblique · 8.0mm · 1.61mm/px · 1 of 25 slices shown (7 of 19)]
[im 1/25]
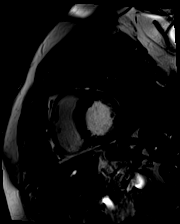

[Series 15: bSSFP · oblique · 8.0mm · 1.61mm/px · 1 of 25 slices shown (8 of 19)]
[im 1/25]
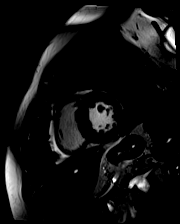

[Series 16: bSSFP · oblique · 8.0mm · 1.61mm/px · 1 of 25 slices shown (9 of 19)]
[im 1/25]
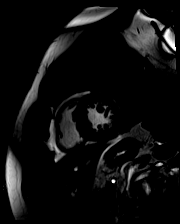

[Series 17: bSSFP · oblique · 8.0mm · 1.61mm/px · 1 of 25 slices shown (10 of 19)]
[im 1/25]
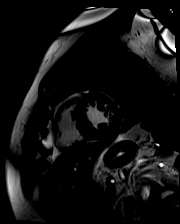

[Series 18: bSSFP · oblique · 8.0mm · 1.61mm/px · 1 of 25 slices shown (11 of 19)]
[im 1/25]
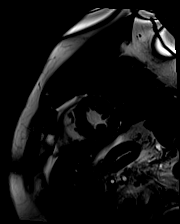

[Series 19: bSSFP · oblique · 8.0mm · 1.61mm/px · 1 of 25 slices shown (12 of 19)]
[im 1/25]
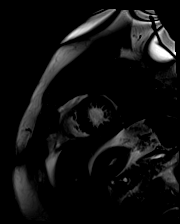

[Series 20: bSSFP · oblique · 8.0mm · 1.61mm/px · 1 of 25 slices shown (13 of 19)]
[im 1/25]
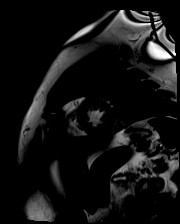

[Series 21: bSSFP · oblique · 8.0mm · 1.61mm/px · 1 of 25 slices shown (14 of 19)]
[im 1/25]
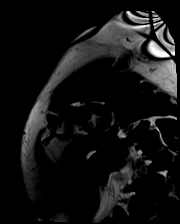

[Series 22: bSSFP · oblique · 8.0mm · 1.61mm/px · 1 of 25 slices shown (15 of 19)]
[im 1/25]
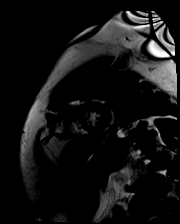

[Series 23: bSSFP · oblique · 8.0mm · 1.61mm/px · 1 of 25 slices shown (16 of 19)]
[im 1/25]
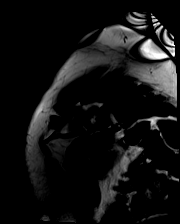

[Series 24: bSSFP · oblique · 8.0mm · 1.61mm/px · 1 of 25 slices shown (17 of 19)]
[im 1/25]
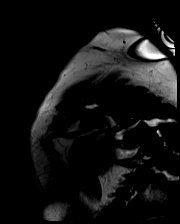

[Series 25: bSSFP · oblique · 8.0mm · 1.61mm/px · 1 of 25 slices shown (18 of 19)]
[im 1/25]
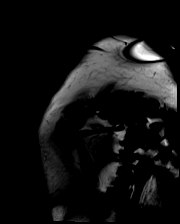

[Series 26: bSSFP · oblique · 8.0mm · 1.61mm/px · 1 of 25 slices shown (19 of 19)]
[im 1/25]
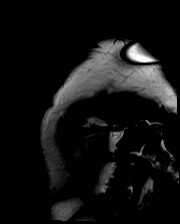

[Series 27: STIR · oblique · 8.0mm · 1.92mm/px · 1 of 16 slices shown]
[im 1/16]
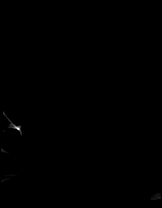

[Series 28: cine_trufi_short axis_cs_2_shot · oblique · 8.0mm · 1.48mm/px · 1 of 25 slices shown (1 of 24)]
[im 1/25]
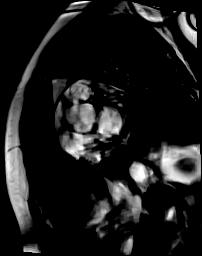

[Series 28: cine_trufi_short axis_cs_2_shot · oblique · 8.0mm · 1.48mm/px · 1 of 25 slices shown (2 of 24)]
[im 1/25]
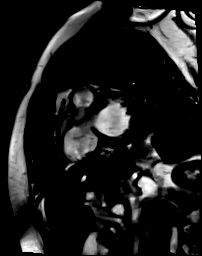

[Series 28: cine_trufi_short axis_cs_2_shot · oblique · 8.0mm · 1.48mm/px · 1 of 25 slices shown (3 of 24)]
[im 1/25]
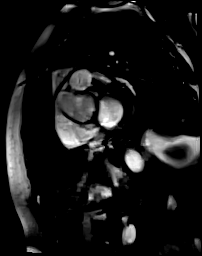

[Series 28: cine_trufi_short axis_cs_2_shot · oblique · 8.0mm · 1.48mm/px · 1 of 25 slices shown (4 of 24)]
[im 1/25]
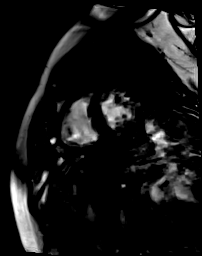

[Series 28: cine_trufi_short axis_cs_2_shot · oblique · 8.0mm · 1.48mm/px · 1 of 25 slices shown (5 of 24)]
[im 1/25]
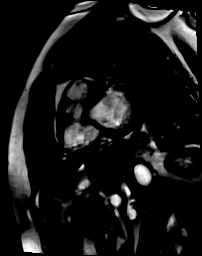

[Series 28: cine_trufi_short axis_cs_2_shot · oblique · 8.0mm · 1.48mm/px · 1 of 25 slices shown (6 of 24)]
[im 1/25]
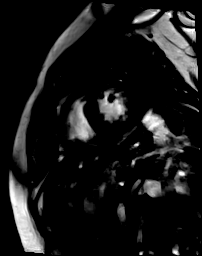

[Series 28: cine_trufi_short axis_cs_2_shot · oblique · 8.0mm · 1.48mm/px · 1 of 25 slices shown (7 of 24)]
[im 1/25]
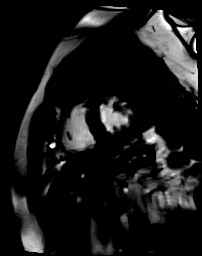

[Series 28: cine_trufi_short axis_cs_2_shot · oblique · 8.0mm · 1.48mm/px · 1 of 25 slices shown (8 of 24)]
[im 1/25]
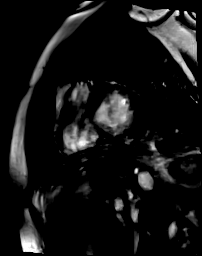

[Series 28: cine_trufi_short axis_cs_2_shot · oblique · 8.0mm · 1.48mm/px · 1 of 25 slices shown (9 of 24)]
[im 1/25]
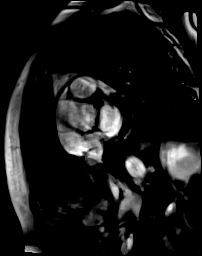

[Series 29: cine_trufi_short axis_cs_2_shot · oblique · 8.0mm · 1.52mm/px · 1 of 25 slices shown (10 of 24)]
[im 1/25]
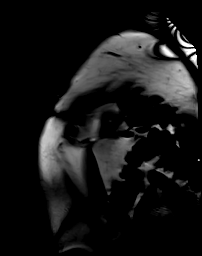

[Series 29: cine_trufi_short axis_cs_2_shot · oblique · 8.0mm · 1.52mm/px · 1 of 25 slices shown (11 of 24)]
[im 1/25]
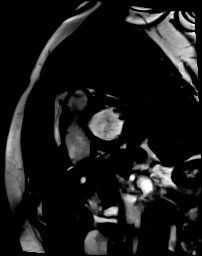

[Series 29: cine_trufi_short axis_cs_2_shot · oblique · 8.0mm · 1.52mm/px · 1 of 25 slices shown (12 of 24)]
[im 1/25]
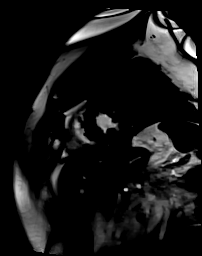

[Series 29: cine_trufi_short axis_cs_2_shot · oblique · 8.0mm · 1.52mm/px · 1 of 25 slices shown (13 of 24)]
[im 1/25]
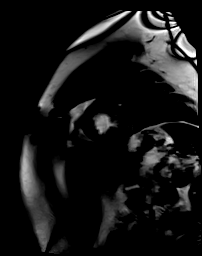

[Series 29: cine_trufi_short axis_cs_2_shot · oblique · 8.0mm · 1.52mm/px · 1 of 25 slices shown (14 of 24)]
[im 1/25]
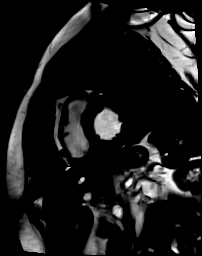

[Series 29: cine_trufi_short axis_cs_2_shot · oblique · 8.0mm · 1.52mm/px · 1 of 25 slices shown (15 of 24)]
[im 1/25]
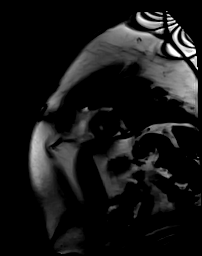

[Series 29: cine_trufi_short axis_cs_2_shot · oblique · 8.0mm · 1.52mm/px · 1 of 25 slices shown (16 of 24)]
[im 1/25]
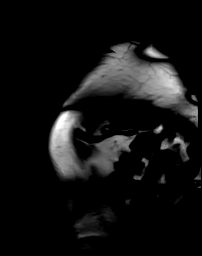

[Series 29: cine_trufi_short axis_cs_2_shot · oblique · 8.0mm · 1.52mm/px · 1 of 25 slices shown (17 of 24)]
[im 1/25]
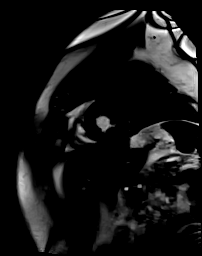

[Series 29: cine_trufi_short axis_cs_2_shot · oblique · 8.0mm · 1.52mm/px · 1 of 25 slices shown (18 of 24)]
[im 1/25]
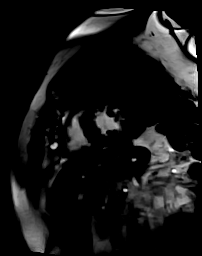

[Series 29: cine_trufi_short axis_cs_2_shot · oblique · 8.0mm · 1.52mm/px · 1 of 25 slices shown (19 of 24)]
[im 1/25]
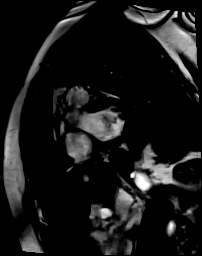

[Series 29: cine_trufi_short axis_cs_2_shot · oblique · 8.0mm · 1.52mm/px · 1 of 25 slices shown (20 of 24)]
[im 1/25]
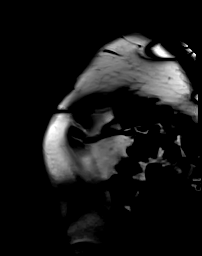

[Series 29: cine_trufi_short axis_cs_2_shot · oblique · 8.0mm · 1.52mm/px · 1 of 25 slices shown (21 of 24)]
[im 1/25]
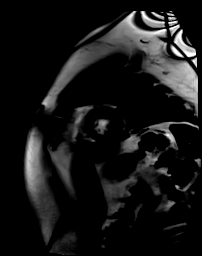

[Series 29: cine_trufi_short axis_cs_2_shot · oblique · 8.0mm · 1.52mm/px · 1 of 25 slices shown (22 of 24)]
[im 1/25]
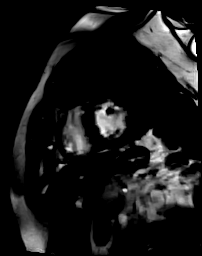

[Series 29: cine_trufi_short axis_cs_2_shot · oblique · 8.0mm · 1.52mm/px · 1 of 25 slices shown (23 of 24)]
[im 1/25]
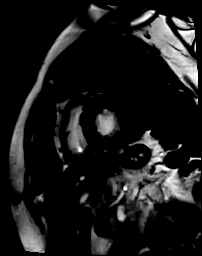

[Series 29: cine_trufi_short axis_cs_2_shot · oblique · 8.0mm · 1.52mm/px · 1 of 25 slices shown (24 of 24)]
[im 1/25]
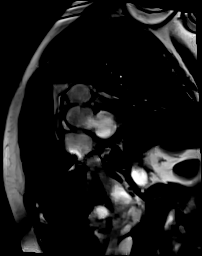

[45 of 48 positions shown; findings below may reference images not displayed]

FINDINGS: Mild LAE. Normal RA/RV. Trivial pericardial effusion lateral and
posterior to LV . Dilated ascending thoracic aorta 4.1 cm Normal AV
tri leaflet. Normal MV with trivial appearing MR. Normal TV/PV

Mild LVE with global hypokinesis and abnormal septal motion.
Quantitative EF 42% (EDV 163 cc ESV 95 cc SV 68 cc) Post gadolinium
images show mid myocardial uptake in mid/apical inferior wall and

Basal / mid anterior septum. This is not in a coronary distribution.

ECV:  37%

T1 [BQ] msec

T2 50 msec
IMPRESSION: 1. Mild LVE with global hypokinesis and abnormal septal motion EF
42% this is improved from MRI [DATE] when it was 36%

2. Mid myocardial gadolinium uptake in antero-septum and inferior
wall The degree of overall gadolinium uptake is less than seen on
prior scan [DATE]

[DATE]. Normal T2 with mildly elevated ECV/T1 suggest more chronic phase
to myocarditis

4.  Dilated ascending thoracic aorta 4.1 cm

EDELIZA

## 2022-01-26 MED ORDER — GADOBUTROL 1 MMOL/ML IV SOLN
10.0000 mL | Freq: Once | INTRAVENOUS | Status: AC | PRN
  Administered 2022-01-26: 10 mL via INTRAVENOUS

## 2022-02-06 ENCOUNTER — Ambulatory Visit: Payer: Medicaid Other | Attending: Primary Care | Admitting: Pharmacist

## 2022-02-06 ENCOUNTER — Other Ambulatory Visit: Payer: Self-pay

## 2022-02-06 DIAGNOSIS — E118 Type 2 diabetes mellitus with unspecified complications: Secondary | ICD-10-CM | POA: Diagnosis not present

## 2022-02-06 DIAGNOSIS — Z794 Long term (current) use of insulin: Secondary | ICD-10-CM | POA: Diagnosis not present

## 2022-02-06 NOTE — Progress Notes (Signed)
? ? ?S:    ?Daniel [name] is a 66 y.o. male who presents for diabetes evaluation, education, and management. PMH is significant for T2DM, HTN, HFrEF, CAD. Patient was initially referred by Primary Care Provider, Gwinda Passe, NP, on 10/30/21. We saw him on 11/27/2021 and started Ozempic. He saw Marcelino Duster in the interim (01/01/2022). A1c revealed drastic improvement at 7.6% (down from 12.5%). ? ?Today, patient arrives in good spirits and presents without assistance. He reveals to use today that he never started the Ozempic. He tells me this is because he was unsure if he could take this with insulin. He is compliant with his BID 70/30 and his Jardiance.  ? ?Family/Social History:  ?-MI in father ?-Never smoker ?-No alcohol use reported ? ?Current diabetes medications include: Jardiance 25 mg daily, Novolog 70/30 22 units BID, semaglutide 0.25 mg weekly (not taking) ?Current hyperlipidemia medications include: rosuvastatin 20 mg daily ? ?Reports adherence to medications with the exception of Ozempic.  ? ?Patient denies hypoglycemic events. ? ?Checks 1pm and 1am (eats 9am and 9pm) ?Reported home blood sugar range: reports "improvement". Denies any readings <70. Tells me that most of his sugars are <200 since his last visit with me.  ? ?Patient denies nocturia (nighttime urination).  ?Patient reports neuropathy (nerve pain). Tingling in feet.  ?Patient denies visual changes. ?Patient reports self foot exams.  ? ?Patient reported dietary habits: Eats 2 meals/day (9am and 9pm). Eats Healthy Choice meals.  ?- Cut out ginger ale, soft drinks since last visit with pharmacy ?- Cut out snack foods and sweet foods since last visit with pharmacy  ? ?O:  ? ?Lab Results  ?Component Value Date  ? HGBA1C 7.6 (A) 01/01/2022  ? ?There were no vitals filed for this visit. ? ?Lipid Panel  ?   ?Component Value Date/Time  ? CHOL 180 10/02/2021 1327  ? TRIG 189 (H) 10/02/2021 1327  ? HDL 48 10/02/2021 1327  ? CHOLHDL 3.8 10/02/2021 1327  ?  VLDL 38 10/02/2021 1327  ? LDLCALC 94 10/02/2021 1327  ? ?Clinical Atherosclerotic Cardiovascular Disease (ASCVD): Yes  ?The 10-year ASCVD risk score (Arnett DK, et al., 2019) is: 23% ?  Values used to calculate the score: ?    Age: 89 years ?    Sex: Male ?    Is Non-Hispanic African American: No ?    Diabetic: Yes ?    Tobacco smoker: No ?    Systolic Blood Pressure: 126 mmHg ?    Is BP treated: Yes ?    HDL Cholesterol: 48 mg/dL ?    Total Cholesterol: 180 mg/dL  ? ?A/P: ?Diabetes currently uncontrolled but vastly improved. Most recent A1c is near goal at 7.6%. Commended pt for this. Patient is able to verbalize appropriate hypoglycemia management plan. Medication adherence appears with insulin and Jardiance. Given that he is not taking Ozempic, I suspect his improved control is due mostly to his dietary modification between this visit and his last with pharmacy. I would like for him to begin Ozempic. Hopefully, this will decrease his exogenous insulin requirement and promote further weight loss.  ?-Start Ozempic 0.25 mg once weekly. Encouraged pt to take once weekly. Reassured him that he can take this with his "AM" dose of 70/30. He plans to start this. ?-Continue Jardiance 25 mg daily and Novolog 70/30 22 units BID.  ?-Anticipate need to decrease Novolog 70/30 dose as we titrate Ozempic.  ?-Patient educated on purpose, proper use, and potential adverse effects of Ozempic.  ?-  Counseled him to take his insulin BEFORE meals and to start checking BG before meals (fasting) and 2 hours after eating.  ?-Extensively discussed pathophysiology of diabetes, recommended lifestyle interventions, dietary effects on blood sugar control.  ?-Counseled on s/sx of and management of hypoglycemia.  ?-Next A1c anticipated June 2023.  ? ?Written patient instructions provided. Patient verbalized understanding of treatment plan. Total time in face to face counseling 30 minutes.   ? ?Follow up with me in 1 month.  ? ?Butch Penny,  PharmD, BCACP, CPP ?Clinical Pharmacist ?[facility] ?[phone] ? ? ?

## 2022-02-12 ENCOUNTER — Other Ambulatory Visit: Payer: Self-pay

## 2022-02-14 ENCOUNTER — Ambulatory Visit: Payer: Medicaid Other | Admitting: Cardiology

## 2022-02-14 ENCOUNTER — Ambulatory Visit: Payer: Medicaid Other | Admitting: Student

## 2022-02-14 ENCOUNTER — Encounter: Payer: Self-pay | Admitting: Student

## 2022-02-14 VITALS — BP 125/76 | HR 57 | Temp 97.9°F | Resp 17 | Ht 72.3 in | Wt 239.6 lb

## 2022-02-14 DIAGNOSIS — I5020 Unspecified systolic (congestive) heart failure: Secondary | ICD-10-CM

## 2022-02-14 DIAGNOSIS — I25118 Atherosclerotic heart disease of native coronary artery with other forms of angina pectoris: Secondary | ICD-10-CM

## 2022-02-14 DIAGNOSIS — I472 Ventricular tachycardia, unspecified: Secondary | ICD-10-CM

## 2022-02-14 DIAGNOSIS — I401 Isolated myocarditis: Secondary | ICD-10-CM

## 2022-02-14 NOTE — Progress Notes (Signed)
? ?Follow up visit ? ?Subjective:  ? ?Daniel Daniel [name], Daniel [name]    DOB: 1955/11/25, 66 y.o.   MRN: [id number] ? ? ? ? ?HPI ? ?Chief Complaint  ?Patient presents with  ? Follow-up  ? Results  ? ? ?66 y.o.  caucasian Daniel [name]  with hypertension, asthma, uncontrolled type 2 diabetes mellitus, nonobstructive CAD, acute myocarditis complicated by unstable VT in 09/2021.  ? ?Patient was treated with medical therapy including Entresto, metoprolol, mexiletine, and amiodarone.  Repeat echocardiogram and cardiac MRI showed improvement of LVEF and LifeVest was discontinued.  Patient states he is feeling well without specific complaints today.  Dyspnea has continued to improve.  Denies chest pain, palpitations, syncope, near syncope.  Notably due to cost restraints patient has been without Entresto or Jardiance for the last 1 to 2 weeks. ? ?He has upcoming appointment with EP at the end of this month.  Reports Medicaid has not been approved. ? ?Current Outpatient Medications on File Prior to Visit  ?Medication Sig Dispense Refill  ? amiodarone (PACERONE) 200 MG tablet Take 1 tablet (200 mg total) by mouth daily. 90 tablet 3  ? aspirin 81 MG EC tablet Take 1 tablet (81 mg total) by mouth daily. 90 tablet 3  ? insulin aspart protamine - aspart (NOVOLOG MIX 70/30 FLEXPEN) (70-30) 100 UNIT/ML FlexPen Inject 22 Units into the skin 2 (two) times daily. 15 mL 11  ? Insulin Pen Needle 32G X 4 MM MISC Use to inject insulin twice daily. 100 each 2  ? metoprolol succinate (TOPROL-XL) 25 MG 24 hr tablet Take 1 tablet (25 mg total) by mouth daily. 90 tablet 3  ? mexiletine (MEXITIL) 150 MG capsule Take 1 capsule (150 mg total) by mouth every 8 (eight) hours. 270 capsule 3  ? Multiple Vitamin (MULTIVITAMIN WITH MINERALS) TABS tablet Take 1 tablet by mouth daily.    ? rosuvastatin (CRESTOR) 20 MG tablet Take 1 tablet (20 mg total) by mouth daily. 90 tablet 3  ? Semaglutide,0.25 or 0.5MG/DOS, (OZEMPIC, 0.25 OR 0.5 MG/DOSE,) 2 MG/1.5ML SOPN Inject 0.25 mg  into the skin once a week. 1.5 mL 2  ? simethicone (MYLICON) 932 MG chewable tablet Chew 125 mg by mouth every 6 (six) hours as needed for flatulence.    ? torsemide (DEMADEX) 20 MG tablet Take 1 tablet (20 mg total) by mouth 2 (two) times daily. 180 tablet 3  ? empagliflozin (JARDIANCE) 25 MG TABS tablet Take 1 tablet (25 mg total) by mouth daily before breakfast. (Patient not taking: Reported on 02/14/2022) 90 tablet 1  ? sacubitril-valsartan (ENTRESTO) 24-26 MG Take 1 tablet by mouth 2 (two) times daily. (Patient not taking: Reported on 02/14/2022) 180 tablet 3  ? ?No current facility-administered medications on file prior to visit.  ? ? ?Cardiovascular & other pertient studies: ? ?Cardiac MRI 01/26/2022: ?1. Mild LVE with global hypokinesis and abnormal septal motion EF 42% this is improved from MRI 10/03/21 when it was 36% ?2. Mid myocardial gadolinium uptake in antero-septum and inferior wall The degree of overall gadolinium uptake is less than seen on prior scan 10/03/21 ?3. Normal T2 with mildly elevated ECV/T1 suggest more chronic phase to myocarditis ?4.  Dilated ascending thoracic aorta 4.1 cm ? ?PCV ECHOCARDIOGRAM COMPLETE 12/22/2021 ?Left ventricle cavity is normal in size. Mild concentric hypertrophy of the left ventricle. Normal LV systolic function with EF 54%. Normal global wall motion. Doppler evidence of grade I (impaired) diastolic dysfunction, normal LAP. ?The aortic root is dilated at 4.1 cm.  Mildly dilated ascending aorta at 4.3 cm. ?Left atrial cavity is mildly dilated. ?Mild tricuspid regurgitation. ?No evidence of pulmonary hypertension. ?Compared to previous study on 10/03/2021, LVEF has improved from Daniel%. Wall motion abnormalities have resolved. ? ?EKG 10/13/2021: ?Sinus rhythm 59 bpm ?LAFB ?QTc 481 msec ? ?Cardiac MRI 10/03/2021: ?1. Moderate LVE with mid/apical anterior wall, septal true apex and ?inferior apical wall motion. Quantitative EF 36%. ?2. Diffuse sub endocardial and mid  myocardial uptake particularly in ?the septum and mid/basal anterior wall not in coronary distribution ?3. Elevated ECV and T2 along with gadolinium uptake suggests ?myocarditis ?4.  Normal RV size and function RVEF 56% no evidence of RV dysplasia ?5.  Small pericardial effusion ?6.  Dilated aortic root 4.0 cm ? ?Echocardiogram 10/03/2021: ?1. Moderate LVE with mid/apical anterior wall, septal true apex and ?inferior apical wall motion. Quantitative EF 36%. ?2. Diffuse sub endocardial and mid myocardial uptake particularly in ?the septum and mid/basal anterior wall not in coronary distribution ?3. Elevated ECV and T2 along with gadolinium uptake suggests ?myocarditis ?4.  Normal RV size and function RVEF 56% no evidence of RV dysplasia ?5.  Small pericardial effusion ?6.  Dilated aortic root 4.0 cm ? ?Recent labs: ?01/01/2022: ?A1c 7.6% ? ?11/22/2021: ?Glucose 220, BUN/Cr 16/1.24. EGFR 65. Na/K 140/3.9. Rest of the CMP normal ?TSH 1.9 normal ? ?10/05/2021: ?Glucose 174, BUN/Cr 14/0.86. EGFR >60. Na/K 130/3.8. Rest of the CMP normal ?H/H 14/44. MCV 87. Platelets 104 ?HbA1C 12.5% ?Chol 180, TG 189, HDL 48, LDL 94 ? ? ?Review of Systems  ?Cardiovascular:  Negative for chest pain, dyspnea on exertion, leg swelling, palpitations and syncope.  ? ?   ? ? ?Vitals:  ? 02/14/22 1105  ?BP: 125/76  ?Pulse: (!) 57  ?Resp: 17  ?Temp: 97.9 ?F (36.6 ?C)  ?SpO2: 98%  ? ? ?Body mass index is 32.23 kg/m?. ?Filed Weights  ? 02/14/22 1105  ?Weight: 239 lb 9.6 oz (108.7 kg)  ? ? ?Objective:  ? Physical Exam ?Vitals reviewed.  ?Constitutional:   ?   General: He is not in acute distress. ?Neck:  ?   Vascular: No JVD.  ?Cardiovascular:  ?   Rate and Rhythm: Normal rate and regular rhythm.  ?   Heart sounds: Normal heart sounds. No murmur heard. ?Pulmonary:  ?   Effort: Pulmonary effort is normal.  ?   Breath sounds: Normal breath sounds. No wheezing or rales.  ?Musculoskeletal:  ?   Right lower leg: No edema.  ?   Left lower leg: No edema.   ? ? ?   ?Assessment & Recommendations:  ? ?66 y.o. caucasian Daniel [name]  with hypertension, asthma, uncontrolled type 2 diabetes mellitus, nonobstructive CAD, acute myocarditis complicated by unstable VT in 09/2021 ? ?Myocarditis, ventricular tachycardia: ?Episode in December 2022. ?Repeat echocardiogram and MRI were reviewed and discussed, details above.  Given that patient's EF had significantly improved LifeVest was discontinued. ?We will resume Entresto and Jardiance ?Continue metoprolol, mexiletine, amiodarone. ?For further recommendations regarding mexiletine and amiodarone to EP as he is being seen by them at the end of this month. ?Patient remains fairly asymptomatic and there is no clinical evidence of acute decompensated heart failure on today's exam. ? ?CAD: ?Diffuse, moderate disease in LAD. ?Continue Asprin 81 mg, Crestor 20 mg, diabetes control. ? ?Type 2 DM: ?A1C 7.6% ?We will defer further management to PCP. ? ?Follow-up in 6 months, sooner if needed.  ? ? ?Alethia Berthold, PA-C ?02/14/2022, 12:00 PM ?Office: [phone] ? ?

## 2022-02-16 ENCOUNTER — Other Ambulatory Visit: Payer: Self-pay

## 2022-02-21 ENCOUNTER — Other Ambulatory Visit: Payer: Self-pay | Admitting: Pharmacist

## 2022-02-21 ENCOUNTER — Other Ambulatory Visit: Payer: Self-pay

## 2022-02-21 NOTE — Chronic Care Management (AMB) (Signed)
Patient seen by Sinda Du, PharmD Candidate on 02/21/22 while they were picking up prescriptions at [place] [facility].  ? ?Patient does not have an automated home blood pressure machine.  ? ?Medication review was performed. They are taking medications as prescribed.  ? ?The following barriers to adherence were noted: ?- Denies concerns with medication access or understanding. ? ?The following interventions were completed:  ?- Patient was educated on medications, including indication and administration ?- Patient was educated on proper technique to check home blood pressure and reminded to bring home machine and readings to next provider appointment ?- Patient was counseled on lifestyle modifications to improve blood pressure. Set goals of exercise by doing sit ups 8 minutes daily.  ? ?Counseled to consider getting BP cuff moving forward.  ? ?Follow up with embedded pharmacist in ~ 3 weeks as scheduled.  ? ?The patient has follow up scheduled:  ?PCP: 04/03/22 ? ? ?Catie Hedwig Morton, PharmD, BCACP ?[place] ?[phone] ? ?

## 2022-02-27 ENCOUNTER — Encounter: Payer: Self-pay | Admitting: Cardiology

## 2022-02-27 ENCOUNTER — Ambulatory Visit (INDEPENDENT_AMBULATORY_CARE_PROVIDER_SITE_OTHER): Payer: Medicaid Other | Admitting: Cardiology

## 2022-02-27 VITALS — BP 110/66 | HR 65 | Ht 72.3 in | Wt 233.0 lb

## 2022-02-27 DIAGNOSIS — I5020 Unspecified systolic (congestive) heart failure: Secondary | ICD-10-CM | POA: Diagnosis not present

## 2022-02-27 DIAGNOSIS — I401 Isolated myocarditis: Secondary | ICD-10-CM | POA: Diagnosis not present

## 2022-02-27 NOTE — Progress Notes (Signed)
Electrophysiology Office Note:    Date:  02/27/2022   ID:  Daniel [name], DOB 1956-03-03, MRN [id number]  PCP:  Kerin Perna, NP  [facility] Cardiologist:  None  [facility] Electrophysiologist:  Vickie Epley, MD   Referring MD: Kerin Perna, NP   Chief Complaint: Follow-up  History of Present Illness:    Daniel [name] is a 66 y.o. male who presents for follow-up. Previously seen by Dr. Virgina Jock. Their medical history includes nonobstructive CAD, hypertension, type 2 diabetes mellitus, and asthma.  In 09/2021 he had acute myocarditis complicated by unstable VT. He had been treated with Entresto, metoprolol, mexiletine, and amiodarone. Repeat Echo and cardiac MRI had shown improvement in LVEF and his LifeVest was discontinued.  He last followed up with Lawerance Cruel, PA-C on 02/14/2022. He reported feeling well with notable improvement in his dyspnea. However, due to cost restraints he had been without Entresto or Jardiance for 1-2 weeks. These were resumed. Deferred to EP for further recommendations regarding mexiletine and amiodarone.  Overall, he is doing okay with no significant symptoms noticed recently.  He confirms his LifeVest was stopped in 12/2021.   He remains compliant and tolerant of his medications. He does admit to infrequently missing a few doses of mexiletine.  He denies any palpitations, chest pain, shortness of breath, or peripheral edema. No lightheadedness, headaches, syncope, orthopnea, or PND.     Past Medical History:  Diagnosis Date   Asthma    Diabetes mellitus without complication ([place])    Hypertension     Past Surgical History:  Procedure Laterality Date   LEFT HEART CATH AND CORONARY ANGIOGRAPHY N/A 10/02/2021   Procedure: LEFT HEART CATH AND CORONARY ANGIOGRAPHY;  Surgeon: Nigel Mormon, MD;  Location: [facility];  Service: Cardiovascular;  Laterality: N/A;    Current Medications: Current Meds   Medication Sig   amiodarone (PACERONE) 200 MG tablet Take 1 tablet (200 mg total) by mouth daily.   aspirin 81 MG EC tablet Take 1 tablet (81 mg total) by mouth daily.   empagliflozin (JARDIANCE) 25 MG TABS tablet Take 1 tablet (25 mg total) by mouth daily before breakfast.   insulin aspart protamine - aspart (NOVOLOG MIX 70/30 FLEXPEN) (70-30) 100 UNIT/ML FlexPen Inject 22 Units into the skin 2 (two) times daily.   Insulin Pen Needle 32G X 4 MM MISC Use to inject insulin twice daily.   metoprolol succinate (TOPROL-XL) 25 MG 24 hr tablet Take 1 tablet (25 mg total) by mouth daily.   mexiletine (MEXITIL) 150 MG capsule Take 1 capsule (150 mg total) by mouth every 8 (eight) hours.   Multiple Vitamin (MULTIVITAMIN WITH MINERALS) TABS tablet Take 1 tablet by mouth daily.   rosuvastatin (CRESTOR) 20 MG tablet Take 1 tablet (20 mg total) by mouth daily.   sacubitril-valsartan (ENTRESTO) 24-26 MG Take 1 tablet by mouth 2 (two) times daily.   Semaglutide,0.25 or 0.5MG /DOS, (OZEMPIC, 0.25 OR 0.5 MG/DOSE,) 2 MG/1.5ML SOPN Inject 0.25 mg into the skin once a week.   simethicone (MYLICON) 0000000 MG chewable tablet Chew 125 mg by mouth every 6 (six) hours as needed for flatulence.   torsemide (DEMADEX) 20 MG tablet Take 1 tablet (20 mg total) by mouth 2 (two) times daily.     Allergies:   Metformin and related   Social History   Socioeconomic History   Marital status: Single    Spouse name: Not on file   Number of children: 0   Years  of education: Not on file   Highest education level: Not on file  Occupational History   Not on file  Tobacco Use   Smoking status: Never    Passive exposure: Never   Smokeless tobacco: Never  Vaping Use   Vaping Use: Never used  Substance and Sexual Activity   Alcohol use: No   Drug use: No   Sexual activity: Never  Other Topics Concern   Not on file  Social History Narrative   Marital status: single; not dating      Children: none      Lives: alone       Employment:  Maintenance/production      Tobacco:  None;       Alcohol: none      Drugs: none        Social Determinants of Corporate investment banker Strain: Not on file  Food Insecurity: Not on file  Transportation Needs: Not on file  Physical Activity: Not on file  Stress: Not on file  Social Connections: Not on file     Family History: The patient's family history includes Cancer in his mother and paternal aunt; Heart attack in his father; Heart disease (age of onset: 55) in his father.  ROS:   Please see the history of present illness.    All other systems reviewed and are negative.  EKGs/Labs/Other Studies Reviewed:    The following studies were reviewed today:  01/26/2022  Cardiac MRI FINDINGS: Mild LAE. Normal RA/RV. Trivial pericardial effusion lateral and posterior to LV . Dilated ascending thoracic aorta 4.1 cm Normal AV tri leaflet. Normal MV with trivial appearing MR. Normal TV/PV   Mild LVE with global hypokinesis and abnormal septal motion. Quantitative EF 42% (EDV 163 cc ESV 95 cc SV 68 cc) Post gadolinium images show mid myocardial uptake in mid/apical inferior wall and   Basal / mid anterior septum. This is not in a coronary distribution.   ECV:  37%   T1 1141 msec   T2 50 msec   IMPRESSION: 1. Mild LVE with global hypokinesis and abnormal septal motion EF 42% this is improved from MRI 10/03/21 when it was 36%   2. Mid myocardial gadolinium uptake in antero-septum and inferior wall The degree of overall gadolinium uptake is less than seen on prior scan 10/03/21   3. Normal T2 with mildly elevated ECV/T1 suggest more chronic phase to myocarditis   4.  Dilated ascending thoracic aorta 4.1 cm  12/22/2021  PCV Echocardiogram Echocardiogram 12/22/2021:  Left ventricle cavity is normal in size. Mild concentric hypertrophy of the left ventricle. Normal LV systolic function with EF 54%. Normal global wall motion. Doppler evidence of grade I  (impaired) diastolic dysfunction, normal LAP.  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 Mildly dilated ascending aorta at 4.3 cm.  Left atrial cavity is mildly dilated.  Mild tricuspid regurgitation.  No evidence of pulmonary hypertension.  Compared to previous study on 10/03/2021, LVEF has improved from 35%. Wall motion abnormalities have resolved.   10/02/2021  Left Heart Cath LM: Normal LAD: No significant prox disease          Vessel tapers very rapidly after Diag 2          Mid to apical LAD is small caliber with 70% mid and 80% dital diffuse diseas          Diag 2 prox 60% disease Lcx: Mid focal 20% stenosis RCA: Very large, dominant vessel  PDA/PLA relatively smaller caliber after dRCA bifurcation          PDA 50% disease   LVEDP 27 mHg LV gram: Moderate global hypokinesis with akinetic apex/anteroapex/inferior apex                LVEF 35-40%   Multiple recurrent VF/VT in cath lab, one episode did not convert after cough/vagal maneuvers etc and required defibrillation   I do not think this is ACS presentation, given the fact that, although disease, mid to distal LAD is patent and should not acutely cause akinetic apex Presentation of sustained VT/VF could be due ay of the following scenarios: Recent/old apical infarct with scar mediated VT Nonischemic cardiomyopathy with arrhythmic complication Takotsubo cardiomyopathy (although by definition, cannot be called this, due to disease in mid to distal LAD   Admit to ICU Continue IV amiodarone Added IV lidocaine Will get cardiac MRI after stabilization  Diagnostic: Dominance: Right    EKG:   EKG is personally reviewed.  02/27/2022: EKG was not ordered.   Recent Labs: 10/02/2021: B Natriuretic Peptide 216.6 10/03/2021: Magnesium 2.3 11/22/2021: TSH 1.940 11/26/2021: ALT 24; BUN 11; Creatinine, Ser 1.30; Hemoglobin 14.6; Platelets 159; Potassium 3.9; Sodium 138   Recent Lipid Panel    Component Value Date/Time    CHOL 180 10/02/2021 1327   TRIG 189 (H) 10/02/2021 1327   HDL 48 10/02/2021 1327   CHOLHDL 3.8 10/02/2021 1327   VLDL 38 10/02/2021 1327   LDLCALC 94 10/02/2021 1327    Physical Exam:    VS:  BP 110/66   Pulse 65   Ht 6' 0.3" (1.836 m)   Wt 233 lb (105.7 kg)   SpO2 97%   BMI 31.34 kg/m     Wt Readings from Last 3 Encounters:  02/27/22 233 lb (105.7 kg)  02/14/22 239 lb 9.6 oz (108.7 kg)  01/01/22 245 lb (111.1 kg)     GEN: Well nourished, well developed in no acute distress HEENT: Normal NECK: No JVD; No carotid bruits LYMPHATICS: No lymphadenopathy CARDIAC: RRR, no murmurs, rubs, gallops RESPIRATORY:  Clear to auscultation without rales, wheezing or rhonchi  ABDOMEN: Soft, non-tender, non-distended MUSCULOSKELETAL:  No edema; No deformity  SKIN: Warm and dry NEUROLOGIC:  Alert and oriented x 3 PSYCHIATRIC:  Normal affect       ASSESSMENT:    1. HFrEF (heart failure with reduced ejection fraction) ([place])   2. Acute idiopathic myocarditis    PLAN:    In order of problems listed above:  #Chronic systolic heart failure #Acute myocarditis #Ventricular tachycardia Patient originally admitted with acute myocarditis complicated by ventricular tachycardia cardiac arrest.  Has been electrically stable since that time on amiodarone and mexiletine. Had liver enzymes checked in mid February which were stable.  TSH also okay mid February.  For his heart failure, continue Jardiance, Toprol, Entresto, torsemide.  For his history of VT in setting of myocarditis, continue amiodarone and mexiletine.  We will plan to recheck CMP, TSH and free T4 around July/August.  At this time, no current indication for ICD given improvement in ejection fraction and initial cardiac arrest in the setting of acute myocarditis.  Follow-up in 3 months with APP.  Lab work at that appointment. If doing well at that appointment, consider stopping mexiletine.     Medication Adjustments/Labs  and Tests Ordered: Current medicines are reviewed at length with the patient today.  Concerns regarding medicines are outlined above.  No orders of the defined types were placed in  this encounter.  No orders of the defined types were placed in this encounter.  Message sent to [facility], PA-C.  I,Mathew Stumpf,acting as a Education administrator for Vickie Epley, MD.,have documented all relevant documentation on the behalf of Vickie Epley, MD,as directed by  Vickie Epley, MD while in the presence of Vickie Epley, MD.  I, Vickie Epley, MD, have reviewed all documentation for this visit. The documentation on 02/27/22 for the exam, diagnosis, procedures, and orders are all accurate and complete.   Signed, Hilton Cork. Quentin Ore, MD, [facility], [facility] 02/27/2022 1:19 PM    Electrophysiology [facility]

## 2022-02-27 NOTE — Patient Instructions (Addendum)
Medication Instructions:  ?Your physician recommends that you continue on your current medications as directed. Please refer to the Current Medication list given to you today. ?*If you need a refill on your cardiac medications before your next appointment, please call your pharmacy* ? ?Lab Work: ?None. ?If you have labs (blood work) drawn today and your tests are completely normal, you will receive your results only by: ?MyChart Message (if you have MyChart) OR ?A paper copy in the mail ?If you have any lab test that is abnormal or we need to change your treatment, we will call you to review the results. ? ?Testing/Procedures: ?None. ? ?Follow-Up: ?At [facility], you and your health needs are our priority.  As part of our continuing mission to provide you with exceptional heart care, we have created designated Provider Care Teams.  These Care Teams include your primary Cardiologist (physician) and Advanced Practice Providers (APPs -  Physician Assistants and Nurse Practitioners) who all work together to provide you with the care you need, when you need it. ? ?Your physician wants you to follow-up in: 3 months with one of the following Advanced Practice Providers on your designated Care Team:   ? ?Renee Ursuy, PA-C ?Michael "Andy" Tillery, PA-C ? ?We recommend signing up for the patient portal called "MyChart".  Sign up information is provided on this After Visit Summary.  MyChart is used to connect with patients for Virtual Visits (Telemedicine).  Patients are able to view lab/test results, encounter notes, upcoming appointments, etc.  Non-urgent messages can be sent to your provider as well.   ?To learn more about what you can do with MyChart, go to [email or web address].   ? ?Any Other Special Instructions Will Be Listed Below (If Applicable). ? ? ? ? ?  ?  ?

## 2022-03-02 ENCOUNTER — Other Ambulatory Visit: Payer: Self-pay

## 2022-03-09 ENCOUNTER — Ambulatory Visit: Payer: Medicaid Other | Attending: Primary Care | Admitting: Pharmacist

## 2022-03-09 ENCOUNTER — Other Ambulatory Visit: Payer: Self-pay

## 2022-03-09 ENCOUNTER — Encounter: Payer: Self-pay | Admitting: Pharmacist

## 2022-03-09 DIAGNOSIS — E118 Type 2 diabetes mellitus with unspecified complications: Secondary | ICD-10-CM | POA: Diagnosis not present

## 2022-03-09 DIAGNOSIS — Z794 Long term (current) use of insulin: Secondary | ICD-10-CM | POA: Diagnosis not present

## 2022-03-09 LAB — POCT GLYCOSYLATED HEMOGLOBIN (HGB A1C): HbA1c, POC (controlled diabetic range): 7 % (ref 0.0–7.0)

## 2022-03-09 MED ORDER — NOVOLOG MIX 70/30 FLEXPEN (70-30) 100 UNIT/ML ~~LOC~~ SUPN
20.0000 [IU] | PEN_INJECTOR | Freq: Two times a day (BID) | SUBCUTANEOUS | 11 refills | Status: DC
  Filled 2022-03-09: qty 15, 38d supply, fill #0
  Filled 2022-04-23: qty 15, 38d supply, fill #1
  Filled 2022-06-12: qty 15, 38d supply, fill #2

## 2022-03-09 MED ORDER — INSULIN PEN NEEDLE 32G X 4 MM MISC
22.0000 [IU] | Freq: Two times a day (BID) | 2 refills | Status: DC
  Filled 2022-03-09: qty 100, 50d supply, fill #0
  Filled 2022-06-12: qty 100, 50d supply, fill #1

## 2022-03-09 MED ORDER — SEMAGLUTIDE(0.25 OR 0.5MG/DOS) 2 MG/3ML ~~LOC~~ SOPN
0.5000 mg | PEN_INJECTOR | SUBCUTANEOUS | 1 refills | Status: DC
  Filled 2022-03-09: qty 3, 56d supply, fill #0
  Filled 2022-03-09: qty 3, 28d supply, fill #0
  Filled 2022-06-12: qty 3, 28d supply, fill #1

## 2022-03-09 NOTE — Progress Notes (Signed)
S:    Daniel [name] is a 66 y.o. male who presents for diabetes evaluation, education, and management. PMH is significant for T2DM, HTN, HFrEF, CAD. Patient was initially referred by Primary Care Provider, Gwinda Passe, NP, on 10/30/21. Pharmacy has been following with last visit on 02/06/2022.   Today, patient arrives in good spirits and presents without assistance. Denies abdominal pain. Does endorse occasional GERD-like symptoms and gas but no NV. His appetite is okay.   Family/Social History:  -MI in father -Never smoker -No alcohol use reported  Current diabetes medications include: Jardiance 25 mg daily, Novolog 70/30 22 units BID, semaglutide 0.25 mg weekly Current hyperlipidemia medications include: rosuvastatin 20 mg daily  Reports adherence to medications.  Patient denies objective hypoglycemic events. Does endorse some sx of hypoglycemia occasionally but does not check BG when these occur.   Home CBGs:  -No longer checking at home   Patient denies nocturia (nighttime urination).  Patient reports neuropathy (nerve pain). Tingling in feet.  Patient denies visual changes. Patient reports self foot exams.   Patient reported dietary habits: Eats 2 meals/day (9am and 9pm). Eats Healthy Choice meals.  - Cut out ginger ale, soft drinks since last visit with pharmacy - Cut out snack foods and sweet foods since last visit with pharmacy   O:   Lab Results  Component Value Date   HGBA1C 7.0 03/09/2022   There were no vitals filed for this visit.  Lipid Panel     Component Value Date/Time   CHOL 180 10/02/2021 1327   TRIG 189 (H) 10/02/2021 1327   HDL 48 10/02/2021 1327   CHOLHDL 3.8 10/02/2021 1327   VLDL 38 10/02/2021 1327   LDLCALC 94 10/02/2021 1327   Clinical Atherosclerotic Cardiovascular Disease (ASCVD): Yes  The 10-year ASCVD risk score (Arnett DK, et al., 2019) is: 18.5%   Values used to calculate the score:     Age: 34 years     Sex: Male     Is  Non-Hispanic African American: No     Diabetic: Yes     Tobacco smoker: No     Systolic Blood Pressure: 110 mmHg     Is BP treated: Yes     HDL Cholesterol: 48 mg/dL     Total Cholesterol: 180 mg/dL   A/P: Diabetes currently controlled. Today's A1c is at goal at 7%. Commended pt for this. Patient is able to verbalize appropriate hypoglycemia management plan. Medication adherence appears appropriate. Will titrate Ozempic and decrease Novolog. May consider changing to basal insulin at follow-up to decrease hypoglycemia risk. -Increase Ozempic to 0.5 mg weekly.  -Continue Jardiance 25 mg daily  -Decrease Novolog 70/30 to 20 units BID. Instructed pt to decrease further to 18u BID if hypoglycemia symptoms persist.  -Anticipate need to decrease Novolog 70/30 dose as we titrate Ozempic.  -Patient educated on purpose, proper use, and potential adverse effects of Ozempic.  -Counseled him to take his insulin BEFORE meals and to start checking BG before meals (fasting) and 2 hours after eating.  -Extensively discussed pathophysiology of diabetes, recommended lifestyle interventions, dietary effects on blood sugar control.  -Counseled on s/sx of and management of hypoglycemia.  -Next A1c anticipated 06/2022.  Written patient instructions provided. Patient verbalized understanding of treatment plan. Total time in face to face counseling 30 minutes.    Follow up with PCP at the end of this month.   Butch Penny, PharmD, [place]y [place], CPP Clinical Pharmacist [facility] [phone]

## 2022-03-12 ENCOUNTER — Other Ambulatory Visit: Payer: Self-pay

## 2022-03-14 ENCOUNTER — Other Ambulatory Visit: Payer: Self-pay

## 2022-03-19 ENCOUNTER — Other Ambulatory Visit: Payer: Self-pay

## 2022-03-26 ENCOUNTER — Other Ambulatory Visit: Payer: Self-pay

## 2022-04-03 ENCOUNTER — Ambulatory Visit (INDEPENDENT_AMBULATORY_CARE_PROVIDER_SITE_OTHER): Payer: Self-pay | Admitting: Primary Care

## 2022-04-12 ENCOUNTER — Other Ambulatory Visit: Payer: Self-pay

## 2022-04-13 ENCOUNTER — Other Ambulatory Visit: Payer: Self-pay

## 2022-04-23 ENCOUNTER — Ambulatory Visit (INDEPENDENT_AMBULATORY_CARE_PROVIDER_SITE_OTHER): Payer: Medicaid Other | Admitting: Primary Care

## 2022-04-23 ENCOUNTER — Other Ambulatory Visit: Payer: Self-pay

## 2022-04-23 ENCOUNTER — Encounter (INDEPENDENT_AMBULATORY_CARE_PROVIDER_SITE_OTHER): Payer: Self-pay | Admitting: Primary Care

## 2022-04-23 VITALS — BP 134/77 | HR 56 | Temp 98.1°F | Ht 72.0 in | Wt 233.0 lb

## 2022-04-23 DIAGNOSIS — E118 Type 2 diabetes mellitus with unspecified complications: Secondary | ICD-10-CM | POA: Diagnosis not present

## 2022-04-23 DIAGNOSIS — Z794 Long term (current) use of insulin: Secondary | ICD-10-CM

## 2022-04-24 ENCOUNTER — Other Ambulatory Visit: Payer: Self-pay

## 2022-04-26 NOTE — Progress Notes (Signed)
[facility]  Daniel [name], is a 66 y.o. male  TZG:[id number]  PRF:[id number]  DOB - 02/03/1956  Chief Complaint  Patient presents with   Follow-up    DM        Subjective:   Daniel [name] is a 66 y.o. male here today for a follow up visit for T2D .Patient denies nocturia (nighttime urination). neuropathy or visual changes. Patient has No headache, No chest pain, No abdominal pain - No Nausea, No new weakness tingling or numbness, No Cough - shortness of breath.   No problems updated.  Allergies  Allergen Reactions   Metformin And Related Nausea Only    Past Medical History:  Diagnosis Date   Asthma    Diabetes mellitus without complication (HCC)    Hypertension     Current Outpatient Medications on File Prior to Visit  Medication Sig Dispense Refill   amiodarone (PACERONE) 200 MG tablet Take 1 tablet (200 mg total) by mouth daily. 90 tablet 3   aspirin 81 MG EC tablet Take 1 tablet (81 mg total) by mouth daily. 90 tablet 3   empagliflozin (JARDIANCE) 25 MG TABS tablet Take 1 tablet (25 mg total) by mouth daily before breakfast. 90 tablet 1   insulin aspart protamine - aspart (NOVOLOG MIX 70/30 FLEXPEN) (70-30) 100 UNIT/ML FlexPen Inject 20 Units into the skin 2 (two) times daily. 15 mL 11   Insulin Pen Needle 32G X 4 MM MISC Use to inject insulin twice daily. 100 each 2   metoprolol succinate (TOPROL-XL) 25 MG 24 hr tablet Take 1 tablet (25 mg total) by mouth daily. 90 tablet 3   mexiletine (MEXITIL) 150 MG capsule Take 1 capsule (150 mg total) by mouth every 8 (eight) hours. 270 capsule 3   Multiple Vitamin (MULTIVITAMIN WITH MINERALS) TABS tablet Take 1 tablet by mouth daily.     rosuvastatin (CRESTOR) 20 MG tablet Take 1 tablet (20 mg total) by mouth daily. 90 tablet 3   sacubitril-valsartan (ENTRESTO) 24-26 MG Take 1 tablet by mouth 2 (two) times daily. 180 tablet 3   Semaglutide,0.25 or 0.5MG /DOS, 2 MG/3ML SOPN Inject 0.5 mg into the skin once a week.  3 mL 1   simethicone (MYLICON) 125 MG chewable tablet Chew 125 mg by mouth every 6 (six) hours as needed for flatulence.     torsemide (DEMADEX) 20 MG tablet Take 1 tablet (20 mg total) by mouth 2 (two) times daily. 180 tablet 3   No current facility-administered medications on file prior to visit.    Objective:   Vitals:   04/23/22 1626  BP: 134/77  Pulse: (!) 56  Temp: 98.1 F (36.7 C)  TempSrc: Oral  SpO2: 95%  Weight: 233 lb (105.7 kg)  Height: 6' (1.829 m)    Exam General appearance : Awake, alert, not in any distress. Speech Clear. Not toxic looking HEENT: Atraumatic and Normocephalic, pupils equally reactive to light and accomodation Neck: Supple, no JVD. No cervical lymphadenopathy.  Chest: Good air entry bilaterally, no added sounds  CVS: S1 S2 regular, no murmurs.  Abdomen: Bowel sounds present, Non tender and not distended with no gaurding, rigidity or rebound. Extremities: B/L Lower Ext shows no edema, both legs are warm to touch Neurology: Awake alert, and oriented X 3, Non focal Skin: No Rash  Data Review Lab Results  Component Value Date   HGBA1C 7.0 03/09/2022   HGBA1C 7.6 (A) 01/01/2022   HGBA1C 12.5 (H) 10/02/2021    Assessment & Plan  Daniel [name] was seen today for follow-up.  Diagnoses and all orders for this visit:  Type 2 diabetes mellitus with complication, with long-term current use of insulin (HCC) Diabetes currently controlled.  A1c is at goal at 7%. Recently seen clinical pharmacist made medication adjustment secondary to some hypoglycemic episodes  Patient have been counseled extensively about nutrition and exercise. Other issues discussed during this visit include: low cholesterol diet, weight control and daily exercise, foot care, annual eye examinations at Ophthalmology, importance of adherence with medications and regular follow-up. We also discussed long term complications of uncontrolled diabetes and hypertension.   Return in about 2  months (around 06/24/2022) for DM/fasting.  The patient was given clear instructions to go to ER or return to medical center if symptoms don't improve, worsen or new problems develop. The patient verbalized understanding. The patient was told to call to get lab results if they haven't heard anything in the next week.   This note has been created with Education officer, environmental. Any transcriptional errors are unintentional.   Grayce Sessions, NP 04/26/2022, 5:08 PM

## 2022-05-21 ENCOUNTER — Other Ambulatory Visit: Payer: Self-pay

## 2022-05-27 NOTE — Progress Notes (Deleted)
Cardiology Office Note Date:  05/27/2022  Patient ID:  Turner, Baillie Nov 07, 1955, MRN [id number] PCP:  Grayce Sessions, NP  Cardiologist:  Dr. Rosemary Holms Electrophysiologist: Dr. Lalla Brothers  ***refresh   Chief Complaint: ***  History of Present Illness: Tsugio Elison is a 66 y.o. male with history of asthma, HTN, DM.  Admitted 10/02/21 with acute onset SOB, EMS found him in VT bolused with amio ended up shocked 4x. Labs with + HS Trop and hypokalemia, continued with NSVTs, runs of VT/VF Treated with amio and lidocaine gtts Cath with no obstructive CAD C.MRI noted an ejection fraction of 35%. Diffuse subendocardial/mid myocardial uptake not in a coronary distribution.  There is also elevated ECV and T2 suggestive of myocarditis.  The RV is normal EP, Dr. Lalla Brothers consulted Eventually off gtts, transitioned to oral amiodarone and mexiletine Recommended life vest and ongoing management of his myocarditis and GDMT to attending cardiology team Discharged 10/05/22  He saw C. Cantwell, PA-C 02/14/22, difficulty with GDMT 2/2 financial constraints, c.MRI and TTE with improved LVEF and life vest was d/c'd.  Med refills sent in for him  He saw Dr. Lalla Brothers 02/27/22, doing well, meds continued planned for EP APP in a few months with surveillance labs.  If remained stable, without symptoms, could consider stopping mexiletine.  *** symptoms *** palps *** syncope *** monitor then stop?? *** amio labs  Past Medical History:  Diagnosis Date   Asthma    Diabetes mellitus without complication (HCC)    Hypertension     Past Surgical History:  Procedure Laterality Date   LEFT HEART CATH AND CORONARY ANGIOGRAPHY N/A 10/02/2021   Procedure: LEFT HEART CATH AND CORONARY ANGIOGRAPHY;  Surgeon: Elder Negus, MD;  Location: [facility];  Service: Cardiovascular;  Laterality: N/A;    Current Outpatient Medications  Medication Sig Dispense Refill   amiodarone (PACERONE) 200 MG  tablet Take 1 tablet (200 mg total) by mouth daily. 90 tablet 3   aspirin 81 MG EC tablet Take 1 tablet (81 mg total) by mouth daily. 90 tablet 3   empagliflozin (JARDIANCE) 25 MG TABS tablet Take 1 tablet (25 mg total) by mouth daily before breakfast. 90 tablet 1   insulin aspart protamine - aspart (NOVOLOG MIX 70/30 FLEXPEN) (70-30) 100 UNIT/ML FlexPen Inject 20 Units into the skin 2 (two) times daily. 15 mL 11   Insulin Pen Needle 32G X 4 MM MISC Use to inject insulin twice daily. 100 each 2   metoprolol succinate (TOPROL-XL) 25 MG 24 hr tablet Take 1 tablet (25 mg total) by mouth daily. 90 tablet 3   mexiletine (MEXITIL) 150 MG capsule Take 1 capsule (150 mg total) by mouth every 8 (eight) hours. 270 capsule 3   Multiple Vitamin (MULTIVITAMIN WITH MINERALS) TABS tablet Take 1 tablet by mouth daily.     rosuvastatin (CRESTOR) 20 MG tablet Take 1 tablet (20 mg total) by mouth daily. 90 tablet 3   sacubitril-valsartan (ENTRESTO) 24-26 MG Take 1 tablet by mouth 2 (two) times daily. 180 tablet 3   Semaglutide,0.25 or 0.5MG /DOS, 2 MG/3ML SOPN Inject 0.5 mg into the skin once a week. 3 mL 1   simethicone (MYLICON) 125 MG chewable tablet Chew 125 mg by mouth every 6 (six) hours as needed for flatulence.     torsemide (DEMADEX) 20 MG tablet Take 1 tablet (20 mg total) by mouth 2 (two) times daily. 180 tablet 3   No current facility-administered medications for this visit.  Allergies:   Metformin and related   Social History:  The patient  reports that he has never smoked. He has never been exposed to tobacco smoke. He has never used smokeless tobacco. He reports that he does not drink alcohol and does not use drugs.   Family History:  The patient's family history includes Cancer in his mother and paternal aunt; Heart attack in his father; Heart disease (age of onset: 48) in his father.  ROS:  Please see the history of present illness.    All other systems are reviewed and otherwise negative.    PHYSICAL EXAM:  VS:  There were no vitals taken for this visit. BMI: There is no height or weight on file to calculate BMI. Well nourished, well developed, in no acute distress HEENT: normocephalic, atraumatic Neck: no JVD, carotid bruits or masses Cardiac:  *** RRR; no significant murmurs, no rubs, or gallops Lungs:  *** CTA b/l, no wheezing, rhonchi or rales Abd: soft, nontender MS: no deformity or *** atrophy Ext: *** no edema Skin: warm and dry, no rash Neuro:  No gross deficits appreciated Psych: euthymic mood, full affect   EKG:  Done today and reviewed by myself shows  ***  Cardiac MRI 01/26/2022: 1. Mild LVE with global hypokinesis and abnormal septal motion EF 42% this is improved from MRI 10/03/21 when it was 36% 2. Mid myocardial gadolinium uptake in antero-septum and inferior wall The degree of overall gadolinium uptake is less than seen on prior scan 10/03/21 3. Normal T2 with mildly elevated ECV/T1 suggest more chronic phase to myocarditis 4.  Dilated ascending thoracic aorta 4.1 cm   PCV ECHOCARDIOGRAM COMPLETE 12/22/2021 Left ventricle cavity is normal in size. Mild concentric hypertrophy of the left ventricle. Normal LV systolic function with EF 54%. Normal global wall motion. Doppler evidence of grade I (impaired) diastolic dysfunction, normal LAP. 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 Mildly dilated ascending aorta at 4.3 cm. Left atrial cavity is mildly dilated. Mild tricuspid regurgitation. No evidence of pulmonary hypertension. Compared to previous study on 10/03/2021, LVEF has improved from 35%. Wall motion abnormalities have resolved.  10/03/2021: LHC LM: Normal LAD: No significant prox disease          Vessel tapers very rapidly after Diag 2          Mid to apical LAD is small caliber with 70% mid and 80% dital diffuse diseas          Diag 2 prox 60% disease Lcx: Mid focal 20% stenosis RCA: Very large, dominant vessel           PDA/PLA relatively  smaller caliber after dRCA bifurcation          PDA 50% disease   Had recurrent VT in lab requiring defib.    cMRI 10/03/2021 shows: 1. Moderate LVE with mid/apical anterior wall, septal true apex and inferior apical wall motion. Quantitative EF 36%. 2. Diffuse sub endocardial and mid myocardial uptake particularly in the septum and mid/basal anterior wall not in coronary distribution 3. Elevated ECV and T2 along with gadolinium uptake suggests myocarditis 4.  Normal RV size and function RVEF 56% no evidence of RV dysplasia 5.  Small pericardial effusion 6.  Dilated aortic root 4.0 cm   Echocardiogram 10/03/2021: 1. Moderate LVE with mid/apical anterior wall, septal true apex and inferior apical wall motion. Quantitative EF 36%. 2. Diffuse sub endocardial and mid myocardial uptake particularly in the septum and mid/basal anterior wall not in  coronary distribution 3. Elevated ECV and T2 along with gadolinium uptake suggests myocarditis 4.  Normal RV size and function RVEF 56% no evidence of RV dysplasia 5.  Small pericardial effusion 6.  Dilated aortic root 4.0 cm   Recent Labs: 10/02/2021: B Natriuretic Peptide 216.6 10/03/2021: Magnesium 2.3 11/22/2021: TSH 1.940 11/26/2021: ALT 24; BUN 11; Creatinine, Ser 1.30; Hemoglobin 14.6; Platelets 159; Potassium 3.9; Sodium 138  10/02/2021: Cholesterol 180; HDL 48; LDL Cholesterol 94; Total CHOL/HDL Ratio 3.8; Triglycerides 189; VLDL 38   CrCl cannot be calculated (Patient's most recent lab result is older than the maximum 21 days allowed.).   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  The patient did not have any concerns regarding medicines.  Norma Fredrickson, PA-C 05/27/2022 7:45 AM     [facility] [address] [place] [address] [phone] (office)  [phone] (fax)

## 2022-05-30 ENCOUNTER — Other Ambulatory Visit: Payer: Self-pay | Admitting: Pharmacist

## 2022-05-30 ENCOUNTER — Other Ambulatory Visit: Payer: Self-pay

## 2022-05-30 ENCOUNTER — Other Ambulatory Visit (INDEPENDENT_AMBULATORY_CARE_PROVIDER_SITE_OTHER): Payer: Self-pay | Admitting: Primary Care

## 2022-05-30 ENCOUNTER — Telehealth: Payer: Self-pay | Admitting: Physician Assistant

## 2022-05-30 ENCOUNTER — Ambulatory Visit: Payer: Medicaid Other | Admitting: Physician Assistant

## 2022-05-30 DIAGNOSIS — E118 Type 2 diabetes mellitus with unspecified complications: Secondary | ICD-10-CM

## 2022-05-30 MED ORDER — EMPAGLIFLOZIN 25 MG PO TABS
25.0000 mg | ORAL_TABLET | Freq: Every day | ORAL | 3 refills | Status: DC
  Filled 2022-05-30: qty 30, 30d supply, fill #0
  Filled 2022-06-22: qty 30, 30d supply, fill #1
  Filled 2022-07-27: qty 30, 30d supply, fill #2
  Filled 2022-08-28: qty 30, 30d supply, fill #3

## 2022-05-30 NOTE — Telephone Encounter (Signed)
Routed to PCP 

## 2022-05-30 NOTE — Telephone Encounter (Signed)
Received a call from [facility] and they said that the patient showed up there for appt

## 2022-05-31 ENCOUNTER — Other Ambulatory Visit: Payer: Self-pay

## 2022-05-31 NOTE — Progress Notes (Signed)
Cardiology Office Note Date:  05/31/2022  Patient ID:  Daniel, [name] [birth year], MRN [id number] PCP:  Kerin Perna, NP  Cardiologist:  Dr. Virgina Jock Electrophysiologist: Dr. Quentin Ore     Chief Complaint: planned f/u  History of Present Illness: Daniel [name] is a 66 y.o. 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 Labs with + HS Trop and hypokalemia, continued with NSVTs, runs of VT/VF Treated with amio and lidocaine gtts Cath with no obstructive CAD C.MRI noted an ejection fraction of 35%. Diffuse subendocardial/mid myocardial uptake not in a coronary distribution.  There is also elevated ECV and T2 suggestive of myocarditis.  The RV is normal EP, Dr. Quentin Ore consulted Eventually off gtts, transitioned to oral amiodarone and mexiletine Recommended life vest and ongoing management of his myocarditis and GDMT to attending cardiology team Discharged 10/05/22  He saw C. Cantwell, PA-C 02/14/22, difficulty with GDMT 2/2 financial constraints, c.MRI and TTE with improved LVEF and life vest was d/c'd.  Med refills sent in for him  He saw Dr. Quentin Ore 02/27/22, doing well, meds continued planned for EP APP in a few months with surveillance labs.  If remained stable, without symptoms, could consider stopping mexiletine.  TODAY He has done really well He will often miss the Semaglutide dose, just forgets. TID mexiletine is hard, will miss the middle dose, often, but not all the time. The rest of his meds he does well with. No CP, palpitations or cardiac awareness No SOB No near syncope or syncope. He will get momentarily lightheaded upon standing but no dizziness otherwise.  Past Medical History:  Diagnosis Date   Asthma    Diabetes mellitus without complication ([place])    Hypertension     Past Surgical History:  Procedure Laterality Date   LEFT  HEART CATH AND CORONARY ANGIOGRAPHY N/A 10/02/2021   Procedure: LEFT HEART CATH AND CORONARY ANGIOGRAPHY;  Surgeon: Nigel Mormon, MD;  Location: [place] [facility];  Service: Cardiovascular;  Laterality: N/A;    Current Outpatient Medications  Medication Sig Dispense Refill   amiodarone (PACERONE) 200 MG tablet Take 1 tablet (200 mg total) by mouth daily. 90 tablet 3   aspirin 81 MG EC tablet Take 1 tablet (81 mg total) by mouth daily. 90 tablet 3   empagliflozin (JARDIANCE) 25 MG TABS tablet Take 1 tablet (25 mg total) by mouth daily before breakfast. 30 tablet 3   insulin aspart protamine - aspart (NOVOLOG MIX 70/30 FLEXPEN) (70-30) 100 UNIT/ML FlexPen Inject 20 Units into the skin 2 (two) times daily. 15 mL 11   Insulin Pen Needle 32G X 4 MM MISC Use to inject insulin twice daily. 100 each 2   metoprolol succinate (TOPROL-XL) 25 MG 24 hr tablet Take 1 tablet (25 mg total) by mouth daily. 90 tablet 3   mexiletine (MEXITIL) 150 MG capsule Take 1 capsule (150 mg total) by mouth every 8 (eight) hours. 270 capsule 3   Multiple Vitamin (MULTIVITAMIN WITH MINERALS) TABS tablet Take 1 tablet by mouth daily.     rosuvastatin (CRESTOR) 20 MG tablet Take 1 tablet (20 mg total) by mouth daily. 90 tablet 3   sacubitril-valsartan (ENTRESTO) 24-26 MG Take 1 tablet by mouth 2 (two) times daily. 180 tablet 3   Semaglutide,0.25 or 0.5MG /DOS, 2 MG/3ML SOPN Inject 0.5 mg into the skin once a week. 3 mL 1  simethicone (MYLICON) 125 MG chewable tablet Chew 125 mg by mouth every 6 (six) hours as needed for flatulence.     torsemide (DEMADEX) 20 MG tablet Take 1 tablet (20 mg total) by mouth 2 (two) times daily. 180 tablet 3   No current facility-administered medications for this visit.    Allergies:   Metformin and related   Social History:  The patient  reports that he has never smoked. He has never been exposed to tobacco smoke. He has never used smokeless tobacco. He reports that he does not drink  alcohol and does not use drugs.   Family History:  The patient's family history includes Cancer in his mother and paternal aunt; Heart attack in his father; Heart disease (age of onset: 22) in his father.  ROS:  Please see the history of present illness.    All other systems are reviewed and otherwise negative.   PHYSICAL EXAM:  VS:  There were no vitals taken for this visit. BMI: There is no height or weight on file to calculate BMI. Well nourished, well developed, in no acute distress HEENT: normocephalic, atraumatic Neck: no JVD, carotid bruits or masses Cardiac:  RRR; no significant murmurs, no rubs, or gallops Lungs:  CTA b/l, no wheezing, rhonchi or rales Abd: soft, nontender MS: no deformity or atrophy Ext: no edema Skin: warm and dry, no rash Neuro:  No gross deficits appreciated Psych: euthymic mood, full affect   EKG:  not done today  Cardiac MRI 01/26/2022: 1. Mild LVE with global hypokinesis and abnormal septal motion EF 42% this is improved from MRI 10/03/21 when it was 36% 2. Mid myocardial gadolinium uptake in antero-septum and inferior wall The degree of overall gadolinium uptake is less than seen on prior scan 10/03/21 3. Normal T2 with mildly elevated ECV/T1 suggest more chronic phase to myocarditis 4.  Dilated ascending thoracic aorta 4.1 cm   PCV ECHOCARDIOGRAM COMPLETE 12/22/2021 Left ventricle cavity is normal in size. Mild concentric hypertrophy of the left ventricle. Normal LV systolic function with EF 54%. Normal global wall motion. Doppler evidence of grade I (impaired) diastolic dysfunction, normal LAP. 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 Mildly dilated ascending aorta at 4.3 cm. Left atrial cavity is mildly dilated. Mild tricuspid regurgitation. No evidence of pulmonary hypertension. Compared to previous study on 10/03/2021, LVEF has improved from 35%. Wall motion abnormalities have resolved.  10/03/2021: LHC LM: Normal LAD: No significant prox  disease          Vessel tapers very rapidly after Diag 2          Mid to apical LAD is small caliber with 70% mid and 80% dital diffuse diseas          Diag 2 prox 60% disease Lcx: Mid focal 20% stenosis RCA: Very large, dominant vessel           PDA/PLA relatively smaller caliber after dRCA bifurcation          PDA 50% disease   Had recurrent VT in lab requiring defib.    cMRI 10/03/2021 shows: 1. Moderate LVE with mid/apical anterior wall, septal true apex and inferior apical wall motion. Quantitative EF 36%. 2. Diffuse sub endocardial and mid myocardial uptake particularly in the septum and mid/basal anterior wall not in coronary distribution 3. Elevated ECV and T2 along with gadolinium uptake suggests myocarditis 4.  Normal RV size and function RVEF 56% no evidence of RV dysplasia 5.  Small pericardial effusion 6.  Dilated aortic root 4.0 cm   Echocardiogram 10/03/2021: 1. Moderate LVE with mid/apical anterior wall, septal true apex and inferior apical wall motion. Quantitative EF 36%. 2. Diffuse sub endocardial and mid myocardial uptake particularly in the septum and mid/basal anterior wall not in coronary distribution 3. Elevated ECV and T2 along with gadolinium uptake suggests myocarditis 4.  Normal RV size and function RVEF 56% no evidence of RV dysplasia 5.  Small pericardial effusion 6.  Dilated aortic root 4.0 cm   Recent Labs: 10/02/2021: B Natriuretic Peptide 216.6 10/03/2021: Magnesium 2.3 11/22/2021: TSH 1.940 11/26/2021: ALT 24; BUN 11; Creatinine, Ser 1.30; Hemoglobin 14.6; Platelets 159; Potassium 3.9; Sodium 138  10/02/2021: Cholesterol 180; HDL 48; LDL Cholesterol 94; Total CHOL/HDL Ratio 3.8; Triglycerides 189; VLDL 38   CrCl cannot be calculated (Patient's most recent lab result is older than the maximum 21 days allowed.).   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  Disposition: F/u with [place] in 3 mo, sooner if needed  Current medicines are reviewed at length with the patient today.  The patient did not have any concerns regarding medicines.  Norma Fredrickson, PA-C 05/31/2022 1:08 PM     [facility] [address] [place] [address] [phone] (office)  [phone] (fax)

## 2022-06-01 ENCOUNTER — Other Ambulatory Visit: Payer: Self-pay

## 2022-06-01 ENCOUNTER — Encounter: Payer: Self-pay | Admitting: Physician Assistant

## 2022-06-01 ENCOUNTER — Ambulatory Visit (INDEPENDENT_AMBULATORY_CARE_PROVIDER_SITE_OTHER): Payer: Medicaid Other | Admitting: Physician Assistant

## 2022-06-01 VITALS — BP 102/68 | HR 56 | Ht 72.0 in | Wt 229.0 lb

## 2022-06-01 DIAGNOSIS — I472 Ventricular tachycardia, unspecified: Secondary | ICD-10-CM | POA: Diagnosis not present

## 2022-06-01 DIAGNOSIS — I428 Other cardiomyopathies: Secondary | ICD-10-CM

## 2022-06-01 DIAGNOSIS — Z79899 Other long term (current) drug therapy: Secondary | ICD-10-CM

## 2022-06-01 LAB — COMPREHENSIVE METABOLIC PANEL
ALT: 28 IU/L (ref 0–44)
AST: 22 IU/L (ref 0–40)
Albumin/Globulin Ratio: 2 (ref 1.2–2.2)
Albumin: 4.5 g/dL (ref 3.9–4.9)
Alkaline Phosphatase: 72 IU/L (ref 44–121)
BUN/Creatinine Ratio: 11 (ref 10–24)
BUN: 12 mg/dL (ref 8–27)
Bilirubin Total: 0.6 mg/dL (ref 0.0–1.2)
CO2: 28 mmol/L (ref 20–29)
Calcium: 8.8 mg/dL (ref 8.6–10.2)
Chloride: 101 mmol/L (ref 96–106)
Creatinine, Ser: 1.07 mg/dL (ref 0.76–1.27)
Globulin, Total: 2.2 g/dL (ref 1.5–4.5)
Glucose: 104 mg/dL — ABNORMAL HIGH (ref 70–99)
Potassium: 4.2 mmol/L (ref 3.5–5.2)
Sodium: 143 mmol/L (ref 134–144)
Total Protein: 6.7 g/dL (ref 6.0–8.5)
eGFR: 77 mL/min/{1.73_m2} (ref >59)

## 2022-06-01 LAB — TSH: TSH: 1.37 u[IU]/mL (ref 0.450–4.500)

## 2022-06-01 LAB — HEPATIC FUNCTION PANEL: Bilirubin, Direct: 0.21 mg/dL (ref 0.00–0.40)

## 2022-06-01 NOTE — Patient Instructions (Addendum)
Medication Instructions:    STOP TAKING AND REMOVE THIS MEDICATION FROM YOUR MEDICATION LIST:  MEXILETINE 150 MG  *If you need a refill on your cardiac medications before your next appointment, please call your pharmacy*   Lab Work:  CMET LFT AND TSH TODAY   If you have labs (blood work) drawn today and your tests are completely normal, you will receive your results only by: MyChart Message (if you have MyChart) OR A paper copy in the mail If you have any lab test that is abnormal or we need to change your treatment, we will call you to review the results.   Testing/Procedures: NONE ORDERED  TODAY     Follow-Up: At [facility], you and your health needs are our priority.  As part of our continuing mission to provide you with exceptional heart care, we have created designated Provider Care Teams.  These Care Teams include your primary Cardiologist (physician) and Advanced Practice Providers (APPs -  Physician Assistants and Nurse Practitioners) who all work together to provide you with the care you need, when you need it.  We recommend signing up for the patient portal called "MyChart".  Sign up information is provided on this After Visit Summary.  MyChart is used to connect with patients for Virtual Visits (Telemedicine).  Patients are able to view lab/test results, encounter notes, upcoming appointments, etc.  Non-urgent messages can be sent to your provider as well.   To learn more about what you can do with MyChart, go to [email or web address].    Your next appointment:   2-3  month(s)  The format for your next appointment:   In Person  Provider:   Francis Dowse, PA-C    Other Instructions   Important Information About Sugar

## 2022-06-06 ENCOUNTER — Other Ambulatory Visit: Payer: Self-pay

## 2022-06-12 ENCOUNTER — Other Ambulatory Visit: Payer: Self-pay

## 2022-06-20 ENCOUNTER — Other Ambulatory Visit: Payer: Self-pay

## 2022-06-22 ENCOUNTER — Other Ambulatory Visit: Payer: Self-pay

## 2022-06-26 ENCOUNTER — Ambulatory Visit (INDEPENDENT_AMBULATORY_CARE_PROVIDER_SITE_OTHER): Payer: Medicaid Other | Admitting: Primary Care

## 2022-06-26 ENCOUNTER — Encounter (INDEPENDENT_AMBULATORY_CARE_PROVIDER_SITE_OTHER): Payer: Self-pay | Admitting: Primary Care

## 2022-06-26 VITALS — BP 114/76 | HR 64 | Resp 16 | Wt 229.2 lb

## 2022-06-26 DIAGNOSIS — Z0000 Encounter for general adult medical examination without abnormal findings: Secondary | ICD-10-CM

## 2022-06-26 DIAGNOSIS — Z794 Long term (current) use of insulin: Secondary | ICD-10-CM | POA: Diagnosis not present

## 2022-06-26 DIAGNOSIS — E118 Type 2 diabetes mellitus with unspecified complications: Secondary | ICD-10-CM | POA: Diagnosis not present

## 2022-06-26 LAB — POCT GLYCOSYLATED HEMOGLOBIN (HGB A1C): HbA1c, POC (controlled diabetic range): 6.2 % (ref 0.0–7.0)

## 2022-06-26 LAB — GLUCOSE, POCT (MANUAL RESULT ENTRY): POC Glucose: 129 mg/dl — AB (ref 70–99)

## 2022-06-26 MED ORDER — NOVOLOG MIX 70/30 FLEXPEN (70-30) 100 UNIT/ML ~~LOC~~ SUPN: 20.0000 [IU] | PEN_INJECTOR | Freq: Every day | SUBCUTANEOUS | 4 refills | Status: DC

## 2022-06-26 NOTE — Progress Notes (Signed)
Subjective:  Patient ID: Daniel [name], male    DOB: 08-23-1956  Age: 66 y.o. MRN: [id number]  CC: Diabetes   HPI Leam Madero presents forFollow-up of diabetes. Patient does not check blood sugar at home. Denies any complaints or concerns.  Compliant with meds - Yes Checking CBGs? No  Fasting avg -   Postprandial average -  Exercising regularly? - Yes walking  Watching carbohydrate intake? - Yes Neuropathy ? - Yes Hypoglycemic events - No  - Recovers with :   Pertinent ROS:  Polyuria - No Polydipsia - No Vision problems - No  Medications as noted below. Taking them regularly without complication/adverse reaction being reported today.   History Rawlin has a past medical history of Asthma, Diabetes mellitus without complication (HCC), and Hypertension.   He has a past surgical history that includes LEFT HEART CATH AND CORONARY ANGIOGRAPHY (N/A, 10/02/2021).   His family history includes Cancer in his mother and paternal aunt; Heart attack in his father; Heart disease (age of onset: 65) in his father.He reports that he has never smoked. He has never been exposed to tobacco smoke. He has never used smokeless tobacco. He reports that he does not drink alcohol and does not use drugs.  Current Outpatient Medications on File Prior to Visit  Medication Sig Dispense Refill   amiodarone (PACERONE) 200 MG tablet Take 1 tablet (200 mg total) by mouth daily. 90 tablet 3   aspirin 81 MG EC tablet Take 1 tablet (81 mg total) by mouth daily. 90 tablet 3   empagliflozin (JARDIANCE) 25 MG TABS tablet Take 1 tablet (25 mg total) by mouth daily before breakfast. 30 tablet 3   Insulin Pen Needle 32G X 4 MM MISC Use to inject insulin twice daily. 100 each 2   metoprolol succinate (TOPROL-XL) 25 MG 24 hr tablet Take 1 tablet (25 mg total) by mouth daily. 90 tablet 3   Multiple Vitamin (MULTIVITAMIN WITH MINERALS) TABS tablet Take 1 tablet by mouth daily.     rosuvastatin (CRESTOR) 20 MG tablet  Take 1 tablet (20 mg total) by mouth daily. 90 tablet 3   sacubitril-valsartan (ENTRESTO) 24-26 MG Take 1 tablet by mouth 2 (two) times daily. 180 tablet 3   Semaglutide,0.25 or 0.5MG /DOS, 2 MG/3ML SOPN Inject 0.5 mg into the skin once a week. 3 mL 1   simethicone (MYLICON) 125 MG chewable tablet Chew 125 mg by mouth every 6 (six) hours as needed for flatulence.     torsemide (DEMADEX) 20 MG tablet Take 1 tablet (20 mg total) by mouth 2 (two) times daily. 180 tablet 3   No current facility-administered medications on file prior to visit.    ROS Comprehensive ROS Pertinent positive and negative noted in HPI   Objective:  BP 114/76   Pulse 64   Resp 16   Wt 229 lb 3.2 oz (104 kg)   SpO2 96%   BMI 31.09 kg/m   BP Readings from Last 3 Encounters:  06/26/22 114/76  06/01/22 102/68  04/23/22 134/77    Wt Readings from Last 3 Encounters:  06/26/22 229 lb 3.2 oz (104 kg)  06/01/22 229 lb (103.9 kg)  04/23/22 233 lb (105.7 kg)    Physical Exam Vitals reviewed.  Constitutional:      Appearance: He is obese.  HENT:     Head: Normocephalic.     Right Ear: External ear normal.     Left Ear: External ear normal.     Nose: Nose normal.  Eyes:     Extraocular Movements: Extraocular movements intact.  Cardiovascular:     Rate and Rhythm: Normal rate and regular rhythm.  Pulmonary:     Effort: Pulmonary effort is normal.     Breath sounds: Normal breath sounds.  Abdominal:     General: Bowel sounds are normal. There is distension.     Palpations: Abdomen is soft.  Musculoskeletal:        General: Normal range of motion.     Cervical back: Normal range of motion and neck supple.  Neurological:     Mental Status: He is oriented to person, place, and time.  Psychiatric:        Mood and Affect: Mood normal.        Behavior: Behavior normal.    Lab Results  Component Value Date   HGBA1C 6.2 06/26/2022   HGBA1C 7.0 03/09/2022   HGBA1C 7.6 (A) 01/01/2022    Lab Results   Component Value Date   WBC 7.0 11/26/2021   HGB 14.6 11/26/2021   HCT 44.1 11/26/2021   PLT 159 11/26/2021   GLUCOSE 104 (H) 06/01/2022   CHOL 180 10/02/2021   TRIG 189 (H) 10/02/2021   HDL 48 10/02/2021   LDLCALC 94 10/02/2021   ALT 28 06/01/2022   AST 22 06/01/2022   NA 143 06/01/2022   K 4.2 06/01/2022   CL 101 06/01/2022   CREATININE 1.07 06/01/2022   BUN 12 06/01/2022   CO2 28 06/01/2022   TSH 1.370 06/01/2022   INR 1.0 10/02/2021   HGBA1C 6.2 06/26/2022     Assessment & Plan:   Davieon was seen today for diabetes.  Diagnoses and all orders for this visit:  Type 2 diabetes mellitus with complication, with long-term current use of insulin (HCC) A1c today is 6.2 previously 7.3 months ago improving will adjust medications to avoid hypoglycemia from 70/30 20 twice daily to 70 3020 units daily Well controlled continue to monitor carbs and increase excercising. -     POCT glucose (manual entry) -     POCT glycosylated hemoglobin (Hb A1C) (Hb A1C) 6.2  -     Cancel: CBC -     Cancel: Microalbumin / creatinine urine ratio -     insulin aspart protamine - aspart (NOVOLOG MIX 70/30 FLEXPEN) (70-30) 100 UNIT/ML FlexPen; Inject 20 Units into the skin daily with breakfast. -     Microalbumin / creatinine urine ratio; Future -     CBC; Future  Healthcare maintenance -     Cancel: HCV Ab w Reflex to Quant PCR -     Cancel: HIV Antibody (routine testing w rflx) -     HCV Ab w Reflex to Quant PCR; Future -     HIV Antibody (routine testing w rflx); Future    I have changed Chrissie Noa Pelzer's NovoLOG Mix 70/30 FlexPen. I am also having him maintain his multivitamin with minerals, simethicone, aspirin EC, torsemide, sacubitril-valsartan, rosuvastatin, metoprolol succinate, amiodarone, Semaglutide(0.25 or 0.5MG /DOS), Insulin Pen Needle, and empagliflozin.  Meds ordered this encounter  Medications   insulin aspart protamine - aspart (NOVOLOG MIX 70/30 FLEXPEN) (70-30) 100 UNIT/ML  FlexPen    Sig: Inject 20 Units into the skin daily with breakfast.    Dispense:  3 mL    Refill:  4    Order Specific Question:   Supervising Provider    Answer:   Tresa Garter [id number]     Follow-up:   Return in about 6 months (  around 12/25/2022) for DM/fasting.  The above assessment and management plan was discussed with the patient. The patient verbalized understanding of and has agreed to the management plan. Patient is aware to call the clinic if symptoms fail to improve or worsen. Patient is aware when to return to the clinic for a follow-up visit. Patient educated on when it is appropriate to go to the emergency department.   Gwinda Passe, NP-C

## 2022-06-29 ENCOUNTER — Other Ambulatory Visit: Payer: Self-pay

## 2022-07-20 ENCOUNTER — Other Ambulatory Visit: Payer: Self-pay

## 2022-07-27 ENCOUNTER — Other Ambulatory Visit: Payer: Self-pay

## 2022-07-27 ENCOUNTER — Other Ambulatory Visit: Payer: Self-pay | Admitting: Family Medicine

## 2022-07-27 DIAGNOSIS — Z794 Long term (current) use of insulin: Secondary | ICD-10-CM

## 2022-07-27 MED ORDER — OZEMPIC (0.25 OR 0.5 MG/DOSE) 2 MG/3ML ~~LOC~~ SOPN
0.5000 mg | PEN_INJECTOR | SUBCUTANEOUS | 0 refills | Status: DC
  Filled 2022-07-27: qty 3, 28d supply, fill #0

## 2022-07-27 NOTE — Telephone Encounter (Signed)
Requested Prescriptions  Pending Prescriptions Disp Refills  . Semaglutide,0.25 or 0.5MG /DOS, (OZEMPIC, 0.25 OR 0.5 MG/DOSE,) 2 MG/3ML SOPN 3 mL 0    Sig: Inject 0.5 mg into the skin once a week.     Endocrinology:  Diabetes - GLP-1 Receptor Agonists - semaglutide Passed - 07/27/2022 12:10 PM      Passed - HBA1C in normal range and within 180 days    HbA1c, POC (controlled diabetic range)  Date Value Ref Range Status  06/26/2022 6.2 0.0 - 7.0 % Final         Passed - Cr in normal range and within 360 days    Creatinine, Ser  Date Value Ref Range Status  06/01/2022 1.07 0.76 - 1.27 mg/dL Final         Passed - Valid encounter within last 6 months    Recent Outpatient Visits          1 month ago Type 2 diabetes mellitus with complication, with long-term current use of insulin ([place])   [place] [facility] Juluis Mire P, NP   3 months ago Type 2 diabetes mellitus with complication, with long-term current use of insulin ([place])   [place] [facility] Juluis Mire P, NP   4 months ago Type 2 diabetes mellitus with complication, with long-term current use of insulin ([place])   [place], Annie Main L, RPH-CPP   5 months ago Type 2 diabetes mellitus with complication, with long-term current use of insulin [facility])   [place], Annie Main L, RPH-CPP   6 months ago Type 2 diabetes mellitus with complication, with long-term current use of insulin ([place])   [place], [place] P, NP      Future Appointments            In 1 week Baldwin [place], PA-C [place]. [facility], [facility]   In 3 months Oletta Lamas, Milford Cage, NP [place]

## 2022-07-31 ENCOUNTER — Other Ambulatory Visit: Payer: Self-pay

## 2022-08-01 ENCOUNTER — Other Ambulatory Visit: Payer: Self-pay

## 2022-08-05 NOTE — Progress Notes (Unsigned)
Cardiology Office Note Date:  08/05/2022  Patient ID:  Daniel [name], Daniel [name] Mar 06, 1961, MRN [id number] PCP:  Kerin Perna, NP  Cardiologist:  Dr. Virgina Jock Electrophysiologist: Dr. Quentin Ore     Chief Complaint:  *** 49mo  History of Present Illness: [name] is a 66 y.o. 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 Labs with + HS Trop and hypokalemia, continued with NSVTs, runs of VT/VF Treated with amio and lidocaine gtts Cath with no obstructive CAD C.MRI noted an ejection fraction of 35%. Diffuse subendocardial/mid myocardial uptake not in a coronary distribution.  There is also elevated ECV and T2 suggestive of myocarditis.  The RV is normal EP, Dr. Quentin Ore consulted Eventually off gtts, transitioned to oral amiodarone and mexiletine Recommended life vest and ongoing management of his myocarditis and GDMT to attending cardiology team Discharged 10/05/22  He saw C. Cantwell, PA-C 02/14/22, difficulty with GDMT 2/2 financial constraints, c.MRI and TTE with improved LVEF and life vest was d/c'd.  Med refills sent in for him  He saw Dr. Quentin Ore 02/27/22, doing well, meds continued planned for EP APP in a few months with surveillance labs.  If remained stable, without symptoms, could consider stopping mexiletine.  I saw him 06/01/22 He has done really well He will often miss the Semaglutide dose, just forgets. TID mexiletine is hard, will miss the middle dose, often, but not all the time. The rest of his meds he does well with. No CP, palpitations or cardiac awareness No SOB No near syncope or syncope. He will get momentarily lightheaded upon standing but no dizziness otherwise. Discussed importance of medication compliance His mexiletine was stopped and planned for 3 mo f/u Amio labs were done and ok  *** symptoms, syncope, ?VT *** have him  see CL Jan or so to decide AMIO duration *** med compliacne *** cards f/u  Past Medical History:  Diagnosis Date   Asthma    Diabetes mellitus without complication ([place])    Hypertension     Past Surgical History:  Procedure Laterality Date   LEFT HEART CATH AND CORONARY ANGIOGRAPHY N/A 10/02/2021   Procedure: LEFT HEART CATH AND CORONARY ANGIOGRAPHY;  Surgeon: Nigel Mormon, MD;  Location: [place] [facility];  Service: Cardiovascular;  Laterality: N/A;    Current Outpatient Medications  Medication Sig Dispense Refill   amiodarone (PACERONE) 200 MG tablet Take 1 tablet (200 mg total) by mouth daily. 90 tablet 3   aspirin 81 MG EC tablet Take 1 tablet (81 mg total) by mouth daily. 90 tablet 3   empagliflozin (JARDIANCE) 25 MG TABS tablet Take 1 tablet (25 mg total) by mouth daily before breakfast. 30 tablet 3   insulin aspart protamine - aspart (NOVOLOG MIX 70/30 FLEXPEN) (70-30) 100 UNIT/ML FlexPen Inject 20 Units into the skin daily with breakfast. 3 mL 4   Insulin Pen Needle 32G X 4 MM MISC Use to inject insulin twice daily. 100 each 2   metoprolol succinate (TOPROL-XL) 25 MG 24 hr tablet Take 1 tablet (25 mg total) by mouth daily. 90 tablet 3   Multiple Vitamin (MULTIVITAMIN WITH MINERALS) TABS tablet Take 1 tablet by mouth daily.     rosuvastatin (CRESTOR) 20 MG tablet Take 1 tablet (20 mg total) by mouth daily. 90 tablet 3   sacubitril-valsartan (ENTRESTO) 24-26 MG Take 1 tablet by mouth 2 (two)  times daily. 180 tablet 3   Semaglutide,0.25 or 0.5MG /DOS, (OZEMPIC, 0.25 OR 0.5 MG/DOSE,) 2 MG/3ML SOPN Inject 0.5 mg into the skin once a week. 3 mL 0   simethicone (MYLICON) 0000000 MG chewable tablet Chew 125 mg by mouth every 6 (six) hours as needed for flatulence.     torsemide (DEMADEX) 20 MG tablet Take 1 tablet (20 mg total) by mouth 2 (two) times daily. 180 tablet 3   No current facility-administered medications for this visit.    Allergies:   Metformin and related    Social History:  The patient  reports that he has never smoked. He has never been exposed to tobacco smoke. He has never used smokeless tobacco. He reports that he does not drink alcohol and does not use drugs.   Family History:  The patient's family history includes Cancer in his mother and paternal aunt; Heart attack in his father; Heart disease (age of onset: 71) in his father.  ROS:  Please see the history of present illness.    All other systems are reviewed and otherwise negative.   PHYSICAL EXAM:  VS:  There were no vitals taken for this visit. BMI: There is no height or weight on file to calculate BMI. Well nourished, well developed, in no acute distress HEENT: normocephalic, atraumatic Neck: no JVD, carotid bruits or masses Cardiac:  *** RRR; no significant murmurs, no rubs, or gallops Lungs:  *** CTA b/l, no wheezing, rhonchi or rales Abd: soft, nontender MS: no deformity or atrophy Ext: *** no edema Skin: warm and dry, no rash Neuro:  No gross deficits appreciated Psych: euthymic mood, full affect   EKG:  not done today  Cardiac MRI 01/26/2022: 1. Mild LVE with global hypokinesis and abnormal septal motion EF 42% this is improved from MRI 10/03/21 when it was 36% 2. Mid myocardial gadolinium uptake in antero-septum and inferior wall The degree of overall gadolinium uptake is less than seen on prior scan 10/03/21 3. Normal T2 with mildly elevated ECV/T1 suggest more chronic phase to myocarditis 4.  Dilated ascending thoracic aorta 4.1 cm   PCV ECHOCARDIOGRAM COMPLETE 12/22/2021 Left ventricle cavity is normal in size. Mild concentric hypertrophy of the left ventricle. Normal LV systolic function with EF 54%. Normal global wall motion. Doppler evidence of grade I (impaired) diastolic dysfunction, normal LAP. 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 Mildly dilated ascending aorta at 4.3 cm. Left atrial cavity is mildly dilated. Mild tricuspid regurgitation. No evidence of  pulmonary hypertension. Compared to previous study on 10/03/2021, LVEF has improved from 35%. Wall motion abnormalities have resolved.  10/03/2021: LHC LM: Normal LAD: No significant prox disease          Vessel tapers very rapidly after Diag 2          Mid to apical LAD is small caliber with 70% mid and 80% dital diffuse diseas          Diag 2 prox 60% disease Lcx: Mid focal 20% stenosis RCA: Very large, dominant vessel           PDA/PLA relatively smaller caliber after dRCA bifurcation          PDA 50% disease   Had recurrent VT in lab requiring defib.    cMRI 10/03/2021 shows: 1. Moderate LVE with mid/apical anterior wall, septal true apex and inferior apical wall motion. Quantitative EF 36%. 2. Diffuse sub endocardial and mid myocardial uptake particularly in the septum and mid/basal anterior wall not  in coronary distribution 3. Elevated ECV and T2 along with gadolinium uptake suggests myocarditis 4.  Normal RV size and function RVEF 56% no evidence of RV dysplasia 5.  Small pericardial effusion 6.  Dilated aortic root 4.0 cm   Echocardiogram 10/03/2021: 1. Moderate LVE with mid/apical anterior wall, septal true apex and inferior apical wall motion. Quantitative EF 36%. 2. Diffuse sub endocardial and mid myocardial uptake particularly in the septum and mid/basal anterior wall not in coronary distribution 3. Elevated ECV and T2 along with gadolinium uptake suggests myocarditis 4.  Normal RV size and function RVEF 56% no evidence of RV dysplasia 5.  Small pericardial effusion 6.  Dilated aortic root 4.0 cm   Recent Labs: 10/02/2021: B Natriuretic Peptide 216.6 10/03/2021: Magnesium 2.3 11/26/2021: Hemoglobin 14.6; Platelets 159 06/01/2022: ALT 28; BUN 12; Creatinine, Ser 1.07; Potassium 4.2; Sodium 143; TSH 1.370  10/02/2021: Cholesterol 180; HDL 48; LDL Cholesterol 94; Total CHOL/HDL Ratio 3.8; Triglycerides 189; VLDL 38   CrCl cannot be calculated (Patient's most recent  lab result is older than the maximum 21 days allowed.).   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  The patient did not have any concerns regarding medicines.  Venetia Night, PA-C 08/05/2022 4:51 PM     [address] [place] [id number] [phone] (office)  [phone] (fax)

## 2022-08-07 ENCOUNTER — Encounter: Payer: Self-pay | Admitting: Physician Assistant

## 2022-08-07 ENCOUNTER — Other Ambulatory Visit: Payer: Self-pay

## 2022-08-07 ENCOUNTER — Ambulatory Visit: Payer: Medicaid Other | Attending: Physician Assistant | Admitting: Physician Assistant

## 2022-08-07 VITALS — BP 104/60 | HR 71 | Ht 72.0 in | Wt 229.0 lb

## 2022-08-07 DIAGNOSIS — I493 Ventricular premature depolarization: Secondary | ICD-10-CM

## 2022-08-07 DIAGNOSIS — I472 Ventricular tachycardia, unspecified: Secondary | ICD-10-CM | POA: Diagnosis not present

## 2022-08-07 DIAGNOSIS — I428 Other cardiomyopathies: Secondary | ICD-10-CM

## 2022-08-07 DIAGNOSIS — Z79899 Other long term (current) drug therapy: Secondary | ICD-10-CM | POA: Diagnosis not present

## 2022-08-07 MED ORDER — MEXILETINE HCL 150 MG PO CAPS
150.0000 mg | ORAL_CAPSULE | Freq: Two times a day (BID) | ORAL | 2 refills | Status: DC
  Filled 2022-08-07: qty 180, 90d supply, fill #0

## 2022-08-07 NOTE — Patient Instructions (Addendum)
Medication Instructions:   START TAKING: MEXILETINE 150 MG TWICE A DAY    *If you need a refill on your cardiac medications before your next appointment, please call your pharmacy*   Lab Work: BMET AND MAG   If you have labs (blood work) drawn today and your tests are completely normal, you will receive your results only by: [employer] (if you have MyChart) OR A paper copy in the mail If you have any lab test that is abnormal or we need to change your treatment, we will call you to review the results.   Testing/Procedures: NONE ORDERED  TODAY      Follow-Up: At [facility], you and your health needs are our priority.  As part of our continuing mission to provide you with exceptional heart care, we have created designated Provider Care Teams.  These Care Teams include your primary Cardiologist (physician) and Advanced Practice Providers (APPs -  Physician Assistants and Nurse Practitioners) who all work together to provide you with the care you need, when you need it.  We recommend signing up for the patient portal called "MyChart".  Sign up information is provided on this After Visit Summary.  MyChart is used to connect with patients for Virtual Visits (Telemedicine).  Patients are able to view lab/test results, encounter notes, upcoming appointments, etc.  Non-urgent messages can be sent to your provider as well.   To learn more about what you can do with MyChart, go to [email or web address].    Your next appointment:    4-6  week(s)  The format for your next appointment:    In Person  Provider:   Tommye Standard, PA-C    Other Instructions   Important Information About Sugar

## 2022-08-08 LAB — BASIC METABOLIC PANEL
BUN/Creatinine Ratio: 9 — ABNORMAL LOW (ref 10–24)
BUN: 10 mg/dL (ref 8–27)
CO2: 29 mmol/L (ref 20–29)
Calcium: 8.7 mg/dL (ref 8.6–10.2)
Chloride: 101 mmol/L (ref 96–106)
Creatinine, Ser: 1.06 mg/dL (ref 0.76–1.27)
Glucose: 81 mg/dL (ref 70–99)
Potassium: 3.9 mmol/L (ref 3.5–5.2)
Sodium: 142 mmol/L (ref 134–144)
eGFR: 78 mL/min/{1.73_m2} (ref >59)

## 2022-08-08 LAB — MAGNESIUM: Magnesium: 2.5 mg/dL — ABNORMAL HIGH (ref 1.6–2.3)

## 2022-08-17 ENCOUNTER — Ambulatory Visit: Payer: Medicaid Other | Admitting: Cardiology

## 2022-08-17 ENCOUNTER — Ambulatory Visit: Payer: Medicaid Other | Admitting: Student

## 2022-08-17 ENCOUNTER — Encounter: Payer: Self-pay | Admitting: Cardiology

## 2022-08-17 VITALS — BP 101/64 | HR 48 | Temp 97.2°F | Resp 16 | Ht 72.0 in | Wt 225.0 lb

## 2022-08-17 DIAGNOSIS — I5020 Unspecified systolic (congestive) heart failure: Secondary | ICD-10-CM

## 2022-08-17 DIAGNOSIS — I493 Ventricular premature depolarization: Secondary | ICD-10-CM

## 2022-08-17 DIAGNOSIS — I7121 Aneurysm of the ascending aorta, without rupture: Secondary | ICD-10-CM | POA: Insufficient documentation

## 2022-08-17 NOTE — Progress Notes (Signed)
Follow up visit  Subjective:   Koron Godeaux, male    DOB: 08-06-1956, 66 y.o.   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 Patient is following up closely with EP. He is started on Mexillitene. If no improement in PVC's, plan is to consider ablation.   Current Outpatient Medications:    amiodarone (PACERONE) 200 MG tablet, Take 1 tablet (200 mg total) by mouth daily., Disp: 90 tablet, Rfl: 3   aspirin 81 MG EC tablet, Take 1 tablet (81 mg total) by mouth daily., Disp: 90 tablet, Rfl: 3   empagliflozin (JARDIANCE) 25 MG TABS tablet, Take 1 tablet (25 mg total) by mouth daily before breakfast., Disp: 30 tablet, Rfl: 3   insulin aspart protamine - aspart (NOVOLOG MIX 70/30 FLEXPEN) (70-30) 100 UNIT/ML FlexPen, Inject 20 Units into the skin daily with breakfast., Disp: 3 mL, Rfl: 4   Insulin Pen Needle 32G X 4 MM MISC, Use to inject insulin twice daily., Disp: 100 each, Rfl: 2   metoprolol succinate (TOPROL-XL) 25 MG 24 hr tablet, Take 1 tablet (25 mg total) by mouth daily., Disp: 90 tablet, Rfl: 3   mexiletine (MEXITIL) 150 MG capsule, Take 1 capsule (150 mg total) by mouth 2 (two) times daily., Disp: 180 capsule, Rfl: 2   Multiple Vitamin (MULTIVITAMIN WITH MINERALS) TABS tablet, Take 1 tablet by mouth daily., Disp: , Rfl:    rosuvastatin (CRESTOR) 20 MG tablet, Take 1 tablet (20 mg total) by mouth daily., Disp: 90 tablet, Rfl: 3   sacubitril-valsartan (ENTRESTO) 24-26 MG, Take 1 tablet by mouth 2 (two) times daily., Disp: 180 tablet, Rfl: 3   Semaglutide,0.25 or 0.5MG/DOS, (OZEMPIC, 0.25 OR 0.5 MG/DOSE,) 2 MG/3ML SOPN, Inject 0.5 mg into the skin once a week., Disp: 3 mL, Rfl: 0   simethicone  (MYLICON) 962 MG chewable tablet, Chew 125 mg by mouth every 6 (six) hours as needed for flatulence., Disp: , Rfl:    torsemide (DEMADEX) 20 MG tablet, Take 1 tablet (20 mg total) by mouth 2 (two) times daily., Disp: 180 tablet, Rfl: 3  Cardiovascular & other pertient studies:  EKG 08/17/2022: Sinus rhythm 63 bpm First degree A-V block RBBB, LAFB Frequent PVCs  Echocardiogram 12/22/2021:  Left ventricle cavity is normal in size. Mild concentric hypertrophy of  the left ventricle. Normal LV systolic function with EF 54%. Normal global  wall motion. Doppler evidence of grade I (impaired) diastolic dysfunction,  normal LAP.  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 Mildly dilated ascending aorta at  4.3 cm.  Left atrial cavity is mildly dilated.  Mild tricuspid regurgitation.  No evidence of pulmonary hypertension.  Compared to previous study on 10/03/2021, LVEF has improved from 35%. Wall  motion abnormalities have resolved.   Recent labs: 08/07/2022: Glucose 81, BUN/Cr 10/1.06. EGFR 78. Na/K 142/3.9. Mg 2.5.  11/22/2021: Glucose 220, BUN/Cr 16/1.24. EGFR 65. Na/K 140/3.9. Rest of the CMP normal TSH 1.9 normal  10/05/2021: Glucose 174, BUN/Cr 14/0.86. EGFR >60. Na/K 130/3.8. Rest of the CMP normal H/H 14/44. MCV 87. Platelets 104 HbA1C 12.5% Chol 180, TG 189, HDL 48, LDL 94   Review of Systems  Constitutional: Positive for diaphoresis.  Cardiovascular:  Negative  for chest pain, dyspnea on exertion, leg swelling, palpitations and syncope.         Vitals:   08/17/22 1134  BP: 101/64  Pulse: (!) 48  Resp: 16  Temp: (!) 97.2 F (36.2 C)  SpO2: 99%    Body mass index is 30.52 kg/m. Filed Weights   08/17/22 1134  Weight: 225 lb (102.1 kg)    Objective:   Physical Exam Vitals and nursing note reviewed.  Constitutional:      General: He is not in acute distress.    Comments: LifeVest in place  Neck:     Vascular: No JVD.  Cardiovascular:     Rate and Rhythm:  Normal rate and regular rhythm.     Heart sounds: Normal heart sounds. No murmur heard. Pulmonary:     Effort: Pulmonary effort is normal.     Breath sounds: Normal breath sounds. No wheezing or rales.  Musculoskeletal:     Right lower leg: No edema.     Left lower leg: No edema.          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 HFrEF resolved. Normalization of EF (Echocardiogram 12/2021). Persistent PVCs on Mexillitene. Has follow up with EP next month.  TAA: 4.1-4.3 c, (Echocardiogram 12/2021). I discovered this on chart review after the patient has left. BP and HR are well controlled. He needs CTA aorta for surveillance. Will order and follow up.  CAD: Diffuse, moderate disease in LAD. Continue Asprin 81 mg, Crestor 20 mg, diabetes control.  Type 2 DM: Has regular f/u w/PCP.  F/u in 1 year   Nigel Mormon, MD Pager: [phone] Office: [phone]

## 2022-08-21 ENCOUNTER — Other Ambulatory Visit: Payer: Self-pay

## 2022-08-28 ENCOUNTER — Other Ambulatory Visit: Payer: Self-pay | Admitting: Primary Care

## 2022-08-28 ENCOUNTER — Other Ambulatory Visit: Payer: Self-pay

## 2022-08-28 DIAGNOSIS — E118 Type 2 diabetes mellitus with unspecified complications: Secondary | ICD-10-CM

## 2022-08-28 MED ORDER — OZEMPIC (0.25 OR 0.5 MG/DOSE) 2 MG/3ML ~~LOC~~ SOPN
0.5000 mg | PEN_INJECTOR | SUBCUTANEOUS | 0 refills | Status: DC
  Filled 2022-08-28: qty 3, 28d supply, fill #0

## 2022-08-29 ENCOUNTER — Other Ambulatory Visit: Payer: Self-pay

## 2022-08-31 ENCOUNTER — Other Ambulatory Visit: Payer: Self-pay

## 2022-09-16 NOTE — Progress Notes (Unsigned)
Cardiology Office Note Date:  08/05/2022  Patient ID:  Daniel [name], Daniel [name] [birth year], MRN [id number] PCP:  Grayce Sessions, NP  Cardiologist:  Dr. Rosemary Holms Electrophysiologist: Dr. Lalla Brothers     Chief Complaint:   6wk planned f/u  History of Present Illness: Daniel [name] is a 66 y.o. 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 Labs with + HS Trop and hypokalemia, continued with NSVTs, runs of VT/VF Treated with amio and lidocaine gtts Cath with no obstructive CAD C.MRI noted an ejection fraction of 35%. Diffuse subendocardial/mid myocardial uptake not in a coronary distribution.  There is also elevated ECV and T2 suggestive of myocarditis.  The RV is normal EP, Dr. Lalla Brothers consulted Eventually off gtts, transitioned to oral amiodarone and mexiletine Recommended life vest and ongoing management of his myocarditis and GDMT to attending cardiology team Discharged 10/05/22  He saw C. Cantwell, PA-C 02/14/22, difficulty with GDMT 2/2 financial constraints, c.MRI and TTE with improved LVEF and life vest was d/c'd.  Med refills sent in for him  He saw Dr. Lalla Brothers 02/27/22, doing well, meds continued planned for EP APP in a few months with surveillance labs.  If remained stable, without symptoms, could consider stopping mexiletine.  I saw him 06/01/22 He has done really well He will often miss the Semaglutide dose, just forgets. TID mexiletine is hard, will miss the middle dose, often, but not all the time. The rest of his meds he does well with. No CP, palpitations or cardiac awareness No SOB No near syncope or syncope. He will get momentarily lightheaded upon standing but no dizziness otherwise. Discussed importance of medication compliance His mexiletine was stopped and planned for 3 mo f/u Amio labs were done and ok  I saw him 08/07/22 He feels  well No palpitations or cardiac awareness No dizzy spells, near syncope or syncope. No SOB He c/w Dr. Spero Curb, sees them in a couple weeks. EKG noted trigeminal PVCs and a couplet In d/w Dr. Lalla Brothers, started mexiletine and planned for 6 week visit.  If not suppressed could consider ablation  He saw Dr. Rosemary Holms 08/17/22 EKG again with PVCs No changes were made.  Today he is doing well. Unaware of PVCs. Denies SOB, CP, syncope or presyncope. Denies DOE, able to walk easily upstairs in 3-story home, and walk from home to bus stop a couple blocks away.   Denies swelling.  Past Medical History:  Diagnosis Date   Asthma    Diabetes mellitus without complication (HCC)    Hypertension     Past Surgical History:  Procedure Laterality Date   LEFT HEART CATH AND CORONARY ANGIOGRAPHY N/A 10/02/2021   Procedure: LEFT HEART CATH AND CORONARY ANGIOGRAPHY;  Surgeon: Elder Negus, MD;  Location: [facility];  Service: Cardiovascular;  Laterality: N/A;    Current Outpatient Medications  Medication Sig Dispense Refill   amiodarone (PACERONE) 200 MG tablet Take 1 tablet (200 mg total) by mouth daily. 90 tablet 3   aspirin 81 MG EC tablet Take 1 tablet (81 mg total) by mouth daily. 90 tablet 3   empagliflozin (JARDIANCE) 25 MG TABS tablet Take 1 tablet (25 mg total) by mouth daily before breakfast. 30 tablet 3   insulin aspart protamine - aspart (NOVOLOG MIX 70/30 FLEXPEN) (70-30) 100 UNIT/ML FlexPen Inject 20 Units into the skin daily with breakfast. 3 mL  4   Insulin Pen Needle 32G X 4 MM MISC Use to inject insulin twice daily. 100 each 2   metoprolol succinate (TOPROL-XL) 25 MG 24 hr tablet Take 1 tablet (25 mg total) by mouth daily. 90 tablet 3   Multiple Vitamin (MULTIVITAMIN WITH MINERALS) TABS tablet Take 1 tablet by mouth daily.     rosuvastatin (CRESTOR) 20 MG tablet Take 1 tablet (20 mg total) by mouth daily. 90 tablet 3   sacubitril-valsartan (ENTRESTO) 24-26 MG  Take 1 tablet by mouth 2 (two) times daily. 180 tablet 3   Semaglutide,0.25 or 0.5MG /DOS, (OZEMPIC, 0.25 OR 0.5 MG/DOSE,) 2 MG/3ML SOPN Inject 0.5 mg into the skin once a week. 3 mL 0   simethicone (MYLICON) 125 MG chewable tablet Chew 125 mg by mouth every 6 (six) hours as needed for flatulence.     torsemide (DEMADEX) 20 MG tablet Take 1 tablet (20 mg total) by mouth 2 (two) times daily. 180 tablet 3   No current facility-administered medications for this visit.    Allergies:   Metformin and related   Social History:  The patient  reports that he has never smoked. He has never been exposed to tobacco smoke. He has never used smokeless tobacco. He reports that he does not drink alcohol and does not use drugs.   Family History:  The patient's family history includes Cancer in his mother and paternal aunt; Heart attack in his father; Heart disease (age of onset: 57) in his father.  ROS:  Please see the history of present illness.    All other systems are reviewed and otherwise negative.   PHYSICAL EXAM:  VS:  There were no vitals taken for this visit. BMI: There is no height or weight on file to calculate BMI. Well nourished, well developed, in no acute distress HEENT: normocephalic, atraumatic Neck: no JVD, carotid bruits or masses Cardiac:  RRR; some extrasystoles, no significant murmurs, no rubs, or gallops Lungs:   CTA b/l, no wheezing, rhonchi or rales Abd: soft, nontender MS: no deformity or atrophy Ext:  no edema Skin: warm and dry, no rash Neuro:  No gross deficits appreciated Psych: euthymic mood, full affect   EKG:  done today and reviewed by myself SR with PVCs, rate 75bpm; LAD, RBBB  Cardiac MRI 01/26/2022: 1. Mild LVE with global hypokinesis and abnormal septal motion EF 42% this is improved from MRI 10/03/21 when it was 36% 2. Mid myocardial gadolinium uptake in antero-septum and inferior wall The degree of overall gadolinium uptake is less than seen on prior scan  10/03/21 3. Normal T2 with mildly elevated ECV/T1 suggest more chronic phase to myocarditis 4.  Dilated ascending thoracic aorta 4.1 cm   PCV ECHOCARDIOGRAM COMPLETE 12/22/2021 Left ventricle cavity is normal in size. Mild concentric hypertrophy of the left ventricle. Normal LV systolic function with EF 54%. Normal global wall motion. Doppler evidence of grade I (impaired) diastolic dysfunction, normal LAP. 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 Mildly dilated ascending aorta at 4.3 cm. Left atrial cavity is mildly dilated. Mild tricuspid regurgitation. No evidence of pulmonary hypertension. Compared to previous study on 10/03/2021, LVEF has improved from 35%. Wall motion abnormalities have resolved.  10/03/2021: LHC LM: Normal LAD: No significant prox disease          Vessel tapers very rapidly after Diag 2          Mid to apical LAD is small caliber with 70% mid and 80% dital diffuse diseas  Diag 2 prox 60% disease Lcx: Mid focal 20% stenosis RCA: Very large, dominant vessel           PDA/PLA relatively smaller caliber after dRCA bifurcation          PDA 50% disease   Had recurrent VT in lab requiring defib.    cMRI 10/03/2021 shows: 1. Moderate LVE with mid/apical anterior wall, septal true apex and inferior apical wall motion. Quantitative EF 36%. 2. Diffuse sub endocardial and mid myocardial uptake particularly in the septum and mid/basal anterior wall not in coronary distribution 3. Elevated ECV and T2 along with gadolinium uptake suggests myocarditis 4.  Normal RV size and function RVEF 56% no evidence of RV dysplasia 5.  Small pericardial effusion 6.  Dilated aortic root 4.0 cm   Echocardiogram 10/03/2021: 1. Moderate LVE with mid/apical anterior wall, septal true apex and inferior apical wall motion. Quantitative EF 36%. 2. Diffuse sub endocardial and mid myocardial uptake particularly in the septum and mid/basal anterior wall not in coronary distribution 3.  Elevated ECV and T2 along with gadolinium uptake suggests myocarditis 4.  Normal RV size and function RVEF 56% no evidence of RV dysplasia 5.  Small pericardial effusion 6.  Dilated aortic root 4.0 cm   Recent Labs: 10/02/2021: B Natriuretic Peptide 216.6 10/03/2021: Magnesium 2.3 11/26/2021: Hemoglobin 14.6; Platelets 159 06/01/2022: ALT 28; BUN 12; Creatinine, Ser 1.07; Potassium 4.2; Sodium 143; TSH 1.370  10/02/2021: Cholesterol 180; HDL 48; LDL Cholesterol 94; Total CHOL/HDL Ratio 3.8; Triglycerides 189; VLDL 38   CrCl cannot be calculated (Patient's most recent lab result is older than the maximum 21 days allowed.).   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 PVCs, pt is unaware of them Tolerating Mexiletine 150mg  BID well 3-day zio to assess burden If burden elevated, consider increase mexiletine dose and/or ablation   Disposition: 3-day zio  62mon with Renee/APP, sooner if zio shows PVC burden high   Current medicines are reviewed at length with the patient today.  The patient did not have any concerns regarding medicines.  Signed0mo, NP 09/18/22 8:59 AM   [facility] [address] [place] [phone] (office)  [phone] (fax)

## 2022-09-18 ENCOUNTER — Ambulatory Visit: Payer: Medicaid Other | Attending: Cardiology

## 2022-09-18 ENCOUNTER — Encounter: Payer: Self-pay | Admitting: Physician Assistant

## 2022-09-18 ENCOUNTER — Other Ambulatory Visit: Payer: Self-pay

## 2022-09-18 ENCOUNTER — Ambulatory Visit: Payer: Medicaid Other | Attending: Physician Assistant | Admitting: Cardiology

## 2022-09-18 VITALS — BP 114/72 | HR 76 | Ht 72.0 in | Wt 216.4 lb

## 2022-09-18 DIAGNOSIS — I493 Ventricular premature depolarization: Secondary | ICD-10-CM | POA: Diagnosis not present

## 2022-09-18 DIAGNOSIS — I25118 Atherosclerotic heart disease of native coronary artery with other forms of angina pectoris: Secondary | ICD-10-CM | POA: Diagnosis not present

## 2022-09-18 DIAGNOSIS — I5020 Unspecified systolic (congestive) heart failure: Secondary | ICD-10-CM

## 2022-09-18 DIAGNOSIS — Z79899 Other long term (current) drug therapy: Secondary | ICD-10-CM

## 2022-09-18 DIAGNOSIS — I472 Ventricular tachycardia, unspecified: Secondary | ICD-10-CM

## 2022-09-18 MED ORDER — ROSUVASTATIN CALCIUM 20 MG PO TABS
20.0000 mg | ORAL_TABLET | Freq: Every day | ORAL | 3 refills | Status: DC
  Filled 2022-09-18: qty 90, 90d supply, fill #0
  Filled 2023-01-08: qty 30, 30d supply, fill #1
  Filled 2023-02-13: qty 30, 30d supply, fill #2
  Filled 2023-03-08: qty 30, 30d supply, fill #3
  Filled 2023-04-24: qty 30, 30d supply, fill #4
  Filled 2023-05-27: qty 30, 30d supply, fill #5
  Filled 2023-07-03: qty 30, 30d supply, fill #6
  Filled 2023-07-30: qty 30, 30d supply, fill #7
  Filled 2023-09-02: qty 30, 30d supply, fill #8

## 2022-09-18 NOTE — Progress Notes (Unsigned)
Enrolled patient for a 3 day Zio XT monitor to be mailed to patients home ° °Dr Lambert to read °

## 2022-09-18 NOTE — Patient Instructions (Signed)
Medication Instructions:   Your physician recommends that you continue on your current medications as directed. Please refer to the Current Medication list given to you today.  *If you need a refill on your cardiac medications before your next appointment, please call your pharmacy*   Lab Work: TODAY: CMET AND TSH   If you have labs (blood work) drawn today and your tests are completely normal, you will receive your results only by: [place] (if you have MyChart) OR A paper copy in the mail If you have any lab test that is abnormal or we need to change your treatment, we will call you to review the results.   Testing/Procedures: Your physician has recommended that you wear an event monitor. Event monitors are medical devices that record the heart's electrical activity. Doctors most often [place] these monitors to diagnose arrhythmias. Arrhythmias are problems with the speed or rhythm of the heartbeat. The monitor is a small, portable device. You can wear one while you do your normal daily activities. This is usually used to diagnose what is causing palpitations/syncope (passing out).    Follow-Up: At [facility], you and your health needs are our priority.  As part of our continuing mission to provide you with exceptional heart care, we have created designated Provider Care Teams.  These Care Teams include your primary Cardiologist (physician) and Advanced Practice Providers (APPs -  Physician Assistants and Nurse Practitioners) who all work together to provide you with the care you need, when you need it.  We recommend signing up for the patient portal called "MyChart".  Sign up information is provided on this After Visit Summary.  MyChart is used to connect with patients for Virtual Visits (Telemedicine).  Patients are able to view lab/test results, encounter notes, upcoming appointments, etc.  Non-urgent messages can be sent to your provider as well.   To learn more about what  you can do with MyChart, go to [email or web address].    Your next appointment:   3 month(s)  The format for your next appointment:   In Person  Provider:   Tommye Standard, PA-C    Other Instructions  [place] Monitor Instructions  Your physician has requested you wear a ZIO patch monitor for  3 days.  This is a single patch monitor. [employer] supplies one patch monitor per enrollment. Additional stickers are not available. Please do not apply patch if you will be having a Nuclear Stress Test,  Echocardiogram, Cardiac CT, MRI, or Chest Xray during the period you would be wearing the  monitor. The patch cannot be worn during these tests. You cannot remove and re-apply the  ZIO XT patch monitor.  Your ZIO patch monitor will be mailed 3 day USPS to your address on file. It may take 3-5 days  to receive your monitor after you have been enrolled.  Once you have received your monitor, please review the enclosed instructions. Your monitor  has already been registered assigning a specific monitor serial # to you.  Billing and Patient Assistance Program Information  We have supplied [employer] with any of your insurance information on file for billing purposes. [employer] offers a sliding scale Patient Assistance Program for patients that do not have  insurance, or whose insurance does not completely cover the cost of the ZIO monitor.  You must apply for the Patient Assistance Program to qualify for this discounted rate.  To apply, please call [employer] at [phone], select option 4, select option 2, ask  to apply for  Patient Assistance Program. Meredeth Ide will ask your household income, and how many people  are in your household. They will quote your out-of-pocket cost based on that information.  [employer] will also be able to set up a 43-month, interest-free payment plan if needed.  Applying the monitor   Shave hair from upper left chest.  Hold abrader disc by orange tab. Rub abrader in  40 strokes over the upper left chest as  indicated in your monitor instructions.  Clean area with 4 enclosed alcohol pads. Let dry.  Apply patch as indicated in monitor instructions. Patch will be placed under collarbone on left  side of chest with arrow pointing upward.  Rub patch adhesive wings for 2 minutes. Remove white label marked "1". Remove the white  label marked "2". Rub patch adhesive wings for 2 additional minutes.  While looking in a mirror, press and release button in center of patch. A small green light will  flash 3-4 times. This will be your only indicator that the monitor has been turned on.  Do not shower for the first 24 hours. You may shower after the first 24 hours.  Press the button if you feel a symptom. You will hear a small click. Record Date, Time and  Symptom in the Patient Logbook.  When you are ready to remove the patch, follow instructions on the last 2 pages of Patient  Logbook. Stick patch monitor onto the last page of Patient Logbook.  Place Patient Logbook in the blue and white box. Use locking tab on box and tape box closed  securely. The blue and white box has prepaid postage on it. Please place it in the mailbox as  soon as possible. Your physician should have your test results approximately 7 days after the  monitor has been mailed back to [facility].  Call [facility] Customer Care at [phone] if you have questions regarding  your ZIO XT patch monitor. Call them immediately if you see an orange light blinking on your  monitor.  If your monitor falls off in less than 4 days, contact our Monitor department at [phone].  If your monitor becomes loose or falls off after 4 days call [employer] at [phone] for  suggestions on securing your monitor   Important Information About Sugar

## 2022-09-19 LAB — COMPREHENSIVE METABOLIC PANEL
ALT: 17 IU/L (ref 0–44)
AST: 14 IU/L (ref 0–40)
Albumin/Globulin Ratio: 1.5 (ref 1.2–2.2)
Albumin: 4 g/dL (ref 3.9–4.9)
Alkaline Phosphatase: 79 IU/L (ref 44–121)
BUN/Creatinine Ratio: 8 — ABNORMAL LOW (ref 10–24)
BUN: 9 mg/dL (ref 8–27)
Bilirubin Total: 0.5 mg/dL (ref 0.0–1.2)
CO2: 28 mmol/L (ref 20–29)
Calcium: 9 mg/dL (ref 8.6–10.2)
Chloride: 101 mmol/L (ref 96–106)
Creatinine, Ser: 1.17 mg/dL (ref 0.76–1.27)
Globulin, Total: 2.6 g/dL (ref 1.5–4.5)
Glucose: 143 mg/dL — ABNORMAL HIGH (ref 70–99)
Potassium: 3.6 mmol/L (ref 3.5–5.2)
Sodium: 142 mmol/L (ref 134–144)
Total Protein: 6.6 g/dL (ref 6.0–8.5)
eGFR: 70 mL/min/{1.73_m2} (ref >59)

## 2022-09-19 LAB — TSH: TSH: 0.916 u[IU]/mL (ref 0.450–4.500)

## 2022-09-22 DIAGNOSIS — I493 Ventricular premature depolarization: Secondary | ICD-10-CM

## 2022-09-26 ENCOUNTER — Other Ambulatory Visit: Payer: Self-pay

## 2022-09-26 ENCOUNTER — Other Ambulatory Visit: Payer: Self-pay | Admitting: Family Medicine

## 2022-09-26 DIAGNOSIS — E118 Type 2 diabetes mellitus with unspecified complications: Secondary | ICD-10-CM

## 2022-09-26 MED ORDER — EMPAGLIFLOZIN 25 MG PO TABS
25.0000 mg | ORAL_TABLET | Freq: Every day | ORAL | 3 refills | Status: DC
  Filled 2022-09-26: qty 30, 30d supply, fill #0

## 2022-09-27 ENCOUNTER — Other Ambulatory Visit: Payer: Self-pay

## 2022-10-03 ENCOUNTER — Other Ambulatory Visit: Payer: Self-pay

## 2022-10-03 ENCOUNTER — Other Ambulatory Visit: Payer: Self-pay | Admitting: Primary Care

## 2022-10-03 DIAGNOSIS — Z794 Long term (current) use of insulin: Secondary | ICD-10-CM

## 2022-10-03 MED ORDER — OZEMPIC (0.25 OR 0.5 MG/DOSE) 2 MG/3ML ~~LOC~~ SOPN
0.5000 mg | PEN_INJECTOR | SUBCUTANEOUS | 1 refills | Status: DC
  Filled 2022-10-03: qty 3, 28d supply, fill #0
  Filled 2022-10-26: qty 3, 28d supply, fill #1

## 2022-10-04 ENCOUNTER — Other Ambulatory Visit: Payer: Self-pay

## 2022-10-12 ENCOUNTER — Other Ambulatory Visit: Payer: Self-pay

## 2022-10-12 ENCOUNTER — Telehealth: Payer: Self-pay | Admitting: Physician Assistant

## 2022-10-12 MED ORDER — MEXILETINE HCL 150 MG PO CAPS
150.0000 mg | ORAL_CAPSULE | Freq: Three times a day (TID) | ORAL | 11 refills | Status: DC
  Filled 2022-10-12 – 2022-10-26 (×2): qty 90, 30d supply, fill #0
  Filled 2022-10-26: qty 30, 10d supply, fill #0
  Filled 2022-10-26: qty 60, 20d supply, fill #0
  Filled 2022-10-26: qty 30, 10d supply, fill #0
  Filled 2022-10-26: qty 60, 20d supply, fill #0
  Filled 2022-12-11: qty 90, 30d supply, fill #1
  Filled 2023-01-23: qty 90, 30d supply, fill #2
  Filled 2023-03-08: qty 90, 30d supply, fill #3
  Filled 2023-04-12: qty 90, 30d supply, fill #4
  Filled 2023-05-20: qty 90, 30d supply, fill #5
  Filled 2023-06-24: qty 90, 30d supply, fill #6
  Filled 2023-07-30: qty 90, 30d supply, fill #7
  Filled 2023-08-12: qty 90, 30d supply, fill #8
  Filled 2023-09-25: qty 90, 30d supply, fill #9

## 2022-10-12 NOTE — Telephone Encounter (Signed)
Patient's friend Cleatrice Burke is calling back for results of patient

## 2022-10-12 NOTE — Telephone Encounter (Signed)
Pt calling back for results

## 2022-10-12 NOTE — Telephone Encounter (Signed)
-----   Message from Mamie Levers, NP sent at 10/11/2022  8:29 AM EST ----- Please call patient to review zio results.  Still having quite a bit of PVCs.  Increase mexiletine to TID - in the past he has struggled to remember to take TID. Can he set an alarm on his phone to remember? Take in AM, when gets home, and then right before bed?   Follow-up with Renee already scheduled.

## 2022-10-12 NOTE — Telephone Encounter (Signed)
Called and spoke with patient. He states that he's not working any longer so he thinks he can now remember to take the Mexitil TID. Med sent to pharmacy on file at increased dose.

## 2022-10-15 ENCOUNTER — Other Ambulatory Visit: Payer: Self-pay

## 2022-10-26 ENCOUNTER — Other Ambulatory Visit: Payer: Self-pay

## 2022-10-26 ENCOUNTER — Encounter (INDEPENDENT_AMBULATORY_CARE_PROVIDER_SITE_OTHER): Payer: Self-pay | Admitting: Primary Care

## 2022-10-26 ENCOUNTER — Ambulatory Visit (INDEPENDENT_AMBULATORY_CARE_PROVIDER_SITE_OTHER): Payer: Medicaid Other | Admitting: Primary Care

## 2022-10-26 VITALS — BP 110/73 | HR 66 | Resp 16 | Ht 72.0 in | Wt 215.6 lb

## 2022-10-26 DIAGNOSIS — E118 Type 2 diabetes mellitus with unspecified complications: Secondary | ICD-10-CM

## 2022-10-26 DIAGNOSIS — Z1159 Encounter for screening for other viral diseases: Secondary | ICD-10-CM | POA: Diagnosis not present

## 2022-10-26 DIAGNOSIS — Z760 Encounter for issue of repeat prescription: Secondary | ICD-10-CM | POA: Diagnosis not present

## 2022-10-26 DIAGNOSIS — Z794 Long term (current) use of insulin: Secondary | ICD-10-CM

## 2022-10-26 DIAGNOSIS — Z0000 Encounter for general adult medical examination without abnormal findings: Secondary | ICD-10-CM | POA: Diagnosis not present

## 2022-10-26 DIAGNOSIS — Z13220 Encounter for screening for lipoid disorders: Secondary | ICD-10-CM

## 2022-10-26 MED ORDER — INSULIN PEN NEEDLE 32G X 4 MM MISC
22.0000 [IU] | Freq: Two times a day (BID) | 2 refills | Status: DC
  Filled 2022-10-26: qty 100, 50d supply, fill #0
  Filled 2023-06-03: qty 100, 50d supply, fill #1

## 2022-10-26 MED ORDER — EMPAGLIFLOZIN 25 MG PO TABS
25.0000 mg | ORAL_TABLET | Freq: Every day | ORAL | 3 refills | Status: DC
  Filled 2022-10-26: qty 90, 90d supply, fill #0
  Filled 2023-03-08: qty 90, 90d supply, fill #1

## 2022-10-26 MED ORDER — NOVOLOG MIX 70/30 FLEXPEN (70-30) 100 UNIT/ML ~~LOC~~ SUPN
20.0000 [IU] | PEN_INJECTOR | Freq: Every day | SUBCUTANEOUS | 4 refills | Status: DC
  Filled 2022-10-26: qty 6, 30d supply, fill #0
  Filled 2022-12-11: qty 6, 30d supply, fill #1
  Filled 2023-03-01: qty 3, 15d supply, fill #2

## 2022-10-26 NOTE — Progress Notes (Signed)
[place]  Daniel [name], is a 67 y.o. male  OTL:[id number]  HRC:[id number]  DOB - 25-Aug-1956  Chief Complaint  Patient presents with   Diabetes   Hypertension       Subjective:   [name] is a 67 y.o. male here today for a follow up for  hypertension .Patient has No headache, No chest pain, No abdominal pain - No Nausea, No new weakness tingling or numbness, No Cough - shortness of breath. Also, management of diabetes denies polyuria, polydipsia, polyphagia or vision changes.   No problems updated.  Allergies  Allergen Reactions   Metformin And Related Nausea Only    Past Medical History:  Diagnosis Date   Asthma    Diabetes mellitus without complication ([place])    Hypertension     Current Outpatient Medications on File Prior to Visit  Medication Sig Dispense Refill   amiodarone (PACERONE) 200 MG tablet Take 1 tablet (200 mg total) by mouth daily. 90 tablet 3   aspirin 81 MG EC tablet Take 1 tablet (81 mg total) by mouth daily. 90 tablet 3   cyanocobalamin (VITAMIN B12) 1000 MCG tablet Take 1,000 mcg by mouth daily.     metoprolol succinate (TOPROL-XL) 25 MG 24 hr tablet Take 1 tablet (25 mg total) by mouth daily. 90 tablet 3   mexiletine (MEXITIL) 150 MG capsule Take 1 capsule (150 mg total) by mouth 3 (three) times daily. 90 capsule 11   Multiple Vitamin (MULTIVITAMIN WITH MINERALS) TABS tablet Take 1 tablet by mouth daily.     Omega-3 Fatty Acids (FISH OIL PO) Take by mouth. daily     rosuvastatin (CRESTOR) 20 MG tablet Take 1 tablet (20 mg total) by mouth daily. 90 tablet 3   sacubitril-valsartan (ENTRESTO) 24-26 MG Take 1 tablet by mouth 2 (two) times daily. 180 tablet 3   Semaglutide,0.25 or 0.5MG /DOS, (OZEMPIC, 0.25 OR 0.5 MG/DOSE,) 2 MG/3ML SOPN Inject 0.5 mg into the skin once a week. 3 mL 1   simethicone (MYLICON) 680 MG chewable tablet Chew 125 mg by mouth every 6 (six) hours as needed for flatulence.     torsemide (DEMADEX) 20 MG  tablet Take 1 tablet (20 mg total) by mouth 2 (two) times daily. 180 tablet 3   No current facility-administered medications on file prior to visit.   Comprehensive ROS Pertinent positive and negative noted in HPI   Objective:   Vitals:   10/26/22 0858  BP: 110/73  Pulse: 66  Resp: 16  SpO2: 99%  Weight: 215 lb 9.6 oz (97.8 kg)  Height: 6' (1.829 m)    Exam General appearance : Awake, alert, not in any distress. Speech Clear. Not toxic looking HEENT: Atraumatic and Normocephalic, pupils equally reactive to light and accomodation Neck: Supple, no JVD. No cervical lymphadenopathy.  Chest: Good air entry bilaterally, no added sounds  CVS: S1 S2 regular, no murmurs.  Abdomen: Bowel sounds present, Non tender and not distended with no gaurding, rigidity or rebound. Extremities: B/L Lower Ext shows no edema, both legs are warm to touch Neurology: Awake alert, and oriented X 3,  Non focal Skin: No Rash  Data Review Lab Results  Component Value Date   HGBA1C 6.2 06/26/2022   HGBA1C 7.0 03/09/2022   HGBA1C 7.6 (A) 01/01/2022    Assessment & Plan  Daniel [name] was seen today for diabetes and hypertension.  Diagnoses and all orders for this visit:  Healthcare maintenance 2/2 Encounter for screening for HCV -  Cancel: HIV Antibody (routine testing w rflx) -     HCV Ab w Reflex to Quant PCR  Type 2 diabetes mellitus with complication, with long-term current use of insulin ([place])  educated on lifestyle modifications, including but not limited to diet choices and adding exercise to daily routine.   Last A1C-6.2  - CBC - Microalbumin / creatinine urine ratio Defer until f/u foot exam -     Aneurysm of ascending aorta without rupture (HCC) -     CT ANGIO CHEST AORTA W/CM & OR WO/CM- to be reordered by cardiology   Encounter for HCV screening test for low risk patient -     HCV Ab w Reflex to Quant PCR  Patient have been counseled extensively about nutrition and exercise. Other  issues discussed during this visit include: low cholesterol diet, weight control and daily exercise, foot care, annual eye examinations at Ophthalmology, importance of adherence with medications and regular follow-up. We also discussed long term complications of uncontrolled diabetes and hypertension.   No follow-ups on file.  The patient was given clear instructions to go to ER or return to medical center if symptoms don't improve, worsen or new problems develop. The patient verbalized understanding. The patient was told to call to get lab results if they haven't heard anything in the next week.   This note has been created with Surveyor, quantity. Any transcriptional errors are unintentional.   Kerin Perna, NP 10/26/2022, 1:13 PM

## 2022-10-27 LAB — LIPID PANEL
Chol/HDL Ratio: 1.9 ratio (ref 0.0–5.0)
Cholesterol, Total: 108 mg/dL (ref 100–199)
HDL: 56 mg/dL (ref >39)
LDL Chol Calc (NIH): 32 mg/dL (ref 0–99)
Triglycerides: 112 mg/dL (ref 0–149)
VLDL Cholesterol Cal: 20 mg/dL (ref 5–40)

## 2022-10-29 LAB — CBC
Hematocrit: 44.7 % (ref 37.5–51.0)
Hemoglobin: 14.9 g/dL (ref 13.0–17.7)
MCH: 29.6 pg (ref 26.6–33.0)
MCHC: 33.3 g/dL (ref 31.5–35.7)
MCV: 89 fL (ref 79–97)
Platelets: 173 10*3/uL (ref 150–450)
RBC: 5.03 x10E6/uL (ref 4.14–5.80)
RDW: 13.2 % (ref 11.6–15.4)
WBC: 5.3 10*3/uL (ref 3.4–10.8)

## 2022-10-29 LAB — MICROALBUMIN / CREATININE URINE RATIO
Creatinine, Urine: 64.2 mg/dL
Microalb/Creat Ratio: <5 mg/g creat (ref 0–29)
Microalbumin, Urine: <3 ug/mL

## 2022-10-29 LAB — HCV AB W REFLEX TO QUANT PCR: HCV Ab: NONREACTIVE

## 2022-10-29 LAB — HCV INTERPRETATION

## 2022-10-30 ENCOUNTER — Encounter (INDEPENDENT_AMBULATORY_CARE_PROVIDER_SITE_OTHER): Payer: Self-pay

## 2022-10-31 ENCOUNTER — Other Ambulatory Visit: Payer: Self-pay

## 2022-11-27 ENCOUNTER — Other Ambulatory Visit: Payer: Self-pay | Admitting: Student

## 2022-11-27 ENCOUNTER — Other Ambulatory Visit: Payer: Self-pay

## 2022-11-27 ENCOUNTER — Other Ambulatory Visit: Payer: Self-pay | Admitting: Cardiology

## 2022-11-27 DIAGNOSIS — I5020 Unspecified systolic (congestive) heart failure: Secondary | ICD-10-CM

## 2022-11-27 DIAGNOSIS — I472 Ventricular tachycardia, unspecified: Secondary | ICD-10-CM

## 2022-11-27 MED ORDER — AMIODARONE HCL 200 MG PO TABS
200.0000 mg | ORAL_TABLET | Freq: Every day | ORAL | 3 refills | Status: DC
  Filled 2022-11-27: qty 90, 90d supply, fill #0
  Filled 2023-03-08: qty 90, 90d supply, fill #1
  Filled 2023-06-21: qty 90, 90d supply, fill #2
  Filled 2023-09-25: qty 90, 90d supply, fill #3

## 2022-11-27 MED ORDER — TORSEMIDE 20 MG PO TABS
20.0000 mg | ORAL_TABLET | Freq: Two times a day (BID) | ORAL | 3 refills | Status: DC
  Filled 2022-11-27: qty 60, 30d supply, fill #0
  Filled 2023-01-08: qty 60, 30d supply, fill #1
  Filled 2023-02-13: qty 60, 30d supply, fill #2
  Filled 2023-03-08: qty 60, 30d supply, fill #3
  Filled 2023-04-24: qty 60, 30d supply, fill #4
  Filled 2023-05-27: qty 60, 30d supply, fill #5
  Filled 2023-07-03: qty 60, 30d supply, fill #6
  Filled 2023-08-07: qty 60, 30d supply, fill #7
  Filled 2023-09-16: qty 60, 30d supply, fill #8
  Filled 2023-10-25: qty 60, 30d supply, fill #9

## 2022-11-27 MED ORDER — METOPROLOL SUCCINATE ER 25 MG PO TB24
25.0000 mg | ORAL_TABLET | Freq: Every day | ORAL | 3 refills | Status: DC
  Filled 2022-11-27: qty 90, 90d supply, fill #0
  Filled 2023-03-08: qty 90, 90d supply, fill #1
  Filled 2023-06-24: qty 90, 90d supply, fill #2
  Filled 2023-10-11: qty 90, 90d supply, fill #3

## 2022-11-28 ENCOUNTER — Other Ambulatory Visit: Payer: Self-pay | Admitting: Family Medicine

## 2022-11-28 ENCOUNTER — Other Ambulatory Visit: Payer: Self-pay

## 2022-11-28 DIAGNOSIS — Z794 Long term (current) use of insulin: Secondary | ICD-10-CM

## 2022-11-28 MED ORDER — OZEMPIC (0.25 OR 0.5 MG/DOSE) 2 MG/3ML ~~LOC~~ SOPN
0.5000 mg | PEN_INJECTOR | SUBCUTANEOUS | 1 refills | Status: DC
  Filled 2022-11-28: qty 3, 28d supply, fill #0
  Filled 2023-03-01: qty 3, 28d supply, fill #1

## 2022-12-03 ENCOUNTER — Other Ambulatory Visit: Payer: Self-pay

## 2022-12-04 ENCOUNTER — Other Ambulatory Visit: Payer: Self-pay

## 2022-12-05 ENCOUNTER — Other Ambulatory Visit: Payer: Self-pay

## 2022-12-11 ENCOUNTER — Other Ambulatory Visit: Payer: Self-pay

## 2022-12-11 ENCOUNTER — Other Ambulatory Visit: Payer: Self-pay | Admitting: Cardiology

## 2022-12-11 DIAGNOSIS — I5020 Unspecified systolic (congestive) heart failure: Secondary | ICD-10-CM

## 2022-12-11 MED ORDER — ENTRESTO 24-26 MG PO TABS
1.0000 | ORAL_TABLET | Freq: Two times a day (BID) | ORAL | 3 refills | Status: DC
  Filled 2022-12-11: qty 60, 30d supply, fill #0
  Filled 2022-12-13: qty 180, 90d supply, fill #0
  Filled 2022-12-13: qty 60, 30d supply, fill #0

## 2022-12-12 ENCOUNTER — Other Ambulatory Visit: Payer: Self-pay

## 2022-12-13 ENCOUNTER — Other Ambulatory Visit: Payer: Self-pay

## 2022-12-14 ENCOUNTER — Other Ambulatory Visit: Payer: Self-pay

## 2022-12-16 NOTE — Progress Notes (Unsigned)
Cardiology Office Note Date:  08/05/2022  Patient ID:  Daniel [name], Daniel [name] July 06, 1956, MRN [id number] PCP:  Kerin Perna, NP  Cardiologist:  Dr. Virgina Jock Electrophysiologist: Dr. Quentin Ore     Chief Complaint:   67y.o. History of Present Illness: Daniel [name] a 67y.o. 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 Labs with + HS Trop and hypokalemia, continued with NSVTs, runs of VT/VF Treated with amio and lidocaine gtts Cath with no obstructive CAD DanielMRI noted an ejection fraction of 35%. Diffuse subendocardial/mid myocardial uptake not in a coronary distribution.  There is also elevated ECV and T2 suggestive of myocarditis.  The RV is normal EP, Dr. LQuentin Oreconsulted Eventually off gtts, transitioned to oral amiodarone and mexiletine Recommended life vest and ongoing management of his myocarditis and GDMT to attending cardiology team Discharged 10/05/22  He saw C. Cantwell, PA-C 02/14/22, difficulty with GDMT 2/2 financial constraints, DanielMRI and TTE with improved LVEF and life vest was d/c'd.  Med refills sent in for him  He saw Dr. LQuentin Ore5/23/23, doing well, meds continued planned for EP APP in a few months with surveillance labs.  If remained stable, without symptoms, could consider stopping mexiletine.  I saw him 06/01/22 He has done really well He will often miss the Semaglutide dose, just forgets. TID mexiletine is hard, will miss the middle dose, often, but not all the time. The rest of his meds he does well with. No CP, palpitations or cardiac awareness No SOB No near syncope or syncope. He will get momentarily lightheaded upon standing but no dizziness otherwise. Discussed importance of medication compliance His mexiletine was stopped and planned for 3 mo f/u Amio labs were done and ok  I saw him 08/07/22 He feels well No  palpitations or cardiac awareness No dizzy spells, near syncope or syncope. No SOB EKG noted PVCs, pt was unaware In d/w Dr. LQuentin Orestarted back on mexiletine, and recheck in several weeks, if continued PVCs could consider ablation  He saw Dr. PVirgina Jock11/10/23, EKG with PVCs noted this visit as well.  Planned for surveillance CT on his TAA and had EP follow up in place  He saw S. Riddle, NP 09/18/22, feeling well, no exertional intolerances, symptoms, EKG w/PVCs, planned for monitor.  Monitor noted 12.4% PVC burden (3 morphologies),  103 NSVTs, longest 8 beats, SR, no AF His mexiletine increased to TID.  TODAY He continues to feel well Tolerating TID mexiletine, misses the mid-day dose, but not frequently Infrequent palpitations/awareness of his heart beat, no SOB No near syncope or syncope.    Past Medical History:  Diagnosis Date   Asthma    Diabetes mellitus without complication ([place]    Hypertension     Past Surgical History:  Procedure Laterality Date   LEFT HEART CATH AND CORONARY ANGIOGRAPHY N/A 10/02/2021   Procedure: LEFT HEART CATH AND CORONARY ANGIOGRAPHY;  Surgeon: PNigel Mormon MD;  [facility];  Service: Cardiovascular;  Laterality: N/A;    Current Outpatient Medications  Medication Sig Dispense Refill   amiodarone (PACERONE) 200 MG tablet Take 1 tablet (200 mg total) by mouth daily. 90 tablet 3   aspirin 81 MG EC tablet Take 1 tablet (81 mg total) by mouth daily. 90 tablet 3   empagliflozin (JARDIANCE) 25 MG TABS tablet Take 1 tablet (25 mg total)  by mouth daily before breakfast. 30 tablet 3   insulin aspart protamine - aspart (NOVOLOG MIX 70/30 FLEXPEN) (70-30) 100 UNIT/ML FlexPen Inject 20 Units into the skin daily with breakfast. 3 mL 4   Insulin Pen Needle 32G X 4 MM MISC Use to inject insulin twice daily. 100 each 2   metoprolol succinate (TOPROL-XL) 25 MG 24 hr tablet Take 1 tablet (25 mg total) by mouth daily. 90 tablet 3    Multiple Vitamin (MULTIVITAMIN WITH MINERALS) TABS tablet Take 1 tablet by mouth daily.     rosuvastatin (CRESTOR) 20 MG tablet Take 1 tablet (20 mg total) by mouth daily. 90 tablet 3   sacubitril-valsartan (ENTRESTO) 24-26 MG Take 1 tablet by mouth 2 (two) times daily. 180 tablet 3   Semaglutide,0.25 or 0.'5MG'$ /DOS, (OZEMPIC, 0.25 OR 0.5 MG/DOSE,) 2 MG/3ML SOPN Inject 0.5 mg into the skin once a week. 3 mL 0   simethicone (MYLICON) 0000000 MG chewable tablet Chew 125 mg by mouth every 6 (six) hours as needed for flatulence.     torsemide (DEMADEX) 20 MG tablet Take 1 tablet (20 mg total) by mouth 2 (two) times daily. 180 tablet 3   No current facility-administered medications for this visit.    Allergies:   Metformin and related   Social History:  The patient  reports that he has never smoked. He has never been exposed to tobacco smoke. He has never used smokeless tobacco. He reports that he does not drink alcohol and does not use drugs.   Family History:  The patient's family history includes Cancer in his mother and paternal aunt; Heart attack in his father; Heart disease (age of onset: 79) in his father.  ROS:  Please see the history of present illness.    All other systems are reviewed and otherwise negative.   PHYSICAL EXAM:  VS:  There were no vitals taken for this visit. BMI: There is no height or weight on file to calculate BMI. Well nourished, well developed, in no acute distress HEENT: normocephalic, atraumatic Neck: no JVD, carotid bruits or masses Cardiac:  RRR;  1-2 extrasystoles, no significant murmurs, no rubs, or gallops Lungs:  CTA b/l, no wheezing, rhonchi or rales Abd: soft, nontender MS: no deformity or atrophy Ext: no edema Skin: warm and dry, no rash Neuro:  No gross deficits appreciated Psych: euthymic mood, full affect   EKG:  done today and reviewed by myself SR, RBBB, LAD, 1st degree AVblock 223m, one PVC  Dec 2023, monitor HR 56 - 174 bpm, average 66  bpm. 103 nonsustained VT episodes, longest 8 beats. Rare supraventricular ectopy. Frequent ventricular ectopy, 12.4%. At least 3 morphologies of PVCs. No sustained arrhythmias. No atrial fibrillation.  Cardiac MRI 01/26/2022: 1. Mild LVE with global hypokinesis and abnormal septal motion EF 42% this is improved from MRI 10/03/21 when it was 36% 2. Mid myocardial gadolinium uptake in antero-septum and inferior wall The degree of overall gadolinium uptake is less than seen on prior scan 10/03/21 3. Normal T2 with mildly elevated ECV/T1 suggest more chronic phase to myocarditis 4.  Dilated ascending thoracic aorta 4.1 cm   PCV ECHOCARDIOGRAM COMPLETE 12/22/2021 Left ventricle cavity is normal in size. Mild concentric hypertrophy of the left ventricle. Normal LV systolic function with EF 54%. Normal global wall motion. Doppler evidence of grade I (impaired) diastolic dysfunction, normal LAP. 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 Mildly dilated ascending aorta at 4.3 cm. Left atrial cavity is mildly dilated. Mild tricuspid  regurgitation. No evidence of pulmonary hypertension. Compared to previous study on 10/03/2021, LVEF has improved from 35%. Wall motion abnormalities have resolved.  10/03/2021: LHC LM: Normal LAD: No significant prox disease          Vessel tapers very rapidly after Diag 2          Mid to apical LAD is small caliber with 70% mid and 80% dital diffuse diseas          Diag 2 prox 60% disease Lcx: Mid focal 20% stenosis RCA: Very large, dominant vessel           PDA/PLA relatively smaller caliber after dRCA bifurcation          PDA 50% disease   Had recurrent VT in lab requiring defib.    cMRI 10/03/2021 shows: 1. Moderate LVE with mid/apical anterior wall, septal true apex and inferior apical wall motion. Quantitative EF 36%. 2. Diffuse sub endocardial and mid myocardial uptake particularly in the septum and mid/basal anterior wall not in coronary distribution 3.  Elevated ECV and T2 along with gadolinium uptake suggests myocarditis 4.  Normal RV size and function RVEF 56% no evidence of RV dysplasia 5.  Small pericardial effusion 6.  Dilated aortic root 4.0 cm   Echocardiogram 10/03/2021: 1. Moderate LVE with mid/apical anterior wall, septal true apex and inferior apical wall motion. Quantitative EF 36%. 2. Diffuse sub endocardial and mid myocardial uptake particularly in the septum and mid/basal anterior wall not in coronary distribution 3. Elevated ECV and T2 along with gadolinium uptake suggests myocarditis 4.  Normal RV size and function RVEF 56% no evidence of RV dysplasia 5.  Small pericardial effusion 6.  Dilated aortic root 4.0 cm   Recent Labs: 10/02/2021: B Natriuretic Peptide 216.6 10/03/2021: Magnesium 2.3 11/26/2021: Hemoglobin 14.6; Platelets 159 06/01/2022: ALT 28; BUN 12; Creatinine, Ser 1.07; Potassium 4.2; Sodium 143; TSH 1.370  10/02/2021: Cholesterol 180; HDL 48; LDL Cholesterol 94; Total CHOL/HDL Ratio 3.8; Triglycerides 189; VLDL 38   CrCl cannot be calculated (Patient's most recent lab result is older than the maximum 21 days allowed.).   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  The patient did not have any concerns regarding medicines.  Venetia Night, PA-C 08/05/2022 4:51 PM      [address] [place] [address] [phone] (office)  [phone] (fax)

## 2022-12-17 ENCOUNTER — Other Ambulatory Visit: Payer: Self-pay

## 2022-12-18 ENCOUNTER — Encounter: Payer: Self-pay | Admitting: Physician Assistant

## 2022-12-18 ENCOUNTER — Ambulatory Visit: Payer: Self-pay | Attending: Physician Assistant | Admitting: Physician Assistant

## 2022-12-18 ENCOUNTER — Other Ambulatory Visit: Payer: Self-pay

## 2022-12-18 VITALS — BP 100/76 | HR 64 | Ht 72.0 in | Wt 227.8 lb

## 2022-12-18 DIAGNOSIS — I5020 Unspecified systolic (congestive) heart failure: Secondary | ICD-10-CM

## 2022-12-18 DIAGNOSIS — I472 Ventricular tachycardia, unspecified: Secondary | ICD-10-CM

## 2022-12-18 DIAGNOSIS — I493 Ventricular premature depolarization: Secondary | ICD-10-CM

## 2022-12-18 DIAGNOSIS — I428 Other cardiomyopathies: Secondary | ICD-10-CM

## 2022-12-18 MED ORDER — ENTRESTO 24-26 MG PO TABS: 1.0000 | ORAL_TABLET | Freq: Two times a day (BID) | ORAL | 3 refills | Status: DC

## 2022-12-18 NOTE — Patient Instructions (Signed)
Medication Instructions:   Your physician recommends that you continue on your current medications as directed. Please refer to the Current Medication list given to you today.   *If you need a refill on your cardiac medications before your next appointment, please call your pharmacy*   Lab Work: [place]    If you have labs (blood work) drawn today and your tests are completely normal, you will receive your results only by: Altha (if you have MyChart) OR A paper copy in the mail If you have any lab test that is abnormal or we need to change your treatment, we will call you to review the results.   Testing/Procedures: NONE ORDERED  TODAY      Follow-Up: At [facility], you and your health needs are our priority.  As part of our continuing mission to provide you with exceptional heart care, we have created designated Provider Care Teams.  These Care Teams include your primary Cardiologist (physician) and Advanced Practice Providers (APPs -  Physician Assistants and Nurse Practitioners) who all work together to provide you with the care you need, when you need it.  We recommend signing up for the patient portal called "MyChart".  Sign up information is provided on this After Visit Summary.  MyChart is used to connect with patients for Virtual Visits (Telemedicine).  Patients are able to view lab/test results, encounter notes, upcoming appointments, etc.  Non-urgent messages can be sent to your provider as well.   To learn more about what you can do with MyChart, go to [email or web address].    Your next appointment:   2-3  month(s)  Provider:   You may see Vickie Epley, MD or one of the following Advanced Practice Providers on your designated Care Team:   Tommye Standard, [place]   Other Instructions

## 2022-12-19 ENCOUNTER — Telehealth: Payer: Self-pay

## 2022-12-19 NOTE — Telephone Encounter (Signed)
faxed patient assistance for Enresto 49/51 mg on 12/19/2022. LM

## 2022-12-21 ENCOUNTER — Other Ambulatory Visit: Payer: Self-pay

## 2022-12-24 ENCOUNTER — Other Ambulatory Visit: Payer: Self-pay

## 2022-12-28 ENCOUNTER — Other Ambulatory Visit: Payer: Self-pay

## 2023-01-08 ENCOUNTER — Other Ambulatory Visit: Payer: Self-pay

## 2023-01-11 ENCOUNTER — Other Ambulatory Visit: Payer: Self-pay

## 2023-01-23 ENCOUNTER — Other Ambulatory Visit: Payer: Self-pay

## 2023-01-28 ENCOUNTER — Ambulatory Visit (INDEPENDENT_AMBULATORY_CARE_PROVIDER_SITE_OTHER): Payer: Medicaid Other | Admitting: Primary Care

## 2023-02-03 ENCOUNTER — Other Ambulatory Visit: Payer: Self-pay

## 2023-02-03 ENCOUNTER — Encounter (HOSPITAL_COMMUNITY): Payer: Self-pay

## 2023-02-03 ENCOUNTER — Emergency Department (HOSPITAL_COMMUNITY)
Admission: EM | Admit: 2023-02-03 | Discharge: 2023-02-03 | Disposition: A | Discharge status: Home / Self Care | Payer: Medicare Other | Attending: Emergency Medicine | Admitting: Emergency Medicine

## 2023-02-03 DIAGNOSIS — J45909 Unspecified asthma, uncomplicated: Secondary | ICD-10-CM | POA: Diagnosis not present

## 2023-02-03 DIAGNOSIS — I10 Essential (primary) hypertension: Secondary | ICD-10-CM | POA: Diagnosis not present

## 2023-02-03 DIAGNOSIS — R040 Epistaxis: Secondary | ICD-10-CM | POA: Diagnosis present

## 2023-02-03 DIAGNOSIS — Z794 Long term (current) use of insulin: Secondary | ICD-10-CM | POA: Diagnosis not present

## 2023-02-03 DIAGNOSIS — Z79899 Other long term (current) drug therapy: Secondary | ICD-10-CM | POA: Diagnosis not present

## 2023-02-03 DIAGNOSIS — Z7984 Long term (current) use of oral hypoglycemic drugs: Secondary | ICD-10-CM | POA: Diagnosis not present

## 2023-02-03 DIAGNOSIS — E119 Type 2 diabetes mellitus without complications: Secondary | ICD-10-CM | POA: Diagnosis not present

## 2023-02-03 DIAGNOSIS — R067 Sneezing: Secondary | ICD-10-CM | POA: Diagnosis not present

## 2023-02-03 DIAGNOSIS — Z7982 Long term (current) use of aspirin: Secondary | ICD-10-CM | POA: Insufficient documentation

## 2023-02-03 HISTORY — DX: Myocarditis, unspecified: I51.4

## 2023-02-03 MED ORDER — LIDOCAINE-EPINEPHRINE (PF) 2 %-1:200000 IJ SOLN: 10.0000 mL | Freq: Once | INTRAMUSCULAR | Status: AC

## 2023-02-03 MED ORDER — LIDOCAINE-EPINEPHRINE (PF) 2 %-1:200000 IJ SOLN
INTRAMUSCULAR | Status: AC
  Administered 2023-02-03: 10 mL via INTRADERMAL
  Filled 2023-02-03: qty 20

## 2023-02-03 NOTE — ED Provider Notes (Signed)
[place] EMERGENCY DEPARTMENT AT [facility] Provider Note   CSN: [id number] Arrival date & time: 02/03/23  0725     History Chief Complaint  Patient presents with   Epistaxis    Daniel [name] is a 67 y.o. male with history of asthma, diabetes, hypertension presents the emergency room today for evaluation of left nostril bleeding.  Patient reports that it started bleeding around 9 last night and then stopped.  He woke up this morning and had bleeding in his left nostril again.  He reports he had a similar thing happened him 2 years ago and he had used silver nitrate to stop it.  He denies any blood thinner use.  Denies any trauma to the area.  He reports may have a little bit of a runny nose but denies any nasal congestion.  Has been sneezing lately. Allergic to metformin. Denies any intranasal drug use.    Epistaxis Associated symptoms: sneezing   Associated symptoms: no fever        Home Medications Prior to Admission medications   Medication Sig Start Date End Date Taking? Authorizing Provider  amiodarone (PACERONE) 200 MG tablet Take 1 tablet (200 mg total) by mouth daily. 11/27/22   [name], NP  aspirin 81 MG EC tablet Take 1 tablet (81 mg total) by mouth daily. 10/13/21   Patwardhan, Anabel Bene, MD  empagliflozin (JARDIANCE) 25 MG TABS tablet Take 1 tablet (25 mg total) by mouth daily before breakfast. 10/26/22   Grayce Sessions, NP  insulin aspart protamine - aspart (NOVOLOG MIX 70/30 FLEXPEN) (70-30) 100 UNIT/ML FlexPen Inject 20 Units into the skin daily with breakfast. 10/26/22   Grayce Sessions, NP  Insulin Pen Needle 32G X 4 MM MISC Use to inject insulin twice daily. 10/26/22   Grayce Sessions, NP  metoprolol succinate (TOPROL-XL) 25 MG 24 hr tablet Take 1 tablet (25 mg total) by mouth daily. 11/27/22   [name], NP  mexiletine (MEXITIL) 150 MG capsule Take 1 capsule (150 mg total) by mouth 3 (three) times daily. 10/12/22   [name], NP   Multiple Vitamin (MULTIVITAMIN WITH MINERALS) TABS tablet Take 1 tablet by mouth daily.    [provider]  Omega-3 Fatty Acids (FISH OIL PO) Take by mouth. daily    [provider]  rosuvastatin (CRESTOR) 20 MG tablet Take 1 tablet (20 mg total) by mouth daily. 09/18/22   [name], NP  sacubitril-valsartan (ENTRESTO) 24-26 MG Take 1 tablet by mouth 2 (two) times daily. 12/18/22   Tolia, Sunit, DO  Semaglutide,0.25 or 0.5MG /DOS, (OZEMPIC, 0.25 OR 0.5 MG/DOSE,) 2 MG/3ML SOPN Inject 0.5 mg into the skin once a week. 11/28/22   Hoy Register, MD  simethicone (MYLICON) 125 MG chewable tablet Chew 125 mg by mouth every 6 (six) hours as needed for flatulence.    [provider]  torsemide (DEMADEX) 20 MG tablet Take 1 tablet (20 mg total) by mouth 2 (two) times daily. 11/27/22   Patwardhan, Anabel Bene, MD      Allergies    Metformin and related    Review of Systems   Review of Systems  Constitutional:  Negative for chills and fever.  HENT:  Positive for nosebleeds and sneezing.   Neurological:  Negative for light-headedness.    Physical Exam Updated Vital Signs BP (!) 157/89   Pulse 64   Temp 97.6 F (36.4 C) (Oral)   Resp 16   Ht 6' (1.829 m)   [employer]  103.3 kg   SpO2 100%   BMI 30.89 kg/m  Physical Exam Vitals and nursing note reviewed.  Constitutional:      General: He is not in acute distress.    Appearance: Normal appearance. He is not toxic-appearing.  HENT:     Nose:     Comments: Slight rhinorrhea seen in the right nare. Some blood seen more anteriorly in the nare, I do not seen any bleeding or irritation to the more posterior aspect of the anterior nasal cavity. Small trickle of blood, but no brisk bleeding. No significant swelling.  Eyes:     General: No scleral icterus. Pulmonary:     Effort: Pulmonary effort is normal. No respiratory distress.  Skin:    General: Skin is dry.  Neurological:     General: No focal deficit present.      Mental Status: He is alert. Mental status is at baseline.     ED Results / Procedures / Treatments   Labs (all labs ordered are listed, but only abnormal results are displayed) Labs Reviewed - No data to display  EKG None  Radiology No results found.  Procedures Procedures    Medications Ordered in ED Medications - No data to display  ED Course/ Medical Decision Making/ A&P Clinical Course as of 02/03/23 0949  Sun Feb 03, 2023  0918 Packing removed. Bleeding has stopped. Timer set for an hour to re-evaluate nostril for bleeding. Patient aware to let nursing know if it starts bleeding again.  [RR]    Clinical Course User Index [RR] Achille Rich, PA-C   Medical Decision Making Risk Prescription drug management.    67 y.o. male presents to the ER today for evaluation of epistaxis. Differential diagnosis includes but is not limited to hemophilia, AVM, epistaxis from dry mucous membranes, coagulopathy disorders. Vital signs show mildly elevated blood pressure otherwise unremarkable. Physical exam as noted above.   Small amount of blood noted in the more anterior aspect of the anterior chamber of the nose, none seen in the more posterior aspect of the anterior chamber.  No brisk bleeding seen.  Nasal gauze packing ordered.  Will re-evaluate after 5 to 10 minutes. At this time, I do not think this a posterior nose bleed.   Bleeding is seen to the more superior aspect of the anterior chamber of the nose.  Only a very small trickle seen.  Packed the nose with lidocaine with epinephrine immersed gauze and left for 30 minutes.  Will reevaluate.  Packing removed, no bleeding appreciated.  Primary sat for an hour to reevaluate the patient.  Encouraged him to let nursing staff know if the bleeding starts again.  10:33 AM I reevaluated the nare, no bleeding noted.  Patient reports that he does not have any taste of blood in his mouth.  There is no blood seen in the posterior oropharynx.   No bleeding noted from the nare.  Patient reports he feels better.  Only small amount of blood noted on the paper towel when he first initially came in with a very small trickle of blood.  I do not think any need for CBC needs performed.  He does not have any history of coagulopathy disorders.  He is not on any blood thinners. Given that this is his second occurrence of this nosebleed in the same nostril, will refer to him to an ear nose and throat specialist to follow-up with.  Recommended pinching his nose leaning forward and trying Afrin.  Recommended following  with ENT specialist. We discussed plan at bedside. We discussed strict return precautions and red flag symptoms. The patient verbalized their understanding and agrees to the plan. The patient is stable and being discharged home in good condition.  I discussed this case with my attending physician who cosigned this note including patient's presenting symptoms, physical exam, and planned diagnostics and interventions. Attending physician stated agreement with plan or made changes to plan which were implemented.   Attending physician assessed patient at bedside.  Portions of this report may have been transcribed using voice recognition software. Every effort was made to ensure accuracy; however, inadvertent computerized transcription errors may be present.    Final Clinical Impression(s) / ED Diagnoses Final diagnoses:  Epistaxis    Rx / DC Orders ED Discharge Orders     None         Achille Rich, Cordelia Poche 02/03/23 1036    Eber Hong, MD 02/05/23 [phone]

## 2023-02-03 NOTE — ED Triage Notes (Addendum)
Pt states the epistaxis of left nostril started last night, but he was able to get it to stop. Pt states the epistaxis started again about an hour again and he can't get it to stop. Pt denies blood thinners. Pt denies lightheadedness, dizziness and weakness. Bleeding is controlled

## 2023-02-03 NOTE — Discharge Instructions (Signed)
Please call interesting that specialist was on the discharge paperwork for follow-up.  He can try Afrin or direct pressure to help with your nosebleed.  If you have recurrence or worsening bleeding or any lightheadedness, please return to the nearest emergency room for evaluation.  If you have any concerns, new or worsening symptoms, please return to the nearest emergency room for evaluation.  Contact a doctor if: You have a fever. You get nosebleeds often. You get nosebleeds more often than usual. You bruise very easily. You have something stuck in your nose. You are bleeding in your mouth. You vomit or cough up brown material. You get a nosebleed after you start a new medicine. Get help right away if: You have a nosebleed after you fall or hurt your head. Your nosebleed does not go away after 20 minutes. You feel dizzy or weak. You have unusual bleeding from other parts of your body. You have unusual bruising on other parts of your body. You get sweaty. You vomit blood.

## 2023-02-13 ENCOUNTER — Other Ambulatory Visit: Payer: Self-pay

## 2023-03-01 ENCOUNTER — Other Ambulatory Visit: Payer: Self-pay

## 2023-03-08 ENCOUNTER — Other Ambulatory Visit: Payer: Self-pay

## 2023-03-11 ENCOUNTER — Ambulatory Visit (INDEPENDENT_AMBULATORY_CARE_PROVIDER_SITE_OTHER): Payer: Medicaid Other | Admitting: Primary Care

## 2023-03-22 ENCOUNTER — Other Ambulatory Visit: Payer: Self-pay

## 2023-03-25 ENCOUNTER — Telehealth: Payer: Self-pay

## 2023-03-25 ENCOUNTER — Other Ambulatory Visit: Payer: Self-pay

## 2023-03-25 NOTE — Telephone Encounter (Signed)
Called patient today and was unable to leave a message due to his voicemail box not being setup 1:45pm. Patient is due for a refill of his Jardiance 25mg  via the [employer] patient assistance program but I was unable to verify shipment on his behalf. They have his street number as [phone] not [phone] because they were unable to read what was written on the application. Per [facility], patient will have to call and update shipment address and request a refill. If needed, patient's rx # with [employer] is [id number]. The number for patient to call to update address is [phone].

## 2023-03-27 ENCOUNTER — Other Ambulatory Visit: Payer: Self-pay

## 2023-03-28 ENCOUNTER — Other Ambulatory Visit: Payer: Self-pay

## 2023-03-29 ENCOUNTER — Other Ambulatory Visit: Payer: Self-pay

## 2023-03-29 ENCOUNTER — Encounter: Payer: Self-pay | Admitting: Cardiology

## 2023-03-29 ENCOUNTER — Ambulatory Visit: Payer: Medicare Other | Attending: Cardiology | Admitting: Cardiology

## 2023-03-29 VITALS — BP 112/78 | HR 69 | Ht 72.0 in | Wt 234.4 lb

## 2023-03-29 DIAGNOSIS — I5020 Unspecified systolic (congestive) heart failure: Secondary | ICD-10-CM | POA: Diagnosis not present

## 2023-03-29 DIAGNOSIS — I472 Ventricular tachycardia, unspecified: Secondary | ICD-10-CM | POA: Diagnosis not present

## 2023-03-29 DIAGNOSIS — I428 Other cardiomyopathies: Secondary | ICD-10-CM | POA: Insufficient documentation

## 2023-03-29 DIAGNOSIS — I493 Ventricular premature depolarization: Secondary | ICD-10-CM

## 2023-03-29 MED ORDER — ENTRESTO 24-26 MG PO TABS
1.0000 | ORAL_TABLET | Freq: Two times a day (BID) | ORAL | 3 refills | Status: DC
Start: 2023-03-29 — End: 2023-12-19
  Filled 2023-03-29: qty 180, 90d supply, fill #0

## 2023-03-29 NOTE — Patient Instructions (Addendum)
Medication Instructions:  Your physician recommends that you continue on your current medications as directed. Please refer to the Current Medication list given to you today.  *If you need a refill on your cardiac medications before your next appointment, please call your pharmacy*  Lab Work: TODAY: CMET, TSH, T4  Testing/Procedures: Cardiac PET CT scan. See instructions below.  Follow-Up: At [facility], you and your health needs are our priority.  As part of our continuing mission to provide you with exceptional heart care, we have created designated Provider Care Teams.  These Care Teams include your primary Cardiologist (physician) and Advanced Practice Providers (APPs -  Physician Assistants and Nurse Practitioners) who all work together to provide you with the care you need, when you need it.  Your next appointment:   6 month(s)  Provider:   Steffanie Dunn, MD    You have been referred to the Advanced Heart Failure Clinic Other Instructions How to Prepare for Your Cardiac PET/CT Stress Test:  1. Please do not take these medications before your test:   Medications that may interfere with the cardiac pharmacological stress agent (ex. nitrates - including erectile dysfunction medications, isosorbide mononitrate, tamulosin or beta-blockers) the day of the exam. (Erectile dysfunction medication should be held for at least 72 hrs prior to test) Theophylline containing medications for 12 hours. Dipyridamole 48 hours prior to the test. Your remaining medications may be taken with water.  2. Nothing to eat or drink, except water, 3 hours prior to arrival time.   NO caffeine/decaffeinated products, or chocolate 12 hours prior to arrival.  3. NO perfume, cologne or lotion  4. Total time is 1 to 2 hours; you may want to bring reading material for the waiting time.  5. Please report to Radiology at the [facility] Main Entrance 30 minutes early for your test.  [address] [place], [place] [address]  Diabetic Preparation:  Hold oral medications. You may take NPH and Lantus insulin. Do not take Humalog or Humulin R (Regular Insulin) the day of your test. Check blood sugars prior to leaving the house. If able to eat breakfast prior to 3 hour fasting, you may take all medications, including your insulin, Do not worry if you miss your breakfast dose of insulin - start at your next meal.  IF YOU THINK YOU MAY BE PREGNANT, OR ARE NURSING PLEASE INFORM THE TECHNOLOGIST.  In preparation for your appointment, medication and supplies will be purchased.  Appointment availability is limited, so if you need to cancel or reschedule, please call the Radiology Department at [phone]  24 hours in advance to avoid a cancellation fee of $100.00  What to Expect After you Arrive:  Once you arrive and check in for your appointment, you will be taken to a preparation room within the Radiology Department.  A technologist or Nurse will obtain your medical history, verify that you are correctly prepped for the exam, and explain the procedure.  Afterwards,  an IV will be started in your arm and electrodes will be placed on your skin for EKG monitoring during the stress portion of the exam. Then you will be escorted to the PET/CT scanner.  There, staff will get you positioned on the scanner and obtain a blood pressure and EKG.  During the exam, you will continue to be connected to the EKG and blood pressure machines.  A small, safe amount of a radioactive tracer will be injected in your IV to obtain  a series of pictures of your heart along with an injection of a stress agent.    After your Exam:  It is recommended that you eat a meal and drink a caffeinated beverage to counter act any effects of the stress agent.  Drink plenty of fluids for the remainder of the day and urinate frequently for the first couple of hours after the exam.  Your doctor will inform you of your test  results within 7-10 business days.  For more information and frequently asked questions, please visit our website : [email or web address]  For questions about your test or how to prepare for your test, please call: Rockwell Alexandria, Cardiac Imaging Nurse Navigator  Larey Brick, Cardiac Imaging Nurse Navigator Office: [phone]

## 2023-03-29 NOTE — Progress Notes (Unsigned)
Electrophysiology Office Follow up Visit Note:    Date:  03/30/2023   ID:  Daniel [name], DOB October 17, 1955, MRN [id number]  PCP:  Grayce Sessions, NP  [name] Cardiologist:  None  [facility] Electrophysiologist:  Lanier Prude, MD    Interval History:    Daniel [name] is a 67 y.o. male who presents for a follow up visit.   Last seen in March by [name].  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  ICD was deferred by the patient and he has been managed with amiodarone and mexiletine.  He saw [name] in clinic in December 2023.  At that appointment he was having frequent PVCs and a heart monitor was ordered.  The monitor reported 12.4% PVC burden.       Past medical, surgical, social and family history were reviewed.  ROS:   Please see the history of present illness.    All other systems reviewed and are negative.  EKGs/Labs/Other Studies Reviewed:    The following studies were reviewed today:  April 2023 cardiac MRI EF 42% Mid myocardial LGE in the anterior septum and inferior wall.  March 2024 EKG reviewed and shows sinus rhythm, right bundle branch block, left anterior fascicular block   EKG Interpretation  Date/Time:  Friday March 29 2023 14:56:48 EDT Ventricular Rate:  67 PR Interval:  210 QRS Duration: 142 QT Interval:  506 QTC Calculation: 534 R Axis:   -86 Text Interpretation: Sinus rhythm with 1st degree A-V block with occasional Premature ventricular complexes Right bundle branch block Left anterior fascicular block  Bifascicular block  Confirmed by Steffanie Dunn [phone]) on 03/30/2023 9:30:17 AM     Physical Exam:    VS:  BP 112/78   Pulse 69   Ht 6' (1.829 m)   Wt 234 lb 6.4 oz (106.3 kg)   SpO2 94%   BMI 31.79 kg/m     Wt Readings from Last 3 Encounters:  03/29/23 234 lb 6.4 oz (106.3 kg)  02/03/23 227 lb 11.8 oz (103.3 kg)  12/18/22 227 lb 12.8 oz (103.3 kg)     GEN:  Well nourished,  well developed in no acute distress. Obese. CARDIAC: RRR, no murmurs, rubs, gallops RESPIRATORY:  Clear to auscultation without rales, wheezing or rhonchi       ASSESSMENT:    1. VT (ventricular tachycardia) (HCC)   2. PVC's (premature ventricular contractions)   3. HFrEF (heart failure with reduced ejection fraction) (HCC)   4. NICM (nonischemic cardiomyopathy) (HCC)    PLAN:    In order of problems listed above:  #Myocarditis #Ventricular tachycardia #High risk drug monitoring-amiodarone He had an improvement in the burden of his ventricular arrhythmias although his multifocal PVCs are still present on recent heart monitor. For now continue amiodarone and mexiletine. I am going to order a cardiac PET to look for active inflammation while we wait for heart failure evaluation. Heart failure referral was discussed at the time of my initial consult but it looks like this has not been completed.  I would like to get him plugged in with the advanced heart failure clinic given the diagnosis of myocarditis with a chronically reduced ejection fraction.  The question is whether additional diagnostics are indicated, immunosuppression? At this time, no indication for ICD.  His PVCs are pleimorphic on monitoring making catheter ablation a poor treatment strategy.   Needs repeat CMP, TSH and free T4 today.  #Chronic systolic heart failure #Nonischemic cardiomyopathy NYHA class  II.  Follows with Dr. Rosemary Holms. Heart failure referral as above Continue current medical therapy.   Follow-up 6 months with me or sooner based on imaging findings.   Signed, Steffanie Dunn, MD, [facility], [facility] 03/30/2023 9:31 AM    Electrophysiology [facility]

## 2023-04-02 ENCOUNTER — Ambulatory Visit (INDEPENDENT_AMBULATORY_CARE_PROVIDER_SITE_OTHER): Payer: Medicare Other | Admitting: Primary Care

## 2023-04-02 ENCOUNTER — Other Ambulatory Visit: Payer: Self-pay

## 2023-04-02 ENCOUNTER — Encounter (INDEPENDENT_AMBULATORY_CARE_PROVIDER_SITE_OTHER): Payer: Self-pay | Admitting: Primary Care

## 2023-04-02 VITALS — BP 117/65 | HR 37 | Resp 16 | Ht 72.0 in | Wt 237.8 lb

## 2023-04-02 DIAGNOSIS — Z794 Long term (current) use of insulin: Secondary | ICD-10-CM | POA: Diagnosis not present

## 2023-04-02 DIAGNOSIS — L602 Onychogryphosis: Secondary | ICD-10-CM

## 2023-04-02 DIAGNOSIS — E118 Type 2 diabetes mellitus with unspecified complications: Secondary | ICD-10-CM

## 2023-04-02 DIAGNOSIS — Z1211 Encounter for screening for malignant neoplasm of colon: Secondary | ICD-10-CM

## 2023-04-02 DIAGNOSIS — L918 Other hypertrophic disorders of the skin: Secondary | ICD-10-CM

## 2023-04-02 DIAGNOSIS — E119 Type 2 diabetes mellitus without complications: Secondary | ICD-10-CM

## 2023-04-02 DIAGNOSIS — Z7984 Long term (current) use of oral hypoglycemic drugs: Secondary | ICD-10-CM

## 2023-04-02 DIAGNOSIS — Z760 Encounter for issue of repeat prescription: Secondary | ICD-10-CM

## 2023-04-02 LAB — POCT GLYCOSYLATED HEMOGLOBIN (HGB A1C): HbA1c, POC (controlled diabetic range): 7.2 % — AB (ref 0.0–7.0)

## 2023-04-02 MED ORDER — OZEMPIC (0.25 OR 0.5 MG/DOSE) 2 MG/3ML ~~LOC~~ SOPN
0.5000 mg | PEN_INJECTOR | SUBCUTANEOUS | 1 refills | Status: DC
Start: 2023-04-02 — End: 2023-10-25
  Filled 2023-04-02 – 2023-04-12 (×2): qty 9, 84d supply, fill #0

## 2023-04-02 MED ORDER — EMPAGLIFLOZIN 25 MG PO TABS
25.0000 mg | ORAL_TABLET | Freq: Every day | ORAL | 3 refills | Status: DC
Start: 2023-04-02 — End: 2023-07-25
  Filled 2023-04-02: qty 90, 90d supply, fill #0

## 2023-04-02 MED ORDER — NOVOLOG MIX 70/30 FLEXPEN (70-30) 100 UNIT/ML ~~LOC~~ SUPN
20.0000 [IU] | PEN_INJECTOR | Freq: Every day | SUBCUTANEOUS | 4 refills | Status: DC
Start: 2023-04-02 — End: 2023-07-25
  Filled 2023-04-02 – 2023-04-12 (×2): qty 6, 30d supply, fill #0
  Filled 2023-07-03: qty 6, 30d supply, fill #1

## 2023-04-02 NOTE — Progress Notes (Signed)
[facility]  Daniel [name], is a 67 y.o. male  VQQ:[id number]  [id number]:[id number]  DOB - [birth year]  Chief Complaint  Patient presents with   Diabetes       Subjective:   Daniel [name] is a 67 y.o. male here today for a follow up visit on diabetes. Denies polyuria, polydipsia, polyphasia or vision changes.  Does not check blood sugars at home. Patient has No headache, No chest pain, No abdominal pain - No Nausea, No new weakness tingling or numbness, No Cough - shortness of breath. Requesting medication refill. C/o having Gas but has alkalizer at home that helps.  No problems updated.  Allergies  Allergen Reactions   Metformin And Related Nausea Only    Past Medical History:  Diagnosis Date   Asthma    Diabetes mellitus without complication (HCC)    Hypertension    Myocarditis (HCC)     Current Outpatient Medications on File Prior to Visit  Medication Sig Dispense Refill   amiodarone (PACERONE) 200 MG tablet Take 1 tablet (200 mg total) by mouth daily. 90 tablet 3   aspirin 81 MG EC tablet Take 1 tablet (81 mg total) by mouth daily. 90 tablet 3   Insulin Pen Needle 32G X 4 MM MISC Use to inject insulin twice daily. 100 each 2   metoprolol succinate (TOPROL-XL) 25 MG 24 hr tablet Take 1 tablet (25 mg total) by mouth daily. 90 tablet 3   mexiletine (MEXITIL) 150 MG capsule Take 1 capsule (150 mg total) by mouth 3 (three) times daily. 90 capsule 11   Multiple Vitamin (MULTIVITAMIN WITH MINERALS) TABS tablet Take 1 tablet by mouth daily.     Omega-3 Fatty Acids (FISH OIL PO) Take by mouth. daily     rosuvastatin (CRESTOR) 20 MG tablet Take 1 tablet (20 mg total) by mouth daily. 90 tablet 3   sacubitril-valsartan (ENTRESTO) 24-26 MG Take 1 tablet by mouth 2 (two) times daily. 180 tablet 3   simethicone (MYLICON) 125 MG chewable tablet Chew 125 mg by mouth every 6 (six) hours as needed for flatulence.     torsemide (DEMADEX) 20 MG tablet Take 1 tablet (20 mg total) by  mouth 2 (two) times daily. 180 tablet 3   No current facility-administered medications on file prior to visit.    Objective:   Vitals:   04/02/23 0915  BP: 117/65  Pulse: (Abnormal) 37  Resp: 16  SpO2: 97%  Weight: 237 lb 12.8 oz (107.9 kg)  Height: 6' (1.829 m)    Comprehensive ROS Pertinent positive and negative noted in HPI   Exam General appearance : Awake, alert, not in any distress. Speech Clear. Not toxic looking HEENT: Atraumatic and Normocephalic, pupils equally reactive to light and accomodation Neck: Supple, no JVD. No cervical lymphadenopathy.  Chest: Good air entry bilaterally, no added sounds  CVS: S1 S2 regular, no murmurs.  Abdomen: Bowel sounds present, Non tender and not distended with no gaurding, rigidity or rebound. Extremities: B/L Lower Ext shows no edema, both legs are warm to touch Neurology: Awake alert, and oriented X 3, CN II-XII intact, Non focal Skin: No Rash  Data Review Lab Results  Component Value Date   HGBA1C 7.2 (A) 04/02/2023   HGBA1C 6.2 06/26/2022   HGBA1C 7.0 03/09/2022    Assessment & Plan  Edker was seen today for diabetes.  Diagnoses and all orders for this visit:  Type 2 diabetes mellitus with complication, with long-term current use of insulin (HCC) -  POCT glycosylated hemoglobin (Hb A1C) -     POCT glycosylated hemoglobin (Hb A1C) 7.2  increase from 6.2 admits to eating to much sweets and not taking medication as prescribed  - educated on lifestyle modifications, including but not limited to diet choices and adding exercise to daily routine.   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 -     Semaglutide,0.25 or 0.5MG /DOS, (OZEMPIC, 0.25 OR 0.5 MG/DOSE,) 2 MG/3ML SOPN; Inject 0.5 mg into the skin once a week. -     empagliflozin (JARDIANCE) 25 MG TABS tablet; Take 1 tablet (25 mg total) by mouth daily before breakfast.    Patient have been counseled extensively about nutrition and exercise. Other issues discussed during this visit include: low cholesterol diet, weight control and daily exercise, foot care, annual eye examinations at Ophthalmology, importance of adherence with medications and regular follow-up. We also discussed long term complications of uncontrolled diabetes and hypertension.   Return in about 3 months (around 07/03/2023).  The patient was given clear instructions to go to ER or return to medical center if symptoms don't improve, worsen or new problems develop. The patient verbalized understanding. The patient was told to call to get lab results if they haven't heard anything in the next week.   This note has been created with Education officer, environmental. Any transcriptional errors are unintentional.   Grayce Sessions, NP 04/02/2023, 10:38 AM

## 2023-04-08 ENCOUNTER — Other Ambulatory Visit: Payer: Self-pay

## 2023-04-09 ENCOUNTER — Telehealth (INDEPENDENT_AMBULATORY_CARE_PROVIDER_SITE_OTHER): Payer: Self-pay | Admitting: Primary Care

## 2023-04-09 ENCOUNTER — Other Ambulatory Visit: Payer: Self-pay

## 2023-04-09 ENCOUNTER — Encounter (INDEPENDENT_AMBULATORY_CARE_PROVIDER_SITE_OTHER): Payer: Medicare Other | Admitting: Primary Care

## 2023-04-09 NOTE — Telephone Encounter (Signed)
Called to reschedule pr but was unable to leave a message.

## 2023-04-12 ENCOUNTER — Other Ambulatory Visit: Payer: Self-pay

## 2023-04-15 ENCOUNTER — Telehealth (INDEPENDENT_AMBULATORY_CARE_PROVIDER_SITE_OTHER): Payer: Self-pay | Admitting: Primary Care

## 2023-04-15 ENCOUNTER — Encounter (INDEPENDENT_AMBULATORY_CARE_PROVIDER_SITE_OTHER): Payer: Medicare Other | Admitting: Primary Care

## 2023-04-22 ENCOUNTER — Encounter (INDEPENDENT_AMBULATORY_CARE_PROVIDER_SITE_OTHER): Payer: Self-pay | Admitting: Primary Care

## 2023-04-22 ENCOUNTER — Ambulatory Visit (INDEPENDENT_AMBULATORY_CARE_PROVIDER_SITE_OTHER): Payer: Medicare Other | Admitting: Primary Care

## 2023-04-22 VITALS — BP 127/79 | HR 65 | Resp 16 | Ht 72.0 in | Wt 235.6 lb

## 2023-04-22 DIAGNOSIS — Z794 Long term (current) use of insulin: Secondary | ICD-10-CM

## 2023-04-22 DIAGNOSIS — Z7984 Long term (current) use of oral hypoglycemic drugs: Secondary | ICD-10-CM | POA: Diagnosis not present

## 2023-04-22 DIAGNOSIS — Z7985 Long-term (current) use of injectable non-insulin antidiabetic drugs: Secondary | ICD-10-CM | POA: Diagnosis not present

## 2023-04-22 DIAGNOSIS — Z0000 Encounter for general adult medical examination without abnormal findings: Secondary | ICD-10-CM | POA: Diagnosis not present

## 2023-04-22 NOTE — Progress Notes (Signed)
Subjective:   Daniel [name] is a 67 y.o. male who presents for a Welcome to Medicare exam.   Review of Systems: Comprehensive ROS is negative       Objective:    Today's Vitals   04/22/23 1450  BP: 127/79  Pulse: 65  Resp: 16  SpO2: 98%  Weight: 235 lb 9.6 oz (106.9 kg)  Height: 6' (1.829 m)   Body mass index is 31.95 kg/m.  Medications Outpatient Encounter Medications as of 04/22/2023  Medication Sig   amiodarone (PACERONE) 200 MG tablet Take 1 tablet (200 mg total) by mouth daily.   aspirin 81 MG EC tablet Take 1 tablet (81 mg total) by mouth daily.   empagliflozin (JARDIANCE) 25 MG TABS tablet Take 1 tablet (25 mg total) by mouth daily before breakfast.   insulin aspart protamine - aspart (NOVOLOG MIX 70/30 FLEXPEN) (70-30) 100 UNIT/ML FlexPen Inject 20 Units into the skin daily with breakfast.   Insulin Pen Needle 32G X 4 MM MISC Use to inject insulin twice daily.   metoprolol succinate (TOPROL-XL) 25 MG 24 hr tablet Take 1 tablet (25 mg total) by mouth daily.   mexiletine (MEXITIL) 150 MG capsule Take 1 capsule (150 mg total) by mouth 3 (three) times daily.   Multiple Vitamin (MULTIVITAMIN WITH MINERALS) TABS tablet Take 1 tablet by mouth daily.   Omega-3 Fatty Acids (FISH OIL PO) Take by mouth. daily   rosuvastatin (CRESTOR) 20 MG tablet Take 1 tablet (20 mg total) by mouth daily.   sacubitril-valsartan (ENTRESTO) 24-26 MG Take 1 tablet by mouth 2 (two) times daily.   Semaglutide,0.25 or 0.5MG /DOS, (OZEMPIC, 0.25 OR 0.5 MG/DOSE,) 2 MG/3ML SOPN Inject 0.5 mg into the skin once a week.   simethicone (MYLICON) 125 MG chewable tablet Chew 125 mg by mouth every 6 (six) hours as needed for flatulence.   torsemide (DEMADEX) 20 MG tablet Take 1 tablet (20 mg total) by mouth 2 (two) times daily.   No facility-administered encounter medications on file as of 04/22/2023.     History: Past Medical History:  Diagnosis Date   Asthma    Diabetes mellitus without complication  (HCC)    Hypertension    Myocarditis (HCC)    Past Surgical History:  Procedure Laterality Date   LEFT HEART CATH AND CORONARY ANGIOGRAPHY N/A 10/02/2021   Procedure: LEFT HEART CATH AND CORONARY ANGIOGRAPHY;  Surgeon: Elder Negus, MD;  Location: [facility] INVASIVE CV LAB;  Service: Cardiovascular;  Laterality: N/A;    Family History  Problem Relation Age of Onset   Cancer Mother        unknown primary   Heart attack Father    Heart disease Father 79       AMI   Cancer Paternal Aunt    Social History   Occupational History   Not on file  Tobacco Use   Smoking status: Never    Passive exposure: Never   Smokeless tobacco: Never  Vaping Use   Vaping status: Never Used  Substance and Sexual Activity   Alcohol use: No   Drug use: No   Sexual activity: Never    Tobacco Counseling Counseling given: Not Answered   Immunizations and Health Maintenance  There is no immunization history on file for this patient. Health Maintenance Due  Topic Date Due   COVID-19 Vaccine (1) Never done   OPHTHALMOLOGY EXAM  Never done   HIV Screening  Never done   DTaP/Tdap/Td (1 - Tdap) Never done  Colonoscopy  Never done    Activities of Daily Living   Row Labels 04/22/2023    2:47 PM  In your present state of health, do you have any difficulty performing the following activities:   Section Header. No data exists in this row.   Hearing?   0  Vision?   0  Difficulty concentrating or making decisions?   0  Walking or climbing stairs?   0  Dressing or bathing?   0  Doing errands, shopping?   0  Preparing Food and eating ?   N  Using the Toilet?   N  In the past six months, have you accidently leaked urine?   N  Do you have problems with loss of bowel control?   N  Managing your Medications?   N  Managing your Finances?   N  Housekeeping or managing your Housekeeping?   N    Physical Exam  (optional), or other factors deemed appropriate based on the beneficiary's medical and  social history and current clinical standards. General: No apparent distress.obese male  Eyes: Extraocular eye movements intact, pupils equal and round. Neck: Supple, trachea midline. Thyroid: No enlargement, mobile without fixation, no tenderness. Cardiovascular: Regular rhythm and rate, no murmur, normal radial pulses. Respiratory: Normal respiratory effort, clear to auscultation. Gastrointestinal: Normal pitch active bowel sounds, nontender abdomen without distention or appreciable hepatomegaly. Musculoskeletal: Normal muscle tone, no tenderness on palpation of tibia, no excessive thoracic kyphosis. Skin: Appropriate warmth, no visible rash. Mental status: Alert, conversant, speech clear, thought logical, appropriate mood and affect, no hallucinations or delusions evident. Hematologic/lymphatic: No cervical adenopathy, no visible ecchymoses.  Advanced Directives: Does Patient Have a Medical Advance Directive?: No    Assessment:    This is a routine wellness  examination for this patient .   Vision/Hearing screen Vision Screening   Right eye Left eye Both eyes  Without correction     With correction 20 20 20 25 20 20     Dietary issues and exercise activities discussed:      Goals   None     Depression Screen   Row Labels 04/22/2023    2:47 PM 04/02/2023    9:16 AM 10/26/2022    8:57 AM 06/26/2022    9:08 AM  PHQ 2/9 Scores   Section Header. No data exists in this row.      PHQ - 2 Score   0  0 0  Exception Documentation    Patient refusal       Fall Risk   Row Labels 04/22/2023    2:47 PM  Fall Risk    Section Header. No data exists in this row.   Falls in the past year?   0  Number falls in past yr:   0  Injury with Fall?   0  Risk for fall due to :   No Fall Risks    Cognitive Function   Row Labels 04/22/2023    2:49 PM  MMSE - Mini Mental State Exam   Section Header. No data exists in this row.   Orientation to time   5  Orientation to Place   5   Registration   3  Attention/ Calculation   5  Recall   3  Language- name 2 objects   2  Language- repeat   1  Language- follow 3 step command   3  Language- read & follow direction   1  Write a sentence  1  Copy design   1  Total score   30        Patient Care Team: Grayce Sessions, NP as PCP - General (Internal Medicine) Lanier Prude, MD as PCP - Electrophysiology (Cardiology)     Plan:  Shaydon was seen today for medicare wellness.  Diagnoses and all orders for this visit:  Encounter for Medicare annual wellness exam   I have personally reviewed and noted the following in the patient's chart:   Medical and social history Use of alcohol, tobacco or illicit drugs  Current medications and supplements Functional ability and status Nutritional status Physical activity Advanced directives List of other physicians Hospitalizations, surgeries, and ER visits in previous 12 months Vitals Screenings to include cognitive, depression, and falls Referrals and appointments  In addition, I have reviewed and discussed with patient certain preventive protocols, quality metrics, and best practice recommendations. A written personalized care plan for preventive services as well as general preventive health recommendations were provided to patient.     Grayce Sessions, NP 04/22/2023

## 2023-04-24 ENCOUNTER — Other Ambulatory Visit: Payer: Self-pay

## 2023-04-25 ENCOUNTER — Telehealth (HOSPITAL_COMMUNITY): Payer: Self-pay | Admitting: Cardiology

## 2023-04-29 LAB — COLOGUARD: COLOGUARD: NEGATIVE

## 2023-04-30 ENCOUNTER — Other Ambulatory Visit: Payer: Self-pay

## 2023-04-30 ENCOUNTER — Telehealth (INDEPENDENT_AMBULATORY_CARE_PROVIDER_SITE_OTHER): Payer: Self-pay

## 2023-04-30 NOTE — Telephone Encounter (Signed)
Contacted pt to go over lab results pt is aware and doesn't have any questions or concerns 

## 2023-05-09 ENCOUNTER — Other Ambulatory Visit: Payer: Self-pay

## 2023-05-10 ENCOUNTER — Other Ambulatory Visit: Payer: Self-pay

## 2023-05-20 ENCOUNTER — Other Ambulatory Visit: Payer: Self-pay

## 2023-05-27 ENCOUNTER — Other Ambulatory Visit: Payer: Self-pay

## 2023-06-02 ENCOUNTER — Encounter (HOSPITAL_COMMUNITY): Payer: Self-pay

## 2023-06-02 ENCOUNTER — Emergency Department (HOSPITAL_COMMUNITY)
Admission: EM | Admit: 2023-06-02 | Discharge: 2023-06-03 | Disposition: A | Discharge status: Home / Self Care | Payer: Medicare Other | Attending: Emergency Medicine | Admitting: Emergency Medicine

## 2023-06-02 ENCOUNTER — Emergency Department (HOSPITAL_COMMUNITY): Payer: Medicare Other

## 2023-06-02 DIAGNOSIS — R109 Unspecified abdominal pain: Secondary | ICD-10-CM | POA: Insufficient documentation

## 2023-06-02 DIAGNOSIS — R202 Paresthesia of skin: Secondary | ICD-10-CM | POA: Diagnosis not present

## 2023-06-02 DIAGNOSIS — I10 Essential (primary) hypertension: Secondary | ICD-10-CM | POA: Insufficient documentation

## 2023-06-02 DIAGNOSIS — Z7982 Long term (current) use of aspirin: Secondary | ICD-10-CM | POA: Insufficient documentation

## 2023-06-02 DIAGNOSIS — Z7984 Long term (current) use of oral hypoglycemic drugs: Secondary | ICD-10-CM | POA: Insufficient documentation

## 2023-06-02 DIAGNOSIS — Z794 Long term (current) use of insulin: Secondary | ICD-10-CM | POA: Insufficient documentation

## 2023-06-02 DIAGNOSIS — E119 Type 2 diabetes mellitus without complications: Secondary | ICD-10-CM | POA: Insufficient documentation

## 2023-06-02 DIAGNOSIS — E876 Hypokalemia: Secondary | ICD-10-CM | POA: Insufficient documentation

## 2023-06-02 DIAGNOSIS — R002 Palpitations: Secondary | ICD-10-CM | POA: Diagnosis present

## 2023-06-02 DIAGNOSIS — Z1152 Encounter for screening for COVID-19: Secondary | ICD-10-CM | POA: Diagnosis not present

## 2023-06-02 DIAGNOSIS — R059 Cough, unspecified: Secondary | ICD-10-CM | POA: Diagnosis not present

## 2023-06-02 LAB — CBC
HCT: 47.1 % (ref 39.0–52.0)
Hemoglobin: 15.7 g/dL (ref 13.0–17.0)
MCH: 29.6 pg (ref 26.0–34.0)
MCHC: 33.3 g/dL (ref 30.0–36.0)
MCV: 88.7 fL (ref 80.0–100.0)
Platelets: 156 10*3/uL (ref 150–400)
RBC: 5.31 MIL/uL (ref 4.22–5.81)
RDW: 12.8 % (ref 11.5–15.5)
WBC: 7.4 10*3/uL (ref 4.0–10.5)
nRBC: 0 % (ref 0.0–0.2)

## 2023-06-02 LAB — URINALYSIS, ROUTINE W REFLEX MICROSCOPIC
Bacteria, UA: NONE SEEN
Bilirubin Urine: NEGATIVE
Glucose, UA: >=500 mg/dL — AB
Hgb urine dipstick: NEGATIVE
Ketones, ur: NEGATIVE mg/dL
Leukocytes,Ua: NEGATIVE
Nitrite: NEGATIVE
Protein, ur: NEGATIVE mg/dL
Specific Gravity, Urine: 1.02 (ref 1.005–1.030)
pH: 6 (ref 5.0–8.0)

## 2023-06-02 LAB — COMPREHENSIVE METABOLIC PANEL
ALT: 23 U/L (ref 0–44)
AST: 24 U/L (ref 15–41)
Albumin: 3.8 g/dL (ref 3.5–5.0)
Alkaline Phosphatase: 55 U/L (ref 38–126)
Anion gap: 10 (ref 5–15)
BUN: 9 mg/dL (ref 8–23)
CO2: 28 mmol/L (ref 22–32)
Calcium: 8.7 mg/dL — ABNORMAL LOW (ref 8.9–10.3)
Chloride: 100 mmol/L (ref 98–111)
Creatinine, Ser: 1.18 mg/dL (ref 0.61–1.24)
GFR, Estimated: >60 mL/min (ref >60)
Glucose, Bld: 148 mg/dL — ABNORMAL HIGH (ref 70–99)
Potassium: 3.3 mmol/L — ABNORMAL LOW (ref 3.5–5.1)
Sodium: 138 mmol/L (ref 135–145)
Total Bilirubin: 1.2 mg/dL (ref 0.3–1.2)
Total Protein: 6.8 g/dL (ref 6.5–8.1)

## 2023-06-02 LAB — LIPASE, BLOOD: Lipase: 38 U/L (ref 11–51)

## 2023-06-02 MED ORDER — POTASSIUM CHLORIDE CRYS ER 20 MEQ PO TBCR
40.0000 meq | EXTENDED_RELEASE_TABLET | Freq: Once | ORAL | Status: AC
  Administered 2023-06-03: 40 meq via ORAL
  Filled 2023-06-02: qty 4

## 2023-06-02 MED ORDER — POTASSIUM CHLORIDE 10 MEQ/100ML IV SOLN
10.0000 meq | Freq: Once | INTRAVENOUS | Status: AC
  Administered 2023-06-03: 10 meq via INTRAVENOUS
  Filled 2023-06-02: qty 100

## 2023-06-02 MED ORDER — MAGNESIUM SULFATE IN D5W 1-5 GM/100ML-% IV SOLN
1.0000 g | Freq: Once | INTRAVENOUS | Status: AC
  Administered 2023-06-03: 1 g via INTRAVENOUS
  Filled 2023-06-02: qty 100

## 2023-06-02 NOTE — ED Triage Notes (Signed)
Pt c/o intermittent tingling in posterior head x 2-3 days, generalized abdominal pain and possible acid reflex after eating x2-3 days, and dry cough x 2-3 weeks.  Pain score 2/10.  Pt has not taken anything for symptoms.

## 2023-06-02 NOTE — ED Provider Notes (Incomplete)
[place] EMERGENCY DEPARTMENT AT [facility] Provider Note   CSN: [id number] Arrival date & time: 06/02/23  1810     History {Add pertinent medical, surgical, social history, OB history to HPI:1} Chief Complaint  Patient presents with  . Tingling  . Cough  . Abdominal Pain    Daniel [name] is a 67 y.o. 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  Patient refused ICD with cardiology, managed with amiodarone and mexiletine.  Patient with poor compliance due to financial strain.  Monitor showed 12.4% PVC burden.  Unclear exact symptoms prompting patient's ED visit this evening.  Presents with vague complaints for the last few days with generalized belly discomfort intermittent headaches.  Patient does endorse central chest pressure the last couple of days without shortness of breath.  Does not radiate, and is not with exacerbation.  Has been off of his Sherryll Burger due to running out of his medication.  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 DMT2 as well as HTN.  No anticoagulation.   HPI     Home Medications Prior to Admission medications   Medication Sig Start Date End Date Taking? Authorizing Provider  amiodarone (PACERONE) 200 MG tablet Take 1 tablet (200 mg total) by mouth daily. 11/27/22   [name], NP  aspirin 81 MG EC tablet Take 1 tablet (81 mg total) by mouth daily. 10/13/21   Patwardhan, Anabel Bene, MD  empagliflozin (JARDIANCE) 25 MG TABS tablet Take 1 tablet (25 mg total) by mouth daily before breakfast. 04/02/23   Grayce Sessions, NP  insulin aspart protamine - aspart (NOVOLOG MIX 70/30 FLEXPEN) (70-30) 100 UNIT/ML FlexPen Inject 20 Units into the skin daily with breakfast. 04/02/23   Grayce Sessions, NP  Insulin Pen Needle 32G X 4 MM MISC Use to inject insulin twice daily. 10/26/22   Grayce Sessions, NP  metoprolol succinate (TOPROL-XL) 25 MG 24 hr tablet Take 1 tablet (25 mg total) by mouth daily.  11/27/22   [name], NP  mexiletine (MEXITIL) 150 MG capsule Take 1 capsule (150 mg total) by mouth 3 (three) times daily. 10/12/22   [name], NP  Multiple Vitamin (MULTIVITAMIN WITH MINERALS) TABS tablet Take 1 tablet by mouth daily.    [provider]  Omega-3 Fatty Acids (FISH OIL PO) Take by mouth. daily    [provider]  rosuvastatin (CRESTOR) 20 MG tablet Take 1 tablet (20 mg total) by mouth daily. 09/18/22   [name], NP  sacubitril-valsartan (ENTRESTO) 24-26 MG Take 1 tablet by mouth 2 (two) times daily. 03/29/23   Lanier Prude, MD  Semaglutide,0.25 or 0.5MG /DOS, (OZEMPIC, 0.25 OR 0.5 MG/DOSE,) 2 MG/3ML SOPN Inject 0.5 mg into the skin once a week. 04/02/23   Grayce Sessions, NP  simethicone (MYLICON) 125 MG chewable tablet Chew 125 mg by mouth every 6 (six) hours as needed for flatulence.    [provider]  torsemide (DEMADEX) 20 MG tablet Take 1 tablet (20 mg total) by mouth 2 (two) times daily. 11/27/22   Patwardhan, Anabel Bene, MD      Allergies    Metformin and related    Review of Systems   Review of Systems  Constitutional: Negative.   HENT: Negative.    Eyes: Negative.   Respiratory: Negative.    Cardiovascular:  Positive for chest pain. Negative for palpitations and leg swelling.  Gastrointestinal:  Positive for abdominal pain. Negative for diarrhea, nausea, rectal pain and vomiting.  Genitourinary: Negative.   Musculoskeletal: Negative.   Skin: Negative.   Neurological:  Positive for headaches. Negative for dizziness, facial asymmetry and light-headedness.    Physical Exam Updated Vital Signs BP 134/79 (BP Location: Right Arm)   Pulse 66   Temp 98.3 F (36.8 C) (Oral)   Resp 14   Ht 6' (1.829 m)   Wt 106.6 kg   SpO2 100%   BMI 31.87 kg/m  Physical Exam Vitals and nursing note reviewed.  Constitutional:      Appearance: He is obese. He is not ill-appearing or toxic-appearing.  HENT:     Head:  Normocephalic and atraumatic.     Mouth/Throat:     Mouth: Mucous membranes are moist.     Pharynx: No oropharyngeal exudate or posterior oropharyngeal erythema.  Eyes:     General: Lids are normal. Vision grossly intact.        Right eye: No discharge.        Left eye: No discharge.     Conjunctiva/sclera: Conjunctivae normal.     Pupils: Pupils are equal, round, and reactive to light.  Cardiovascular:     Rate and Rhythm: Normal rate and regular rhythm.     Pulses: Normal pulses.     Heart sounds: Normal heart sounds. No murmur heard. Pulmonary:     Effort: Pulmonary effort is normal. No tachypnea, bradypnea, accessory muscle usage, prolonged expiration or respiratory distress.     Breath sounds: Normal breath sounds. No wheezing or rales.  Chest:     Chest wall: No mass, lacerations, deformity, swelling, tenderness or crepitus.  Abdominal:     General: Bowel sounds are normal. There is no distension.     Palpations: Abdomen is soft.     Tenderness: There is no abdominal tenderness. There is no right CVA tenderness, left CVA tenderness, guarding or rebound.  Musculoskeletal:        General: No deformity.     Cervical back: Neck supple.     Right lower leg: No edema.     Left lower leg: No edema.  Skin:    General: Skin is warm and dry.     Capillary Refill: Capillary refill takes less than 2 seconds.  Neurological:     General: No focal deficit present.     Mental Status: He is alert and oriented to person, place, and time. Mental status is at baseline.     GCS: GCS eye subscore is 4. GCS verbal subscore is 5. GCS motor subscore is 6.     Gait: Gait is intact.  Psychiatric:        Mood and Affect: Mood normal.     ED Results / Procedures / Treatments   Labs (all labs ordered are listed, but only abnormal results are displayed) Labs Reviewed  COMPREHENSIVE METABOLIC PANEL - Abnormal; Notable for the following components:      Result Value   Potassium 3.3 (*)     Glucose, Bld 148 (*)    Calcium 8.7 (*)    All other components within normal limits  URINALYSIS, ROUTINE W REFLEX MICROSCOPIC - Abnormal; Notable for the following components:   Glucose, UA >=500 (*)    All other components within normal limits  LIPASE, BLOOD  CBC    EKG None  Radiology No results found.  Procedures Procedures  {Document cardiac monitor, telemetry assessment procedure when appropriate:1}  Medications Ordered in ED Medications - No data to display  ED Course/ Medical Decision Making/ A&P  Clinical Course as of 06/02/23 2354  Sun Jun 02, 2023  2353 Case discussed with EDP Dr. Clayborne Dana. Will replete K both oral and IV for cardiac stabilization.  [RS]    Clinical Course User Index [RS] Mitesh Rosendahl, Eugene Gavia, PA-C   {   Click here for ABCD2, HEART and other calculatorsREFRESH Note before signing :1}                              Medical Decision Making 67 y/o male who presents with concern for chest pressure and mild abdominal pain.   VS normal on intake, cardiopulmonary exam with frequent PVCs, abdominal exam is benign.  Amount and/or Complexity of Data Reviewed Labs: ordered.    Details: CBC unremarkable, CMP with hypokalemia of 3.3 UA glucose greater than 500, lipase is normal.  DG chest chronic cardiomegaly and no acute cardiopulmonary disorder.  Mag Radiology: ordered. ECG/medicine tests:     Details: Increased PVCs from baseline, no acute ischemic changes.   Risk Prescription drug management.   ***  {Document critical care time when appropriate:1} {Document review of labs and clinical decision tools ie heart score, Chads2Vasc2 etc:1}  {Document your independent review of radiology images, and any outside records:1} {Document your discussion with family members, caretakers, and with consultants:1} {Document social determinants of health affecting pt's care:1} {Document your decision making why or why not admission, treatments were  needed:1} Final Clinical Impression(s) / ED Diagnoses Final diagnoses:  None    Rx / DC Orders ED Discharge Orders     None

## 2023-06-02 NOTE — ED Provider Notes (Incomplete)
[place] EMERGENCY DEPARTMENT AT [facility] Provider Note   CSN: [id number] Arrival date & time: 06/02/23  1810     History {Add pertinent medical, surgical, social history, OB history to HPI:1} Chief Complaint  Patient presents with   Tingling   Cough   Abdominal Pain    Daniel [name] is a 67 y.o. 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  Patient refused ICD with cardiology, managed with amiodarone and mexiletine.  Patient with poor compliance due to financial strain.  Monitor showed 12.4% PVC burden.  Unclear exact symptoms prompting patient's ED visit this evening.  Presents with vague complaints for the last few days with generalized belly discomfort intermittent headaches.  Patient does endorse central chest pressure the last couple of days without shortness of breath.  Does not radiate, and is not with exacerbation.  Has been off of his Sherryll Burger due to running out of his medication.  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 DMT2 as well as HTN.  No anticoagulation.   HPI     Home Medications Prior to Admission medications   Medication Sig Start Date End Date Taking? Authorizing Provider  amiodarone (PACERONE) 200 MG tablet Take 1 tablet (200 mg total) by mouth daily. 11/27/22   [name], NP  aspirin 81 MG EC tablet Take 1 tablet (81 mg total) by mouth daily. 10/13/21   Patwardhan, Anabel Bene, MD  empagliflozin (JARDIANCE) 25 MG TABS tablet Take 1 tablet (25 mg total) by mouth daily before breakfast. 04/02/23   Grayce Sessions, NP  insulin aspart protamine - aspart (NOVOLOG MIX 70/30 FLEXPEN) (70-30) 100 UNIT/ML FlexPen Inject 20 Units into the skin daily with breakfast. 04/02/23   Grayce Sessions, NP  Insulin Pen Needle 32G X 4 MM MISC Use to inject insulin twice daily. 10/26/22   Grayce Sessions, NP  metoprolol succinate (TOPROL-XL) 25 MG 24 hr tablet Take 1 tablet (25 mg total) by mouth daily. 11/27/22    [name], NP  mexiletine (MEXITIL) 150 MG capsule Take 1 capsule (150 mg total) by mouth 3 (three) times daily. 10/12/22   [name], NP  Multiple Vitamin (MULTIVITAMIN WITH MINERALS) TABS tablet Take 1 tablet by mouth daily.    [provider]  Omega-3 Fatty Acids (FISH OIL PO) Take by mouth. daily    [provider]  rosuvastatin (CRESTOR) 20 MG tablet Take 1 tablet (20 mg total) by mouth daily. 09/18/22   [name], NP  sacubitril-valsartan (ENTRESTO) 24-26 MG Take 1 tablet by mouth 2 (two) times daily. 03/29/23   Lanier Prude, MD  Semaglutide,0.25 or 0.5MG /DOS, (OZEMPIC, 0.25 OR 0.5 MG/DOSE,) 2 MG/3ML SOPN Inject 0.5 mg into the skin once a week. 04/02/23   Grayce Sessions, NP  simethicone (MYLICON) 125 MG chewable tablet Chew 125 mg by mouth every 6 (six) hours as needed for flatulence.    [provider]  torsemide (DEMADEX) 20 MG tablet Take 1 tablet (20 mg total) by mouth 2 (two) times daily. 11/27/22   Patwardhan, Anabel Bene, MD      Allergies    Metformin and related    Review of Systems   Review of Systems  Constitutional: Negative.   HENT: Negative.    Eyes: Negative.   Respiratory: Negative.    Cardiovascular:  Positive for chest pain. Negative for palpitations and leg swelling.  Gastrointestinal:  Positive for abdominal pain. Negative for diarrhea, nausea, rectal pain and vomiting.  Genitourinary: Negative.   Musculoskeletal: Negative.   Skin: Negative.   Neurological:  Positive for headaches. Negative for dizziness, facial asymmetry and light-headedness.    Physical Exam Updated Vital Signs BP 134/79 (BP Location: Right Arm)   Pulse 66   Temp 98.3 F (36.8 C) (Oral)   Resp 14   Ht 6' (1.829 m)   Wt 106.6 kg   SpO2 100%   BMI 31.87 kg/m  Physical Exam Vitals and nursing note reviewed.  Constitutional:      Appearance: He is obese. He is not ill-appearing or toxic-appearing.  HENT:     Head: Normocephalic and  atraumatic.     Mouth/Throat:     Mouth: Mucous membranes are moist.     Pharynx: No oropharyngeal exudate or posterior oropharyngeal erythema.  Eyes:     General: Lids are normal. Vision grossly intact.        Right eye: No discharge.        Left eye: No discharge.     Conjunctiva/sclera: Conjunctivae normal.     Pupils: Pupils are equal, round, and reactive to light.  Cardiovascular:     Rate and Rhythm: Normal rate and regular rhythm.     Pulses: Normal pulses.     Heart sounds: Normal heart sounds. No murmur heard. Pulmonary:     Effort: Pulmonary effort is normal. No tachypnea, bradypnea, accessory muscle usage, prolonged expiration or respiratory distress.     Breath sounds: Normal breath sounds. No wheezing or rales.  Chest:     Chest wall: No mass, lacerations, deformity, swelling, tenderness or crepitus.  Abdominal:     General: Bowel sounds are normal. There is no distension.     Palpations: Abdomen is soft.     Tenderness: There is no abdominal tenderness. There is no right CVA tenderness, left CVA tenderness, guarding or rebound.  Musculoskeletal:        General: No deformity.     Cervical back: Neck supple.     Right lower leg: No edema.     Left lower leg: No edema.  Skin:    General: Skin is warm and dry.     Capillary Refill: Capillary refill takes less than 2 seconds.  Neurological:     General: No focal deficit present.     Mental Status: He is alert and oriented to person, place, and time. Mental status is at baseline.     GCS: GCS eye subscore is 4. GCS verbal subscore is 5. GCS motor subscore is 6.     Gait: Gait is intact.  Psychiatric:        Mood and Affect: Mood normal.     ED Results / Procedures / Treatments   Labs (all labs ordered are listed, but only abnormal results are displayed) Labs Reviewed  COMPREHENSIVE METABOLIC PANEL - Abnormal; Notable for the following components:      Result Value   Potassium 3.3 (*)    Glucose, Bld 148 (*)     Calcium 8.7 (*)    All other components within normal limits  URINALYSIS, ROUTINE W REFLEX MICROSCOPIC - Abnormal; Notable for the following components:   Glucose, UA >=500 (*)    All other components within normal limits  LIPASE, BLOOD  CBC    EKG None  Radiology No results found.  Procedures Procedures  {Document cardiac monitor, telemetry assessment procedure when appropriate:1}  Medications Ordered in ED Medications - No data to display  ED Course/ Medical Decision Making/ A&P   {  Click here for ABCD2, HEART and other calculatorsREFRESH Note before signing :1}                              Medical Decision Making 67 y/o male who presents with concern for chest pressure and mild abdominal pain.   VS normal on intake, cardiopulmonary exam with frequent PVCs, abdominal exam is benign.  Amount and/or Complexity of Data Reviewed Labs: ordered.    Details: CBC unremarkable, CMP with hypokalemia of 3.3 UA glucose greater than 500, lipase is normal.  DG chest chronic cardiomegaly and no acute cardiopulmonary disorder.   Radiology: ordered.   ***  {Document critical care time when appropriate:1} {Document review of labs and clinical decision tools ie heart score, Chads2Vasc2 etc:1}  {Document your independent review of radiology images, and any outside records:1} {Document your discussion with family members, caretakers, and with consultants:1} {Document social determinants of health affecting pt's care:1} {Document your decision making why or why not admission, treatments were needed:1} Final Clinical Impression(s) / ED Diagnoses Final diagnoses:  None    Rx / DC Orders ED Discharge Orders     None

## 2023-06-03 ENCOUNTER — Other Ambulatory Visit: Payer: Self-pay

## 2023-06-03 LAB — MAGNESIUM: Magnesium: 2.4 mg/dL (ref 1.7–2.4)

## 2023-06-03 LAB — TROPONIN I (HIGH SENSITIVITY): Troponin I (High Sensitivity): 11 ng/L (ref <18)

## 2023-06-03 LAB — SARS CORONAVIRUS 2 BY RT PCR: SARS Coronavirus 2 by RT PCR: NEGATIVE

## 2023-06-03 MED ORDER — POTASSIUM CHLORIDE CRYS ER 20 MEQ PO TBCR
20.0000 meq | EXTENDED_RELEASE_TABLET | Freq: Every day | ORAL | 0 refills | Status: DC
  Filled 2023-06-03: qty 5, 5d supply, fill #0

## 2023-06-03 NOTE — Discharge Instructions (Addendum)
Your blood work was reassuring today with the exception of your potassium being low.  Your EKG revealed some changes that needed to be addressed.  For this reason you were given potassium and magnesium in the ER with improvement in your EKG.  Please take the prescribed potassium at home for the next few days.  Follow-up with your cardiologist for recheck of your potassium and reevaluation.  Return to the ER with any new severe symptoms.

## 2023-06-04 ENCOUNTER — Encounter (HOSPITAL_COMMUNITY): Payer: Self-pay | Admitting: Cardiology

## 2023-06-21 ENCOUNTER — Other Ambulatory Visit: Payer: Self-pay

## 2023-06-24 ENCOUNTER — Ambulatory Visit (INDEPENDENT_AMBULATORY_CARE_PROVIDER_SITE_OTHER): Payer: Medicare Other | Admitting: Primary Care

## 2023-06-24 ENCOUNTER — Encounter (INDEPENDENT_AMBULATORY_CARE_PROVIDER_SITE_OTHER): Payer: Self-pay

## 2023-06-24 ENCOUNTER — Other Ambulatory Visit: Payer: Self-pay

## 2023-06-28 ENCOUNTER — Other Ambulatory Visit: Payer: Self-pay

## 2023-07-02 ENCOUNTER — Telehealth (INDEPENDENT_AMBULATORY_CARE_PROVIDER_SITE_OTHER): Payer: Self-pay | Admitting: Primary Care

## 2023-07-03 ENCOUNTER — Other Ambulatory Visit: Payer: Self-pay

## 2023-07-03 ENCOUNTER — Ambulatory Visit (INDEPENDENT_AMBULATORY_CARE_PROVIDER_SITE_OTHER): Payer: Medicare Other | Admitting: Primary Care

## 2023-07-12 ENCOUNTER — Other Ambulatory Visit: Payer: Self-pay

## 2023-07-15 ENCOUNTER — Telehealth (HOSPITAL_COMMUNITY): Payer: Self-pay | Admitting: Vascular Surgery

## 2023-07-15 NOTE — Telephone Encounter (Signed)
Called pt to make new chf appt, pt VM IS NOT SET UP

## 2023-07-16 ENCOUNTER — Ambulatory Visit (INDEPENDENT_AMBULATORY_CARE_PROVIDER_SITE_OTHER): Payer: Medicare Other | Admitting: Primary Care

## 2023-07-25 ENCOUNTER — Encounter (INDEPENDENT_AMBULATORY_CARE_PROVIDER_SITE_OTHER): Payer: Self-pay | Admitting: Primary Care

## 2023-07-25 ENCOUNTER — Other Ambulatory Visit: Payer: Self-pay

## 2023-07-25 ENCOUNTER — Ambulatory Visit (INDEPENDENT_AMBULATORY_CARE_PROVIDER_SITE_OTHER): Payer: Medicare Other | Admitting: Primary Care

## 2023-07-25 VITALS — BP 149/81 | HR 60 | Resp 16 | Wt 243.8 lb

## 2023-07-25 DIAGNOSIS — Z2821 Immunization not carried out because of patient refusal: Secondary | ICD-10-CM

## 2023-07-25 DIAGNOSIS — Z794 Long term (current) use of insulin: Secondary | ICD-10-CM | POA: Diagnosis not present

## 2023-07-25 DIAGNOSIS — Z760 Encounter for issue of repeat prescription: Secondary | ICD-10-CM | POA: Diagnosis not present

## 2023-07-25 DIAGNOSIS — E118 Type 2 diabetes mellitus with unspecified complications: Secondary | ICD-10-CM | POA: Diagnosis not present

## 2023-07-25 DIAGNOSIS — Z1211 Encounter for screening for malignant neoplasm of colon: Secondary | ICD-10-CM

## 2023-07-25 LAB — POCT GLYCOSYLATED HEMOGLOBIN (HGB A1C): HbA1c, POC (controlled diabetic range): 7 % (ref 0.0–7.0)

## 2023-07-25 MED ORDER — POTASSIUM CHLORIDE CRYS ER 20 MEQ PO TBCR
20.0000 meq | EXTENDED_RELEASE_TABLET | Freq: Every day | ORAL | 1 refills | Status: DC
  Filled 2023-07-25: qty 30, 30d supply, fill #0

## 2023-07-25 MED ORDER — NOVOLOG MIX 70/30 FLEXPEN (70-30) 100 UNIT/ML ~~LOC~~ SUPN
20.0000 [IU] | PEN_INJECTOR | Freq: Every day | SUBCUTANEOUS | 4 refills | Status: DC
Start: 2023-07-25 — End: 2023-10-25
  Filled 2023-07-25 – 2023-08-21 (×2): qty 3, 15d supply, fill #0
  Filled 2023-08-22: qty 15, 75d supply, fill #0

## 2023-07-25 MED ORDER — EMPAGLIFLOZIN 25 MG PO TABS
25.0000 mg | ORAL_TABLET | Freq: Every day | ORAL | 3 refills | Status: DC
  Filled 2023-07-25 – 2023-10-21 (×2): qty 90, 90d supply, fill #0

## 2023-07-25 NOTE — Progress Notes (Signed)
Subjective:  Patient ID: Daniel [name], male    DOB: [birth year]  Age: 67 y.o. MRN: [id number]  CC: DM   HPI Daniel [name] presents forFollow-up of diabetes. Patient does not check blood sugar at home  Compliant with meds - Yes Checking CBGs? No  Fasting avg -   Postprandial average -  Exercising regularly? - No Watching carbohydrate intake? - Yes Neuropathy ? - Yes Hypoglycemic events - No  - Recovers with :   Pertinent ROS:  Polyuria - No Polydipsia - No Vision problems - No  Medications as noted below. Taking them regularly without complication/adverse reaction being reported today.   History Daniel [name] has a past medical history of Asthma, Diabetes mellitus without complication (HCC), Hypertension, and Myocarditis (HCC).   He has a past surgical history that includes LEFT HEART CATH AND CORONARY ANGIOGRAPHY (N/A, 10/02/2021).   His family history includes Cancer in his mother and paternal aunt; Heart attack in his father; Heart disease (age of onset: 69) in his father.He reports that he has never smoked. He has never been exposed to tobacco smoke. He has never used smokeless tobacco. He reports that he does not drink alcohol and does not use drugs.  Current Outpatient Medications on File Prior to Visit  Medication Sig Dispense Refill   amiodarone (PACERONE) 200 MG tablet Take 1 tablet (200 mg total) by mouth daily. 90 tablet 3   aspirin 81 MG EC tablet Take 1 tablet (81 mg total) by mouth daily. 90 tablet 3   ELDERBERRY PO Take 2 tablets by mouth daily. gummy     empagliflozin (JARDIANCE) 25 MG TABS tablet Take 1 tablet (25 mg total) by mouth daily before breakfast. 90 tablet 3   insulin aspart protamine - aspart (NOVOLOG MIX 70/30 FLEXPEN) (70-30) 100 UNIT/ML FlexPen Inject 20 Units into the skin daily with breakfast. 3 mL 4   Insulin Pen Needle 32G X 4 MM MISC Use to inject insulin twice daily. 100 each 2   metoprolol succinate (TOPROL-XL) 25 MG 24 hr tablet Take 1 tablet  (25 mg total) by mouth daily. 90 tablet 3   mexiletine (MEXITIL) 150 MG capsule Take 1 capsule (150 mg total) by mouth 3 (three) times daily. 90 capsule 11   Multiple Vitamin (MULTIVITAMIN WITH MINERALS) TABS tablet Take 1 tablet by mouth daily.     Omega-3 Fatty Acids (FISH OIL PO) Take by mouth. daily     potassium chloride SA (KLOR-CON M) 20 MEQ tablet Take 1 tablet (20 mEq total) by mouth daily for 5 days. 5 tablet 0   rosuvastatin (CRESTOR) 20 MG tablet Take 1 tablet (20 mg total) by mouth daily. 90 tablet 3   sacubitril-valsartan (ENTRESTO) 24-26 MG Take 1 tablet by mouth 2 (two) times daily. (Patient not taking: Reported on 06/03/2023) 180 tablet 3   Semaglutide,0.25 or 0.5MG /DOS, (OZEMPIC, 0.25 OR 0.5 MG/DOSE,) 2 MG/3ML SOPN Inject 0.5 mg into the skin once a week. (Patient taking differently: Inject 0.5 mg into the skin once a week. friday) 9 mL 1   torsemide (DEMADEX) 20 MG tablet Take 1 tablet (20 mg total) by mouth 2 (two) times daily. 180 tablet 3   No current facility-administered medications on file prior to visit.    ROS Comprehensive ROS Pertinent positive and negative noted in HPI   Objective:  BP (!) 149/81 (BP Location: Right Arm, Patient Position: Sitting, Cuff Size: Large)   Pulse 60   Resp 16   Wt 243 lb 12.8 oz (  110.6 kg)   SpO2 98%   BMI 33.07 kg/m    BP Readings from Last 3 Encounters:  07/25/23 (!) 149/81  06/03/23 119/70  04/22/23 127/79    Wt Readings from Last 3 Encounters:  07/25/23 243 lb 12.8 oz (110.6 kg)  06/02/23 235 lb (106.6 kg)  04/22/23 235 lb 9.6 oz (106.9 kg)    Physical Exam Vitals reviewed.  Constitutional:      Appearance: He is obese.  HENT:     Head: Normocephalic.     Right Ear: External ear normal.     Left Ear: External ear normal.     Nose: Nose normal.  Eyes:     Extraocular Movements: Extraocular movements intact.  Cardiovascular:     Rate and Rhythm: Normal rate and regular rhythm.  Pulmonary:     Effort:  Pulmonary effort is normal.     Breath sounds: Normal breath sounds.  Abdominal:     General: Bowel sounds are normal. There is distension.     Palpations: Abdomen is soft.  Musculoskeletal:        General: Normal range of motion.     Cervical back: Normal range of motion and neck supple.  Skin:    General: Skin is warm and dry.  Neurological:     Mental Status: He is alert and oriented to person, place, and time.  Psychiatric:        Mood and Affect: Mood normal.        Behavior: Behavior normal.    Lab Results  Component Value Date   HGBA1C 7.0 07/25/2023   HGBA1C 7.2 (A) 04/02/2023   HGBA1C 6.2 06/26/2022    Lab Results  Component Value Date   WBC 7.4 06/02/2023   HGB 15.7 06/02/2023   HCT 47.1 06/02/2023   PLT 156 06/02/2023   GLUCOSE 148 (H) 06/02/2023   CHOL 108 10/26/2022   TRIG 112 10/26/2022   HDL 56 10/26/2022   LDLCALC 32 10/26/2022   ALT 23 06/02/2023   AST 24 06/02/2023   NA 138 06/02/2023   K 3.3 (L) 06/02/2023   CL 100 06/02/2023   CREATININE 1.18 06/02/2023   BUN 9 06/02/2023   CO2 28 06/02/2023   TSH 0.916 09/18/2022   INR 1.0 10/02/2021   HGBA1C 7.0 07/25/2023     Assessment & Plan:   Diagnoses and all orders for this visit:  Type 2 diabetes mellitus with complication, with long-term current use of insulin (HCC) -     POCT glycosylated hemoglobin (Hb A1C) 7.0  - educated on lifestyle modifications, including but not limited to diet choices and adding exercise to daily routine.   Just ate beef a rooni  3 slices of bread d/w NO can foods or process food. Read labels f/u with cardiology   I am having [name] maintain his multivitamin with minerals, aspirin EC, Omega-3 Fatty Acids (FISH OIL PO), rosuvastatin, mexiletine, Insulin Pen Needle, metoprolol succinate, amiodarone, torsemide, Entresto, NovoLOG Mix 70/30 FlexPen, Ozempic (0.25 or 0.5 MG/DOSE), empagliflozin, ELDERBERRY PO, and potassium chloride SA.  No orders of the defined types  were placed in this encounter.    Follow-up:   No follow-ups on file.  The above assessment and management plan was discussed with the patient. The patient verbalized understanding of and has agreed to the management plan. Patient is aware to call the clinic if symptoms fail to improve or worsen. Patient is aware when to return to the clinic for a follow-up visit.  Patient educated on when it is appropriate to go to the emergency department.   Gwinda Passe, NP-C

## 2023-07-30 ENCOUNTER — Other Ambulatory Visit: Payer: Self-pay

## 2023-08-07 ENCOUNTER — Other Ambulatory Visit: Payer: Self-pay

## 2023-08-09 ENCOUNTER — Other Ambulatory Visit: Payer: Self-pay

## 2023-08-09 NOTE — Telephone Encounter (Signed)
Unable to leave VM

## 2023-08-09 NOTE — Telephone Encounter (Signed)
Confirmed with pt.

## 2023-08-12 ENCOUNTER — Other Ambulatory Visit: Payer: Self-pay

## 2023-08-19 ENCOUNTER — Ambulatory Visit: Payer: Self-pay | Admitting: Cardiology

## 2023-08-21 ENCOUNTER — Other Ambulatory Visit: Payer: Self-pay

## 2023-08-22 ENCOUNTER — Other Ambulatory Visit: Payer: Self-pay

## 2023-08-23 ENCOUNTER — Other Ambulatory Visit: Payer: Self-pay

## 2023-09-02 ENCOUNTER — Other Ambulatory Visit: Payer: Self-pay

## 2023-09-11 ENCOUNTER — Encounter: Payer: Self-pay | Admitting: Physician Assistant

## 2023-09-16 ENCOUNTER — Other Ambulatory Visit: Payer: Self-pay

## 2023-09-25 ENCOUNTER — Other Ambulatory Visit: Payer: Self-pay

## 2023-10-04 NOTE — Progress Notes (Deleted)
  Electrophysiology Office Note:   Date:  10/04/2023  ID:  Daniel [name], DOB [birth year], MRN [id number]  Primary Cardiologist: None Primary Heart Failure: None Electrophysiologist: Lanier Prude, MD  {Click to update primary MD,subspecialty MD or APP then REFRESH:1}    History of Present Illness:   Daniel [name] is a 67 y.o. 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   Since last being seen in our clinic the patient reports doing ***.  he denies chest pain, palpitations, dyspnea, PND, orthopnea, nausea, vomiting, dizziness, syncope, edema, weight gain, or early satiety.   Review of systems complete and found to be negative unless listed in HPI.   EP Information / Studies Reviewed:    EKG is ordered today. Personal review as below.      Studies:  ECHO 12/2021 > LVEF 54%, G1DD cMRI 01/2022 > mild LVE with global hypokinesis, EF 42% (improved from 09/2021 MRI when 36%) Cardiac Monitor 09/2022 > 12.4% PVC burden (3 morphologies), 103 NSVT's   Arrhythmia / AAD VT > in setting of myocarditis, ICD deferred by patient, managed with mexiletine & amiodarone PVC's     Risk Assessment/Calculations:     No BP recorded.  {Refresh Note OR Click here to enter BP  :1}***        Physical Exam:   VS:  There were no vitals taken for this visit.   Wt Readings from Last 3 Encounters:  07/25/23 243 lb 12.8 oz (110.6 kg)  06/02/23 235 lb (106.6 kg)  04/22/23 235 lb 9.6 oz (106.9 kg)     GEN: Well nourished, well developed in no acute distress NECK: No JVD; No carotid bruits CARDIAC: {EPRHYTHM:28826}, no murmurs, rubs, gallops RESPIRATORY:  Clear to auscultation without rales, wheezing or rhonchi  ABDOMEN: Soft, non-tender, non-distended EXTREMITIES:  No edema; No deformity   ASSESSMENT AND PLAN:    VT  Myocarditis High Risk Drug Monitoring  Trifascicular Block   -continue amiodarone, mexiletine  -assess amio labs CMP, TSH, free T4 *** -given trifasicular block, caution with further changes to AV nodal blocking agents -CXR reviewed from 05/2023, no evidence of fibrosis  PVC's  -pleomorphic on monitoring, catheter ablation would be poor treatment strategy  HFrEF / NICM  NYHA II  -euvolemic on exam  -continue current medical therapy  -NM PET was ordered in 03/2023 but not completed  ?? HF referral  Follow up with Dr. Lalla Brothers in 6 months  Signed, Canary Brim, MSN, APRN, NP-C, AGACNP-BC [facility] - Electrophysiology  10/04/2023, 10:27 AM

## 2023-10-07 ENCOUNTER — Other Ambulatory Visit: Payer: Self-pay

## 2023-10-08 ENCOUNTER — Ambulatory Visit: Payer: Medicare Other | Admitting: Pulmonary Disease

## 2023-10-08 DIAGNOSIS — I5020 Unspecified systolic (congestive) heart failure: Secondary | ICD-10-CM

## 2023-10-08 DIAGNOSIS — I493 Ventricular premature depolarization: Secondary | ICD-10-CM

## 2023-10-08 DIAGNOSIS — I472 Ventricular tachycardia, unspecified: Secondary | ICD-10-CM

## 2023-10-08 DIAGNOSIS — I428 Other cardiomyopathies: Secondary | ICD-10-CM

## 2023-10-08 DIAGNOSIS — Z79899 Other long term (current) drug therapy: Secondary | ICD-10-CM

## 2023-10-11 ENCOUNTER — Other Ambulatory Visit: Payer: Self-pay | Admitting: Cardiology

## 2023-10-11 ENCOUNTER — Other Ambulatory Visit: Payer: Self-pay

## 2023-10-11 DIAGNOSIS — I25118 Atherosclerotic heart disease of native coronary artery with other forms of angina pectoris: Secondary | ICD-10-CM

## 2023-10-11 MED ORDER — ROSUVASTATIN CALCIUM 20 MG PO TABS
20.0000 mg | ORAL_TABLET | Freq: Every day | ORAL | 0 refills | Status: DC
  Filled 2023-10-11: qty 30, 30d supply, fill #0

## 2023-10-20 NOTE — Progress Notes (Signed)
 Electrophysiology Office Note:   Date:  10/21/2023  ID:  Daniel [name], DOB April 17, 1956, MRN [id number]  Primary Cardiologist: None Primary Heart Failure: None Electrophysiologist: OLE ONEIDA HOLTS, MD      History of Present Illness:   Daniel [name] is a 68 y.o. 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   He was last seen in June 79795 by Dr. Holts and was planned for cardiac PET to look for active inflammation and he was referred to the Advanced HF Team.  The PET was not completed and the patient did not see HF.  Since last being seen in our clinic the patient reports he has been feeling well overall. He ran out of Jardiance  and is unsure where to go get it.  He also needs refills on his Entresto .  No new issues recently.  He seems surprised that he has been feeling well.    He denies chest pain, palpitations, dyspnea, PND, orthopnea, nausea, vomiting, dizziness, syncope, edema, weight gain, or early satiety.   Review of systems complete and found to be negative unless listed in HPI.   EP Information / Studies Reviewed:    EKG is ordered today. Personal review as below.  EKG Interpretation Date/Time:  Monday October 21 2023 11:07:29 EST Ventricular Rate:  58 PR Interval:  196 QRS Duration:  142 QT Interval:  452 QTC Calculation: 443 R Axis:   -84  Text Interpretation: Sinus bradycardia with Premature ventricular complexes Right bundle branch block Left anterior fascicular block Bifascicular block Confirmed by Daniel [name] ([id number]) on 10/21/2023 11:11:38 AM   Studies:  ECHO 12/2021 > LVEF 54%, G1DD cMRI 01/2022 > mild LVE with global hypokinesis, EF 42% (improved from 09/2021 MRI when 36%) Cardiac Monitor 09/2022 > 12.4% PVC burden (3 morphologies), 103 NSVT's   Arrhythmia / AAD VT > in setting of myocarditis, ICD deferred by patient, managed with mexiletine &  amiodarone  PVC's            Physical Exam:   VS:  BP 128/82   Pulse (!) 59   Ht 6' (1.829 m)   Wt 244 lb 6.4 oz (110.9 kg)   SpO2 98%   BMI 33.15 kg/m    Wt Readings from Last 3 Encounters:  10/21/23 244 lb 6.4 oz (110.9 kg)  07/25/23 243 lb 12.8 oz (110.6 kg)  06/02/23 235 lb (106.6 kg)     GEN: Well nourished, well developed in no acute distress NECK: No JVD; No carotid bruits CARDIAC: Regular rate and rhythm, no murmurs, rubs, gallops RESPIRATORY:  Clear to auscultation without rales, wheezing or rhonchi  ABDOMEN: Soft, non-tender, non-distended EXTREMITIES:  No edema; No deformity   ASSESSMENT AND PLAN:    VT  Myocarditis High Risk Drug Monitoring  Trifascicular Block  -continue amiodarone , mexiletine  -assess amio labs CMP, TSH, free T4   -given trifasicular block, caution with further changes to AV nodal blocking agents -CXR reviewed from 05/2023, no evidence of fibrosis  PVC's  -pleomorphic on monitoring, catheter ablation would be poor treatment strategy -prior monitor with 12.4% burden -continue amiodarone , Toprol    HFrEF / NICM  NYHA II  -euvolemic on exam  -continue current medical therapy  -NM PET was ordered in 03/2023 but not completed  -needs HF referral > previously ordered, not done for myocarditis with rEF.  Pt referred again today. He is self pay, waiting on medicaid  approval -refill entresto  > labs as above  -reviewed with PCP via secure chat > pt has jardiance  and can go pick it up from outpatient pharmacy   Follow up with Dr. Cindie in 6 months  Signed, [name], MSN, APRN, NP-C, AGACNP-BC [facility] - Electrophysiology  10/21/2023, 5:16 PM

## 2023-10-21 ENCOUNTER — Encounter: Payer: Self-pay | Admitting: Pulmonary Disease

## 2023-10-21 ENCOUNTER — Other Ambulatory Visit: Payer: Self-pay

## 2023-10-21 ENCOUNTER — Ambulatory Visit: Payer: Medicare Other | Attending: Pulmonary Disease | Admitting: Pulmonary Disease

## 2023-10-21 VITALS — BP 128/82 | HR 59 | Ht 72.0 in | Wt 244.4 lb

## 2023-10-21 DIAGNOSIS — I428 Other cardiomyopathies: Secondary | ICD-10-CM | POA: Diagnosis present

## 2023-10-21 DIAGNOSIS — Z79899 Other long term (current) drug therapy: Secondary | ICD-10-CM | POA: Insufficient documentation

## 2023-10-21 DIAGNOSIS — I401 Isolated myocarditis: Secondary | ICD-10-CM | POA: Diagnosis present

## 2023-10-21 DIAGNOSIS — I472 Ventricular tachycardia, unspecified: Secondary | ICD-10-CM | POA: Insufficient documentation

## 2023-10-21 DIAGNOSIS — I493 Ventricular premature depolarization: Secondary | ICD-10-CM | POA: Insufficient documentation

## 2023-10-21 DIAGNOSIS — I5020 Unspecified systolic (congestive) heart failure: Secondary | ICD-10-CM | POA: Insufficient documentation

## 2023-10-21 NOTE — Patient Instructions (Addendum)
 Medication Instructions:  Your physician recommends that you continue on your current medications as directed. Please refer to the Current Medication list given to you today.  *If you need a refill on your cardiac medications before your next appointment, please call your pharmacy*  Lab Work: CMP, TSH, FreeT4-TODAY If you have labs (blood work) drawn today and your tests are completely normal, you will receive your results only by: MyChart Message (if you have MyChart) OR A paper copy in the mail If you have any lab test that is abnormal or we need to change your treatment, we will call you to review the results.   Testing/Procedures: .   Please report to Radiology at the [facility] Main Entrance 30 minutes early for your test.  [address] [place], [place] [address]                         OR   Please report to Radiology at [facility] Main Entrance, medical mall, 30 mins prior to your test.  [address]  [place], [place]  [phone]  How to Prepare for Your Cardiac PET/CT Stress Test:  Nothing to eat or drink, except water, 3 hours prior to arrival time.  NO caffeine/decaffeinated products, or chocolate 12 hours prior to arrival. (Please note decaffeinated beverages (teas/coffees) still contain caffeine).  If you have caffeine within 12 hours prior, the test will need to be rescheduled.  Medication instructions: Do not take erectile dysfunction medications for 72 hours prior to test (sildenafil, tadalafil) Do not take nitrates (isosorbide mononitrate, Ranexa) the day before or day of test Do not take tamsulosin the day before or morning of test Hold theophylline containing medications for 12 hours. Hold Dipyridamole 48 hours prior to the test.  Diabetic Preparation: If able to eat breakfast prior to 3 hour fasting, you may take all medications, including your insulin . Do not worry if you miss your breakfast dose of insulin  -  start at your next meal. If you do not eat prior to 3 hour fast-Hold all diabetes (oral and insulin ) medications. Patients who wear a continuous glucose monitor MUST remove the device prior to scanning.  You may take your remaining medications with water.  NO perfume, cologne or lotion on chest or abdomen area. FEMALES - Please avoid wearing dresses to this appointment.  Total time is 1 to 2 hours; you may want to bring reading material for the waiting time.  IF YOU THINK YOU MAY BE PREGNANT, OR ARE NURSING PLEASE INFORM THE TECHNOLOGIST.  In preparation for your appointment, medication and supplies will be purchased.  Appointment availability is limited, so if you need to cancel or reschedule, please call the Radiology Department at [phone] Geroge Law) OR [phone] [facility]) 24 hours in advance to avoid a cancellation fee of $100.00  What to Expect When you Arrive:  Once you arrive and check in for your appointment, you will be taken to a preparation room within the Radiology Department.  A technologist or Nurse will obtain your medical history, verify that you are correctly prepped for the exam, and explain the procedure.  Afterwards, an IV will be started in your arm and electrodes will be placed on your skin for EKG monitoring during the stress portion of the exam. Then you will be escorted to the PET/CT scanner.  There, staff will get you positioned on the scanner and obtain a blood pressure and EKG.  During  the exam, you will continue to be connected to the EKG and blood pressure machines.  A small, safe amount of a radioactive tracer will be injected in your IV to obtain a series of pictures of your heart along with an injection of a stress agent.    After your Exam:  It is recommended that you eat a meal and drink a caffeinated beverage to counter act any effects of the stress agent.  Drink plenty of fluids for the remainder of the day and urinate frequently for the first couple  of hours after the exam.  Your doctor will inform you of your test results within 7-10 business days.  For more information and frequently asked questions, please visit our website: [email or web address]  For questions about your test or how to prepare for your test, please call: Cardiac Imaging Nurse Navigators Office: [phone]    Follow-Up: At [facility], you and your health needs are our priority.  As part of our continuing mission to provide you with exceptional heart care, we have created designated Provider Care Teams.  These Care Teams include your primary Cardiologist (physician) and Advanced Practice Providers (APPs -  Physician Assistants and Nurse Practitioners) who all work together to provide you with the care you need, when you need it.  We recommend signing up for the patient portal called MyChart.  Sign up information is provided on this After Visit Summary.  MyChart is used to connect with patients for Virtual Visits (Telemedicine).  Patients are able to view lab/test results, encounter notes, upcoming appointments, etc.  Non-urgent messages can be sent to your provider as well.   To learn more about what you can do with MyChart, go to [email or web address].    Your next appointment:   6 month(s)  Provider:   Ole Holts, MD   You have been referred to the CHF clinic. You will receive a call to schedule this appointment.

## 2023-10-22 LAB — T4, FREE: Free T4: 1.66 ng/dL (ref 0.82–1.77)

## 2023-10-22 LAB — COMPREHENSIVE METABOLIC PANEL
ALT: 15 [IU]/L (ref 0–44)
AST: 13 [IU]/L (ref 0–40)
Albumin: 4.1 g/dL (ref 3.9–4.9)
Alkaline Phosphatase: 98 [IU]/L (ref 44–121)
BUN/Creatinine Ratio: 16 (ref 10–24)
BUN: 18 mg/dL (ref 8–27)
Bilirubin Total: 0.6 mg/dL (ref 0.0–1.2)
CO2: 29 mmol/L (ref 20–29)
Calcium: 9.3 mg/dL (ref 8.6–10.2)
Chloride: 97 mmol/L (ref 96–106)
Creatinine, Ser: 1.16 mg/dL (ref 0.76–1.27)
Globulin, Total: 2.5 g/dL (ref 1.5–4.5)
Glucose: 237 mg/dL — ABNORMAL HIGH (ref 70–99)
Potassium: 4.6 mmol/L (ref 3.5–5.2)
Sodium: 139 mmol/L (ref 134–144)
Total Protein: 6.6 g/dL (ref 6.0–8.5)
eGFR: 70 mL/min/{1.73_m2} (ref >59)

## 2023-10-22 LAB — TSH: TSH: 1.37 u[IU]/mL (ref 0.450–4.500)

## 2023-10-25 ENCOUNTER — Other Ambulatory Visit: Payer: Self-pay

## 2023-10-25 ENCOUNTER — Ambulatory Visit (INDEPENDENT_AMBULATORY_CARE_PROVIDER_SITE_OTHER): Payer: Medicare Other | Admitting: Primary Care

## 2023-10-25 ENCOUNTER — Encounter (INDEPENDENT_AMBULATORY_CARE_PROVIDER_SITE_OTHER): Payer: Self-pay | Admitting: Primary Care

## 2023-10-25 VITALS — BP 145/87 | HR 60 | Resp 16 | Ht 72.0 in | Wt 243.8 lb

## 2023-10-25 DIAGNOSIS — Z794 Long term (current) use of insulin: Secondary | ICD-10-CM

## 2023-10-25 DIAGNOSIS — Z760 Encounter for issue of repeat prescription: Secondary | ICD-10-CM

## 2023-10-25 DIAGNOSIS — Z6833 Body mass index (BMI) 33.0-33.9, adult: Secondary | ICD-10-CM

## 2023-10-25 DIAGNOSIS — E118 Type 2 diabetes mellitus with unspecified complications: Secondary | ICD-10-CM

## 2023-10-25 DIAGNOSIS — Z7985 Long-term (current) use of injectable non-insulin antidiabetic drugs: Secondary | ICD-10-CM

## 2023-10-25 DIAGNOSIS — E66811 Obesity, class 1: Secondary | ICD-10-CM

## 2023-10-25 DIAGNOSIS — E669 Obesity, unspecified: Secondary | ICD-10-CM

## 2023-10-25 DIAGNOSIS — Z7984 Long term (current) use of oral hypoglycemic drugs: Secondary | ICD-10-CM

## 2023-10-25 LAB — POCT GLYCOSYLATED HEMOGLOBIN (HGB A1C): HbA1c, POC (controlled diabetic range): 9.1 % — AB (ref 0.0–7.0)

## 2023-10-25 MED ORDER — NOVOLOG MIX 70/30 FLEXPEN (70-30) 100 UNIT/ML ~~LOC~~ SUPN
20.0000 [IU] | PEN_INJECTOR | Freq: Every day | SUBCUTANEOUS | 4 refills | Status: DC
Start: 2023-10-25 — End: 2024-02-25
  Filled 2023-10-25: qty 3, 15d supply, fill #0
  Filled 2023-11-08: qty 15, 75d supply, fill #0

## 2023-10-25 MED ORDER — OZEMPIC (0.25 OR 0.5 MG/DOSE) 2 MG/3ML ~~LOC~~ SOPN
0.5000 mg | PEN_INJECTOR | SUBCUTANEOUS | 1 refills | Status: DC
Start: 2023-10-25 — End: 2024-06-12
  Filled 2023-10-25: qty 9, 84d supply, fill #0
  Filled 2024-03-12: qty 9, 84d supply, fill #1

## 2023-10-25 MED ORDER — EMPAGLIFLOZIN 25 MG PO TABS
25.0000 mg | ORAL_TABLET | Freq: Every day | ORAL | 3 refills | Status: DC
  Filled 2023-10-25 (×2): qty 90, 90d supply, fill #0

## 2023-10-25 NOTE — Progress Notes (Unsigned)
[facility]  Goodman Toennies, is a 68 y.o. male  ZOX:[id number]  WJX:[id number]  DOB - 11-19-1955  Chief Complaint  Patient presents with   Diabetes       Subjective:   Daniel [name] is a 68 y.o. male here today for a follow up visit. Patient has No headache, No chest pain, No abdominal pain - No Nausea, No new weakness tingling or numbness, No Cough - shortness of breath HPI  No problems updated.  Comprehensive ROS Pertinent positive and negative noted in HPI   Allergies  Allergen Reactions   Metformin And Related Nausea Only    Past Medical History:  Diagnosis Date   Asthma    Diabetes mellitus without complication (HCC)    Hypertension    Myocarditis (HCC)     Current Outpatient Medications on File Prior to Visit  Medication Sig Dispense Refill   amiodarone (PACERONE) 200 MG tablet Take 1 tablet (200 mg total) by mouth daily. 90 tablet 3   aspirin 81 MG EC tablet Take 1 tablet (81 mg total) by mouth daily. 90 tablet 3   ELDERBERRY PO Take 2 tablets by mouth daily. gummy     Insulin Pen Needle 32G X 4 MM MISC Use to inject insulin twice daily. 100 each 2   metoprolol succinate (TOPROL-XL) 25 MG 24 hr tablet Take 1 tablet (25 mg total) by mouth daily. 90 tablet 3   mexiletine (MEXITIL) 150 MG capsule Take 1 capsule (150 mg total) by mouth 3 (three) times daily. 90 capsule 11   Multiple Vitamin (MULTIVITAMIN WITH MINERALS) TABS tablet Take 1 tablet by mouth daily.     Omega-3 Fatty Acids (FISH OIL PO) Take by mouth. daily     potassium chloride SA (KLOR-CON M) 20 MEQ tablet Take 1 tablet (20 mEq total) by mouth daily. 30 tablet 1   rosuvastatin (CRESTOR) 20 MG tablet Take 1 tablet (20 mg total) by mouth daily.Please call our office to schedule an overdue appointment with Dr. Rosemary Holms before anymore refills. [phone]. 30 tablet 0   sacubitril-valsartan (ENTRESTO) 24-26 MG Take 1 tablet by mouth 2 (two) times daily. 180 tablet 3   torsemide (DEMADEX)  20 MG tablet Take 1 tablet (20 mg total) by mouth 2 (two) times daily. 180 tablet 3   No current facility-administered medications on file prior to visit.   Health Maintenance  Topic Date Due   COVID-19 Vaccine (1) Never done   Eye exam for diabetics  Never done   HIV Screening  Never done   Colon Cancer Screening  Never done   Yearly kidney health urinalysis for diabetes  10/27/2023   Zoster (Shingles) Vaccine (1 of 2) 10/25/2023*   Flu Shot  01/06/2024*   Pneumonia Vaccine (1 of 2 - PCV) 04/01/2024*   DTaP/Tdap/Td vaccine (1 - Tdap) 07/24/2024*   Hemoglobin A1C  01/23/2024   Complete foot exam   04/01/2024   Medicare Annual Wellness Visit  04/21/2024   Yearly kidney function blood test for diabetes  10/20/2024   Hepatitis C Screening  Completed   HPV Vaccine  Aged Out  *Topic was postponed. The date shown is not the original due date.    Objective:   Vitals:   10/25/23 1107 10/25/23 1108  BP: (!) 152/84 (!) 145/87  Pulse: 60   Resp: 16   SpO2: 98%   Weight: 243 lb 12.8 oz (110.6 kg)   Height: 6' (1.829 m)    {Vitals History (Optional):23777}  Physical Exam  {  Labs (Optional):23779}  Assessment & Plan  Type 2 diabetes mellitus with complication, with long-term current use of insulin (HCC) -     POCT glycosylated hemoglobin (Hb A1C) -     Ozempic (0.25 or 0.5 MG/DOSE); Inject 0.5 mg into the skin once a week.  Dispense: 9 mL; Refill: 1 -     NovoLOG Mix 70/30 FlexPen; Inject 20 Units into the skin daily with breakfast.  Dispense: 3 mL; Refill: 4 -     Empagliflozin; Take 1 tablet (25 mg total) by mouth daily before breakfast.  Dispense: 90 tablet; Refill: 3  Medication refill -     Ozempic (0.25 or 0.5 MG/DOSE); Inject 0.5 mg into the skin once a week.  Dispense: 9 mL; Refill: 1 -     NovoLOG Mix 70/30 FlexPen; Inject 20 Units into the skin daily with breakfast.  Dispense: 3 mL; Refill: 4 -     Empagliflozin; Take 1 tablet (25 mg total) by mouth daily before  breakfast.  Dispense: 90 tablet; Refill: 3     Patient have been counseled extensively about nutrition and exercise. Other issues discussed during this visit include: low cholesterol diet, weight control and daily exercise, foot care, annual eye examinations at Ophthalmology, importance of adherence with medications and regular follow-up. We also discussed long term complications of uncontrolled diabetes and hypertension.   No follow-ups on file.  The patient was given clear instructions to go to ER or return to medical center if symptoms don't improve, worsen or new problems develop. The patient verbalized understanding. The patient was told to call to get lab results if they haven't heard anything in the next week.   This note has been created with Education officer, environmental. Any transcriptional errors are unintentional.   Grayce Sessions, NP 10/25/2023, 11:16 AM

## 2023-10-28 ENCOUNTER — Other Ambulatory Visit: Payer: Self-pay

## 2023-11-08 ENCOUNTER — Other Ambulatory Visit: Payer: Self-pay

## 2023-11-08 ENCOUNTER — Other Ambulatory Visit: Payer: Self-pay | Admitting: Cardiology

## 2023-11-08 NOTE — Telephone Encounter (Signed)
 Please contact pt for future appointment. Pt due for 6 month f/u.

## 2023-11-14 ENCOUNTER — Other Ambulatory Visit: Payer: Self-pay

## 2023-11-14 MED ORDER — MEXILETINE HCL 150 MG PO CAPS
150.0000 mg | ORAL_CAPSULE | Freq: Three times a day (TID) | ORAL | 3 refills | Status: AC
  Filled 2023-11-14: qty 90, 30d supply, fill #0
  Filled 2023-12-16: qty 90, 30d supply, fill #1
  Filled 2024-02-11: qty 90, 30d supply, fill #2
  Filled 2024-03-12: qty 90, 30d supply, fill #3
  Filled 2024-06-04: qty 90, 30d supply, fill #4
  Filled 2024-08-06: qty 90, 30d supply, fill #5
  Filled 2024-10-09: qty 90, 30d supply, fill #6

## 2023-11-15 ENCOUNTER — Other Ambulatory Visit (INDEPENDENT_AMBULATORY_CARE_PROVIDER_SITE_OTHER): Payer: Self-pay | Admitting: Primary Care

## 2023-11-15 ENCOUNTER — Other Ambulatory Visit: Payer: Self-pay

## 2023-11-15 DIAGNOSIS — Z794 Long term (current) use of insulin: Secondary | ICD-10-CM

## 2023-11-15 DIAGNOSIS — Z760 Encounter for issue of repeat prescription: Secondary | ICD-10-CM

## 2023-11-15 NOTE — Telephone Encounter (Signed)
 Requested medication (s) are due for refill today: yes  Requested medication (s) are on the active medication list: yes  Last refill:  10/26/22 #100 2 refills  Future visit scheduled: yes in 2 months   Notes to clinic:  expired medication date. Do you want to renew Rx?     Requested Prescriptions  Pending Prescriptions Disp Refills   Insulin  Pen Needle (TECHLITE PLUS PEN NEEDLES) 32G X 4 MM MISC 100 each 2    Sig: Use to inject insulin  twice daily.     Endocrinology: Diabetes - Testing Supplies Passed - 11/15/2023  4:26 PM      Passed - Valid encounter within last 12 months    Recent Outpatient Visits           3 weeks ago Type 2 diabetes mellitus with complication, with long-term current use of insulin  (HCC)   [place] [facility] Celestia Rosaline SQUIBB, NP   3 months ago Type 2 diabetes mellitus with complication, with long-term current use of insulin  [facility])   [place] [facility] Celestia Rosaline SQUIBB, NP   6 months ago Encounter for [employer] annual wellness exam   [place] [facility] Celestia Rosaline SQUIBB, NP   7 months ago Type 2 diabetes mellitus with complication, with long-term current use of insulin  [facility])   [place] [facility] Celestia Rosaline SQUIBB, NP   1 year ago Healthcare maintenance   [place] [facility] Celestia Rosaline SQUIBB, NP       Future Appointments             In 2 months Celestia, Rosaline SQUIBB, NP [place] [facility]

## 2023-11-18 MED ORDER — TECHLITE PLUS PEN NEEDLES 32G X 4 MM MISC
22.0000 [IU] | Freq: Two times a day (BID) | 2 refills | Status: AC
Start: 2023-11-18 — End: ?
  Filled 2023-11-18: qty 100, 50d supply, fill #0
  Filled 2024-04-22: qty 100, 50d supply, fill #1

## 2023-11-19 ENCOUNTER — Other Ambulatory Visit: Payer: Self-pay

## 2023-11-20 ENCOUNTER — Other Ambulatory Visit: Payer: Self-pay

## 2023-12-02 ENCOUNTER — Other Ambulatory Visit: Payer: Self-pay | Admitting: Cardiology

## 2023-12-02 ENCOUNTER — Other Ambulatory Visit: Payer: Self-pay

## 2023-12-02 DIAGNOSIS — I5020 Unspecified systolic (congestive) heart failure: Secondary | ICD-10-CM

## 2023-12-02 MED ORDER — TORSEMIDE 20 MG PO TABS
20.0000 mg | ORAL_TABLET | Freq: Two times a day (BID) | ORAL | 0 refills | Status: DC
Start: 2023-12-02 — End: 2023-12-19
  Filled 2023-12-02: qty 60, 30d supply, fill #0
  Filled 2023-12-02: qty 180, 90d supply, fill #0

## 2023-12-02 NOTE — Telephone Encounter (Signed)
 Hi, would Dr. Lalla Brothers like to refill this medication for this patient?

## 2023-12-03 ENCOUNTER — Other Ambulatory Visit: Payer: Self-pay

## 2023-12-04 ENCOUNTER — Ambulatory Visit (HOSPITAL_COMMUNITY): Payer: Medicare Other

## 2023-12-04 ENCOUNTER — Other Ambulatory Visit: Payer: Self-pay

## 2023-12-16 ENCOUNTER — Other Ambulatory Visit: Payer: Self-pay | Admitting: Primary Care

## 2023-12-16 ENCOUNTER — Other Ambulatory Visit: Payer: Self-pay

## 2023-12-16 DIAGNOSIS — I25118 Atherosclerotic heart disease of native coronary artery with other forms of angina pectoris: Secondary | ICD-10-CM

## 2023-12-17 ENCOUNTER — Other Ambulatory Visit: Payer: Self-pay

## 2023-12-18 NOTE — Progress Notes (Unsigned)
  Cardiology Office Note:  .   Date:  12/18/2023  ID:  Daniel [name], DOB 1956-09-15, MRN [id number] PCP: Grayce Sessions, NP  [facility] Providers Cardiologist:  Truett Mainland, MD PCP: Grayce Sessions, NP  No chief complaint on file.     History of Present Illness: .    Daniel [name] is a 68 y.o. male with hypertension, asthma, uncontrolled type 2 diabetes mellitus, nonobstructive CAD, h/o acute myocarditis complicated by unstable VT (09/2021), NICM  There were no vitals filed for this visit.   ROS: *** ROS   Studies Reviewed: .        *** Independently interpreted ***/202***: Chol ***, TG ***, HDL ***, LDL *** HbA1C ***% Hb *** Cr *** ***   Echocardiogram 12/22/2021: Left ventricle cavity is normal in size. Mild concentric hypertrophy of the left ventricle. Normal LV systolic function with EF 54%. Normal global wall motion. Doppler evidence of grade I (impaired) diastolic dysfunction, normal LAP. 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 Mildly dilated ascending aorta at 4.3 cm. Left atrial cavity is mildly dilated. Mild tricuspid regurgitation. No evidence of pulmonary hypertension. Compared to previous study on 10/03/2021, LVEF has improved from 35%. Wall motion abnormalities have resolved.  Risk Assessment/Calculations:   {Does this patient have ATRIAL FIBRILLATION?[phone]}     Physical Exam:   Physical Exam   VISIT DIAGNOSES: No diagnosis found.   ASSESSMENT AND PLAN: .    Daniel [name] is a 68 y.o. male with hypertension, asthma, uncontrolled type 2 diabetes mellitus, nonobstructive CAD, h/o acute myocarditis complicated by unstable VT (09/2021), NICM  ***  {Are you ordering a CV Procedure (e.g. stress test, cath, DCCV, TEE, etc)?   Press F2        :[id number]}    No orders of the defined types were placed in this encounter.    F/u in ***  Signed, Elder Negus, MD

## 2023-12-19 ENCOUNTER — Encounter: Payer: Self-pay | Admitting: Cardiology

## 2023-12-19 ENCOUNTER — Ambulatory Visit: Attending: Cardiology | Admitting: Cardiology

## 2023-12-19 ENCOUNTER — Other Ambulatory Visit: Payer: Self-pay

## 2023-12-19 VITALS — BP 138/60 | HR 67 | Ht 72.0 in | Wt 254.4 lb

## 2023-12-19 DIAGNOSIS — I493 Ventricular premature depolarization: Secondary | ICD-10-CM | POA: Insufficient documentation

## 2023-12-19 DIAGNOSIS — I10 Essential (primary) hypertension: Secondary | ICD-10-CM | POA: Insufficient documentation

## 2023-12-19 DIAGNOSIS — I472 Ventricular tachycardia, unspecified: Secondary | ICD-10-CM | POA: Insufficient documentation

## 2023-12-19 DIAGNOSIS — I5020 Unspecified systolic (congestive) heart failure: Secondary | ICD-10-CM | POA: Insufficient documentation

## 2023-12-19 DIAGNOSIS — I25118 Atherosclerotic heart disease of native coronary artery with other forms of angina pectoris: Secondary | ICD-10-CM | POA: Diagnosis not present

## 2023-12-19 DIAGNOSIS — Z8679 Personal history of other diseases of the circulatory system: Secondary | ICD-10-CM | POA: Diagnosis present

## 2023-12-19 MED ORDER — ROSUVASTATIN CALCIUM 20 MG PO TABS
20.0000 mg | ORAL_TABLET | Freq: Every day | ORAL | 3 refills | Status: AC
Start: 2023-12-19 — End: ?
  Filled 2023-12-19 – 2024-04-22 (×2): qty 90, 90d supply, fill #0
  Filled 2024-09-25 (×2): qty 90, 90d supply, fill #1

## 2023-12-19 MED ORDER — AMIODARONE HCL 200 MG PO TABS
200.0000 mg | ORAL_TABLET | Freq: Every day | ORAL | 3 refills | Status: AC
  Filled 2023-12-19: qty 90, 90d supply, fill #0
  Filled 2024-06-04: qty 90, 90d supply, fill #1
  Filled 2024-10-09: qty 30, 30d supply, fill #2

## 2023-12-19 MED ORDER — METOPROLOL SUCCINATE ER 25 MG PO TB24
25.0000 mg | ORAL_TABLET | Freq: Every day | ORAL | 3 refills | Status: AC
  Filled 2023-12-19 – 2024-01-27 (×2): qty 90, 90d supply, fill #0
  Filled 2024-06-04: qty 90, 90d supply, fill #1
  Filled 2024-10-09 – 2024-11-06 (×2): qty 90, 90d supply, fill #2

## 2023-12-19 MED ORDER — ROSUVASTATIN CALCIUM 20 MG PO TABS
20.0000 mg | ORAL_TABLET | Freq: Every day | ORAL | 3 refills | Status: DC
Start: 2023-12-19 — End: 2023-12-19
  Filled 2023-12-19: qty 90, 90d supply, fill #0

## 2023-12-19 MED ORDER — TORSEMIDE 20 MG PO TABS
20.0000 mg | ORAL_TABLET | Freq: Two times a day (BID) | ORAL | 2 refills | Status: AC
Start: 2023-12-19 — End: ?
  Filled 2023-12-19 – 2024-01-01 (×2): qty 180, 90d supply, fill #0
  Filled 2024-06-04 – 2024-06-05 (×2): qty 180, 90d supply, fill #1
  Filled 2024-10-09: qty 60, 30d supply, fill #2

## 2023-12-19 NOTE — Patient Instructions (Signed)
 Medication Instructions:   Your physician recommends that you continue on your current medications as directed. Please refer to the Current Medication list given to you today.  *If you need a refill on your cardiac medications before your next appointment, please call your pharmacy*    Testing/Procedures:  Your physician has requested that you have an echocardiogram. Echocardiography is a painless test that uses sound waves to create images of your heart. It provides your doctor with information about the size and shape of your heart and how well your heart's chambers and valves are working. This procedure takes approximately one hour. There are no restrictions for this procedure. Please do NOT wear cologne, perfume, aftershave, or lotions (deodorant is allowed). Please arrive 15 minutes prior to your appointment time.  Please note: We ask at that you not bring children with you during ultrasound (echo/ vascular) testing. Due to room size and safety concerns, children are not allowed in the ultrasound rooms during exams. Our front office staff cannot provide observation of children in our lobby area while testing is being conducted. An adult accompanying a patient to their appointment will only be allowed in the ultrasound room at the discretion of the ultrasound technician under special circumstances. We apologize for any inconvenience.    Follow-Up: At [facility], you and your health needs are our priority.  As part of our continuing mission to provide you with exceptional heart care, we have created designated Provider Care Teams.  These Care Teams include your primary Cardiologist (physician) and Advanced Practice Providers (APPs -  Physician Assistants and Nurse Practitioners) who all work together to provide you with the care you need, when you need it.  We recommend signing up for the patient portal called "MyChart".  Sign up information is provided on this After Visit Summary.   MyChart is used to connect with patients for Virtual Visits (Telemedicine).  Patients are able to view lab/test results, encounter notes, upcoming appointments, etc.  Non-urgent messages can be sent to your provider as well.   To learn more about what you can do with MyChart, go to [email or web address].    Your next appointment:   6 month(s)  Provider:   Dr. Rosemary Holms   Other Instructions    1st Floor: - Lobby - Registration  - Pharmacy  - Lab - Cafe  2nd Floor: - PV Lab - Diagnostic Testing (echo, CT, nuclear med)  3rd Floor: - Vacant  4th Floor: - TCTS (cardiothoracic surgery) - AFib Clinic - Structural Heart Clinic - Vascular Surgery  - Vascular Ultrasound  5th Floor: - HeartCare Cardiology (general and EP) - Clinical Pharmacy for coumadin, hypertension, lipid, weight-loss medications, and med management appointments    Valet parking services will be available as well.

## 2023-12-24 ENCOUNTER — Other Ambulatory Visit: Payer: Self-pay

## 2024-01-01 ENCOUNTER — Other Ambulatory Visit: Payer: Self-pay

## 2024-01-15 ENCOUNTER — Ambulatory Visit (HOSPITAL_COMMUNITY): Attending: Cardiovascular Disease

## 2024-01-15 DIAGNOSIS — I5020 Unspecified systolic (congestive) heart failure: Secondary | ICD-10-CM | POA: Diagnosis present

## 2024-01-15 LAB — ECHOCARDIOGRAM COMPLETE
Area-P 1/2: 2.86 cm2
S' Lateral: 3.5 cm

## 2024-01-17 ENCOUNTER — Other Ambulatory Visit: Payer: Self-pay

## 2024-01-17 NOTE — Progress Notes (Signed)
 Aorta size and heart function are stable and looking good.  Repeat echocardiogram in 1 year.  Thanks MJP

## 2024-01-20 ENCOUNTER — Other Ambulatory Visit: Payer: Self-pay

## 2024-01-27 ENCOUNTER — Other Ambulatory Visit: Payer: Self-pay

## 2024-01-27 ENCOUNTER — Ambulatory Visit (INDEPENDENT_AMBULATORY_CARE_PROVIDER_SITE_OTHER): Payer: Medicare Other | Admitting: Primary Care

## 2024-01-27 ENCOUNTER — Encounter (INDEPENDENT_AMBULATORY_CARE_PROVIDER_SITE_OTHER): Payer: Self-pay | Admitting: Primary Care

## 2024-01-27 VITALS — BP 126/83 | HR 68 | Resp 16 | Wt 249.8 lb

## 2024-01-27 DIAGNOSIS — I10 Essential (primary) hypertension: Secondary | ICD-10-CM

## 2024-01-27 DIAGNOSIS — E118 Type 2 diabetes mellitus with unspecified complications: Secondary | ICD-10-CM | POA: Diagnosis not present

## 2024-01-27 LAB — POCT GLYCOSYLATED HEMOGLOBIN (HGB A1C): HbA1c, POC (controlled diabetic range): 6.8 % (ref 0.0–7.0)

## 2024-01-27 NOTE — Progress Notes (Signed)
 [facility]  Daniel [name], is a 68 y.o. male  [id number]  [id number]  DOB - 09-23-1956  Chief Complaint  Patient presents with   Diabetes   Hypertension       Subjective:   Daniel [name] is a 68 y.o. male here today for a follow up visit.  Management of type 2 diabetes today his A1c is 6.8.  Denies polyuria, polydipsia, polyphasia or vision changes.  Does not check blood sugars at home. HTN- patient has No headache, No chest pain, No abdominal pain - No Nausea, No new weakness tingling or numbness, No Cough - shortness of breath.  Excited  please and proud Diabetes  Hypertension    No problems updated.  Comprehensive ROS Pertinent positive and negative noted in HPI   Allergies  Allergen Reactions   Metformin  And Related Nausea Only    Past Medical History:  Diagnosis Date   Asthma    Diabetes mellitus without complication (HCC)    Hypertension    Myocarditis (HCC)     Current Outpatient Medications on File Prior to Visit  Medication Sig Dispense Refill   amiodarone  (PACERONE ) 200 MG tablet Take 1 tablet (200 mg total) by mouth daily. 90 tablet 3   aspirin  81 MG EC tablet Take 1 tablet (81 mg total) by mouth daily. 90 tablet 3   ELDERBERRY PO Take 2 tablets by mouth daily. gummy     insulin  aspart protamine - aspart (NOVOLOG  MIX 70/30 FLEXPEN) (70-30) 100 UNIT/ML FlexPen Inject 20 Units into the skin daily with breakfast. 3 mL 4   Insulin  Pen Needle (TECHLITE PLUS PEN NEEDLES) 32G X 4 MM MISC Use to inject insulin  twice daily. 100 each 2   metoprolol  succinate (TOPROL -XL) 25 MG 24 hr tablet Take 1 tablet (25 mg total) by mouth daily. 90 tablet 3   mexiletine (MEXITIL ) 150 MG capsule Take 1 capsule (150 mg total) by mouth 3 (three) times daily. 270 capsule 3   Multiple Vitamin (MULTIVITAMIN WITH MINERALS) TABS tablet Take 1 tablet by mouth daily.     Omega-3 Fatty Acids (FISH OIL PO) Take by mouth. daily     OVER THE COUNTER MEDICATION OTC  Potassium "patient unsure of dosage"     rosuvastatin  (CRESTOR ) 20 MG tablet Take 1 tablet (20 mg total) by mouth daily. 90 tablet 3   Semaglutide ,0.25 or 0.5MG /DOS, (OZEMPIC , 0.25 OR 0.5 MG/DOSE,) 2 MG/3ML SOPN Inject 0.5 mg into the skin once a week. 9 mL 1   torsemide  (DEMADEX ) 20 MG tablet Take 1 tablet (20 mg total) by mouth 2 (two) times daily. 180 tablet 2   No current facility-administered medications on file prior to visit.   Health Maintenance  Topic Date Due   COVID-19 Vaccine (1) Never done   Eye exam for diabetics  Never done   HIV Screening  Never done   Zoster (Shingles) Vaccine (1 of 2) Never done   Colon Cancer Screening  Never done   Yearly kidney health urinalysis for diabetes  10/27/2023   Pneumonia Vaccine (1 of 2 - PCV) 04/01/2024*   DTaP/Tdap/Td vaccine (1 - Tdap) 07/24/2024*   Complete foot exam   04/01/2024   Medicare Annual Wellness Visit  04/21/2024   Flu Shot  05/08/2024   Hemoglobin A1C  07/28/2024   Yearly kidney function blood test for diabetes  10/20/2024   Hepatitis C Screening  Completed   HPV Vaccine  Aged Out   Meningitis B Vaccine  Aged Out  *Topic  was postponed. The date shown is not the original due date.    Objective:   Vitals:   01/27/24 1601  BP: 126/83  Pulse: 68  Resp: 16  SpO2: 98%  Weight: 249 lb 12.8 oz (113.3 kg)   BP Readings from Last 3 Encounters:  01/27/24 126/83  12/19/23 138/60  10/25/23 (!) 145/87      Physical Exam Vitals reviewed.  Constitutional:      Appearance: He is obese.  HENT:     Head: Normocephalic.     Right Ear: Tympanic membrane and external ear normal.     Left Ear: Tympanic membrane and external ear normal.     Nose: Nose normal.  Eyes:     Extraocular Movements: Extraocular movements intact.     Pupils: Pupils are equal, round, and reactive to light.  Cardiovascular:     Rate and Rhythm: Normal rate and regular rhythm.  Pulmonary:     Effort: Pulmonary effort is normal.     Breath  sounds: Normal breath sounds.  Abdominal:     General: Bowel sounds are normal. There is distension.     Palpations: Abdomen is soft.  Musculoskeletal:        General: Normal range of motion.  Skin:    General: Skin is warm and dry.  Neurological:     Mental Status: He is oriented to person, place, and time.  Psychiatric:        Mood and Affect: Mood normal.        Behavior: Behavior normal.        Thought Content: Thought content normal.        Judgment: Judgment normal.     Assessment & Plan  Daniel [name] was seen today for diabetes and hypertension.  Diagnoses and all orders for this visit:  Type 2 diabetes mellitus with complication, with long-term current use of insulin  (HCC) -     POCT glycosylated hemoglobin (Hb A1C) 6.8  - educated on lifestyle modifications, including but not limited to diet choices and adding exercise to daily routine.    Essential hypertension Well-controlled managed by cardiology  Patient have been counseled extensively about nutrition and exercise. Other issues discussed during this visit include: low cholesterol diet, weight control and daily exercise, foot care, annual eye examinations at Ophthalmology, importance of adherence with medications and regular follow-up. We also discussed long term complications of uncontrolled diabetes and hypertension.   Return in about 6 months (around 07/28/2024).  The patient was given clear instructions to go to ER or return to medical center if symptoms don't improve, worsen or new problems develop. The patient verbalized understanding. The patient was told to call to get lab results if they haven't heard anything in the next week.   This note has been created with Education officer, environmental. Any transcriptional errors are unintentional.   Marius Siemens, NP 01/27/2024, 4:14 PM

## 2024-02-11 ENCOUNTER — Other Ambulatory Visit: Payer: Self-pay

## 2024-02-25 ENCOUNTER — Other Ambulatory Visit: Payer: Self-pay

## 2024-02-25 ENCOUNTER — Other Ambulatory Visit (INDEPENDENT_AMBULATORY_CARE_PROVIDER_SITE_OTHER): Payer: Self-pay | Admitting: Primary Care

## 2024-02-25 DIAGNOSIS — Z760 Encounter for issue of repeat prescription: Secondary | ICD-10-CM

## 2024-02-25 DIAGNOSIS — E118 Type 2 diabetes mellitus with unspecified complications: Secondary | ICD-10-CM

## 2024-02-26 ENCOUNTER — Other Ambulatory Visit (INDEPENDENT_AMBULATORY_CARE_PROVIDER_SITE_OTHER): Payer: Self-pay | Admitting: Primary Care

## 2024-02-26 ENCOUNTER — Other Ambulatory Visit: Payer: Self-pay

## 2024-02-26 DIAGNOSIS — Z760 Encounter for issue of repeat prescription: Secondary | ICD-10-CM

## 2024-02-26 DIAGNOSIS — E118 Type 2 diabetes mellitus with unspecified complications: Secondary | ICD-10-CM

## 2024-02-27 ENCOUNTER — Other Ambulatory Visit: Payer: Self-pay

## 2024-02-27 MED ORDER — NOVOLOG MIX 70/30 FLEXPEN (70-30) 100 UNIT/ML ~~LOC~~ SUPN
20.0000 [IU] | PEN_INJECTOR | Freq: Every day | SUBCUTANEOUS | 4 refills | Status: DC
  Filled 2024-02-27: qty 15, 75d supply, fill #0

## 2024-02-27 NOTE — Telephone Encounter (Signed)
 Requested by interface surescripts. Refills remain.  Requested Prescriptions  Pending Prescriptions Disp Refills   insulin  aspart protamine - aspart (NOVOLOG  MIX 70/30 FLEXPEN) (70-30) 100 UNIT/ML FlexPen 3 mL 4    Sig: Inject 20 Units into the skin daily with breakfast.     Endocrinology:  Diabetes - Insulins Passed - 02/27/2024 11:07 AM      Passed - HBA1C is between 0 and 7.9 and within 180 days    HbA1c, POC (controlled diabetic range)  Date Value Ref Range Status  01/27/2024 6.8 0.0 - 7.0 % Final         Passed - Valid encounter within last 6 months    Recent Outpatient Visits           1 month ago Type 2 diabetes mellitus with complication, with long-term current use of insulin  (HCC)   [place] [facility] Marius Siemens, NP   4 months ago Type 2 diabetes mellitus with complication, with long-term current use of insulin  (HCC)   [place] [facility] Marius Siemens, NP   7 months ago Type 2 diabetes mellitus with complication, with long-term current use of insulin  [facility])   [place] [facility] Marius Siemens, NP   10 months ago Encounter for [employer] annual wellness exam   [place] [facility] Marius Siemens, NP   11 months ago Type 2 diabetes mellitus with complication, with long-term current use of insulin  [facility])   [place] [facility] Marius Siemens, NP

## 2024-03-12 ENCOUNTER — Other Ambulatory Visit: Payer: Self-pay

## 2024-04-22 ENCOUNTER — Other Ambulatory Visit: Payer: Self-pay

## 2024-05-04 ENCOUNTER — Other Ambulatory Visit: Payer: Self-pay

## 2024-06-04 ENCOUNTER — Other Ambulatory Visit: Payer: Self-pay

## 2024-06-05 ENCOUNTER — Other Ambulatory Visit: Payer: Self-pay

## 2024-06-12 ENCOUNTER — Other Ambulatory Visit (INDEPENDENT_AMBULATORY_CARE_PROVIDER_SITE_OTHER): Payer: Self-pay | Admitting: Primary Care

## 2024-06-12 ENCOUNTER — Other Ambulatory Visit: Payer: Self-pay

## 2024-06-12 DIAGNOSIS — Z760 Encounter for issue of repeat prescription: Secondary | ICD-10-CM

## 2024-06-12 DIAGNOSIS — Z794 Long term (current) use of insulin: Secondary | ICD-10-CM

## 2024-06-12 MED ORDER — OZEMPIC (0.25 OR 0.5 MG/DOSE) 2 MG/3ML ~~LOC~~ SOPN
0.5000 mg | PEN_INJECTOR | SUBCUTANEOUS | 1 refills | Status: AC
  Filled 2024-06-12: qty 3, 28d supply, fill #0
  Filled 2024-07-14: qty 3, 28d supply, fill #1
  Filled 2024-08-27: qty 3, 28d supply, fill #2
  Filled 2024-09-25: qty 3, 28d supply, fill #3

## 2024-06-15 ENCOUNTER — Other Ambulatory Visit: Payer: Self-pay

## 2024-06-23 ENCOUNTER — Other Ambulatory Visit: Payer: Self-pay

## 2024-07-14 ENCOUNTER — Other Ambulatory Visit: Payer: Self-pay

## 2024-07-27 ENCOUNTER — Telehealth (INDEPENDENT_AMBULATORY_CARE_PROVIDER_SITE_OTHER): Payer: Self-pay | Admitting: Primary Care

## 2024-07-27 NOTE — Telephone Encounter (Signed)
 Called pt to reschedule appt. Pt did not answer and could not LVM for pt. Please advise.

## 2024-07-28 ENCOUNTER — Ambulatory Visit (INDEPENDENT_AMBULATORY_CARE_PROVIDER_SITE_OTHER): Admitting: Primary Care

## 2024-08-06 ENCOUNTER — Other Ambulatory Visit: Payer: Self-pay

## 2024-08-06 ENCOUNTER — Encounter (INDEPENDENT_AMBULATORY_CARE_PROVIDER_SITE_OTHER): Payer: Self-pay | Admitting: Primary Care

## 2024-08-06 ENCOUNTER — Ambulatory Visit: Attending: Primary Care

## 2024-08-06 ENCOUNTER — Ambulatory Visit (INDEPENDENT_AMBULATORY_CARE_PROVIDER_SITE_OTHER): Admitting: Primary Care

## 2024-08-06 VITALS — BP 156/88 | HR 44 | Resp 16 | Wt 229.6 lb

## 2024-08-06 DIAGNOSIS — Z794 Long term (current) use of insulin: Secondary | ICD-10-CM | POA: Diagnosis not present

## 2024-08-06 DIAGNOSIS — R300 Dysuria: Secondary | ICD-10-CM | POA: Diagnosis not present

## 2024-08-06 DIAGNOSIS — E118 Type 2 diabetes mellitus with unspecified complications: Secondary | ICD-10-CM | POA: Diagnosis not present

## 2024-08-06 DIAGNOSIS — I5020 Unspecified systolic (congestive) heart failure: Secondary | ICD-10-CM | POA: Diagnosis not present

## 2024-08-06 NOTE — Progress Notes (Signed)
 Subjective:  Patient ID: Daniel [name], male    DOB: Dec 27, 1955  Age: 68 y.o. MRN: [id number]  CC: Diabetes and Hypertension   Daniel [name] presents forFollow-up of diabetes. Patient does not check blood sugar at home.  Patient is followed by cardiology and has been taking mexiletine (MEXITIL ) 150 MG once daily was not aware that Daniel [name] had a 3 refills but feels fine now Daniel [name] will be picking up his medication and taking it correctly 3 times a day.  Covers up  HPI  Compliant with meds - Yes Checking CBGs? No  Fasting avg -   Postprandial average -  Exercising regularly? - Yes Watching carbohydrate intake? - Yes Neuropathy ? - No Hypoglycemic events - No  - Recovers with :   Pertinent ROS:  Polyuria - No Polydipsia - No Vision problems - No  Medications as noted below. Taking them regularly without complication/adverse reaction being reported today.   History Daniel [name] has a past medical history of Asthma, Diabetes mellitus without complication (HCC), Hypertension, and Myocarditis (HCC).   Daniel [name] has a past surgical history that includes LEFT HEART CATH AND CORONARY ANGIOGRAPHY (N/A, 10/02/2021).   His family history includes Cancer in his mother and paternal aunt; Heart attack in his father; Heart disease (age of onset: 42) in his father.Daniel [name] reports that Daniel [name] has never smoked. Daniel [name] has never been exposed to tobacco smoke. Daniel [name] has never used smokeless tobacco. Daniel [name] reports that Daniel [name] does not drink alcohol and does not use drugs.  Current Outpatient Medications on File Prior to Visit  Medication Sig Dispense Refill   amiodarone  (PACERONE ) 200 MG tablet Take 1 tablet (200 mg total) by mouth daily. 90 tablet 3   aspirin  81 MG EC tablet Take 1 tablet (81 mg total) by mouth daily. 90 tablet 3   ELDERBERRY PO Take 2 tablets by mouth daily. gummy     insulin  aspart protamine - aspart (NOVOLOG  MIX 70/30 FLEXPEN) (70-30) 100 UNIT/ML FlexPen Inject 20 Units into the skin daily with breakfast. 3 mL 4   Insulin   Pen Needle (TECHLITE PLUS PEN NEEDLES) 32G X 4 MM MISC Use to inject insulin  twice daily. 100 each 2   metoprolol  succinate (TOPROL -XL) 25 MG 24 hr tablet Take 1 tablet (25 mg total) by mouth daily. 90 tablet 3   mexiletine (MEXITIL ) 150 MG capsule Take 1 capsule (150 mg total) by mouth 3 (three) times daily. 270 capsule 3   Multiple Vitamin (MULTIVITAMIN WITH MINERALS) TABS tablet Take 1 tablet by mouth daily.     Omega-3 Fatty Acids (FISH OIL PO) Take by mouth. daily     OVER THE COUNTER MEDICATION OTC Potassium patient unsure of dosage     rosuvastatin  (CRESTOR ) 20 MG tablet Take 1 tablet (20 mg total) by mouth daily. 90 tablet 3   Semaglutide ,0.25 or 0.5MG /DOS, (OZEMPIC , 0.25 OR 0.5 MG/DOSE,) 2 MG/3ML SOPN Inject 0.5 mg into the skin once a week. 9 mL 1   torsemide  (DEMADEX ) 20 MG tablet Take 1 tablet (20 mg total) by mouth 2 (two) times daily. 180 tablet 2   No current facility-administered medications on file prior to visit.    Review of Systems Comprehensive ROS Pertinent positive and negative noted in HPI   Objective:  BP (!) 156/88   Pulse (!) 44   Resp 16   Wt 229 lb 9.6 oz (104.1 kg)   SpO2 99%   BMI 31.14 kg/m   BP Readings from Last 3 Encounters:  08/06/24 (!) 156/88  01/27/24 126/83  12/19/23 138/60    Wt Readings from Last 3 Encounters:  08/06/24 229 lb 9.6 oz (104.1 kg)  01/27/24 249 lb 12.8 oz (113.3 kg)  12/19/23 254 lb 6.4 oz (115.4 kg)    Physical Exam Vitals reviewed.  Constitutional:      Appearance: Daniel [name] is obese.  HENT:     Head: Normocephalic.     Right Ear: Tympanic membrane, ear canal and external ear normal.     Left Ear: Tympanic membrane, ear canal and external ear normal.     Nose: Nose normal.  Eyes:     Extraocular Movements: Extraocular movements intact.     Pupils: Pupils are equal, round, and reactive to light.  Cardiovascular:     Rate and Rhythm: Normal rate and regular rhythm.     Pulses: Normal pulses.     Heart sounds:  Normal heart sounds.  Pulmonary:     Effort: Pulmonary effort is normal.     Breath sounds: Normal breath sounds.  Abdominal:     General: Bowel sounds are normal. There is distension.     Palpations: Abdomen is soft.  Musculoskeletal:        General: Normal range of motion.  Skin:    General: Skin is warm and dry.  Neurological:     Mental Status: Daniel [name] is alert and oriented to person, place, and time.  Psychiatric:        Mood and Affect: Mood normal.        Behavior: Behavior normal.        Thought Content: Thought content normal.        Judgment: Judgment normal.     Lab Results  Component Value Date   HGBA1C 6.8 01/27/2024   HGBA1C 9.1 (A) 10/25/2023   HGBA1C 7.0 07/25/2023    Lab Results  Component Value Date   WBC 7.4 06/02/2023   HGB 15.7 06/02/2023   HCT 47.1 06/02/2023   PLT 156 06/02/2023   GLUCOSE 237 (H) 10/21/2023   CHOL 108 10/26/2022   TRIG 112 10/26/2022   HDL 56 10/26/2022   LDLCALC 32 10/26/2022   ALT 15 10/21/2023   AST 13 10/21/2023   NA 139 10/21/2023   K 4.6 10/21/2023   CL 97 10/21/2023   CREATININE 1.16 10/21/2023   BUN 18 10/21/2023   CO2 29 10/21/2023   TSH 1.370 10/21/2023   INR 1.0 10/02/2021   HGBA1C 6.8 01/27/2024    Title   Diabetic Foot Exam - detailed    Semmes-Weinstein Monofilament Test + means has sensation and - means no sensation      Image components are not supported.   Image components are not supported. Image components are not supported.  [place] Comments      Assessment & Plan:  Daniel [name] was seen today for diabetes and hypertension.  Diagnoses and all orders for this visit:  Type 2 diabetes mellitus with complication, with long-term current use of insulin  (HCC) - educated on lifestyle modifications, including but not limited to diet choices and adding exercise to daily routine.   -     CBC with Differential/Platelet -     CMP14+EGFR -     Hemoglobin A1c -     Lipid panel -      Microalbumin / creatinine urine ratio -     Ambulatory referral to Ophthalmology scheduled see AVs  HFrEF (heart failure with reduced ejection fraction) (HCC) -     CBC with Differential/Platelet -  CMP14+EGFR      Follow-up:  Return in about 6 months (around 02/04/2025) for fasting labs.  The above assessment and management plan was discussed with the patient. The patient verbalized understanding of and has agreed to the management plan. Patient is aware to call the clinic if symptoms fail to improve or worsen. Patient is aware when to return to the clinic for a follow-up visit. Patient educated on when it is appropriate to go to the emergency department.   Rosaline Bohr, NP-C

## 2024-08-06 NOTE — Patient Instructions (Signed)
 Scheduled diabetic retinopathy exam at [facility] care on November 18th 2025 at [address]  address  [address] [place], [place] phone [phone]

## 2024-08-07 LAB — CBC WITH DIFFERENTIAL/PLATELET
Basophils Absolute: 0 x10E3/uL (ref 0.0–0.2)
Basos: 0 %
EOS (ABSOLUTE): 0.1 x10E3/uL (ref 0.0–0.4)
Eos: 2 %
Hematocrit: 45.4 % (ref 37.5–51.0)
Hemoglobin: 15.4 g/dL (ref 13.0–17.7)
Immature Grans (Abs): 0 x10E3/uL (ref 0.0–0.1)
Immature Granulocytes: 0 %
Lymphocytes Absolute: 2 x10E3/uL (ref 0.7–3.1)
Lymphs: 33 %
MCH: 29.2 pg (ref 26.6–33.0)
MCHC: 33.9 g/dL (ref 31.5–35.7)
MCV: 86 fL (ref 79–97)
Monocytes Absolute: 0.7 x10E3/uL (ref 0.1–0.9)
Monocytes: 11 %
Neutrophils Absolute: 3.3 x10E3/uL (ref 1.4–7.0)
Neutrophils: 54 %
Platelets: 180 x10E3/uL (ref 150–450)
RBC: 5.27 x10E6/uL (ref 4.14–5.80)
RDW: 12.8 % (ref 11.6–15.4)
WBC: 6.1 x10E3/uL (ref 3.4–10.8)

## 2024-08-07 LAB — LIPID PANEL
Chol/HDL Ratio: 2.6 ratio (ref 0.0–5.0)
Cholesterol, Total: 127 mg/dL (ref 100–199)
HDL: 49 mg/dL (ref >39)
LDL Chol Calc (NIH): 52 mg/dL (ref 0–99)
Triglycerides: 154 mg/dL — ABNORMAL HIGH (ref 0–149)
VLDL Cholesterol Cal: 26 mg/dL (ref 5–40)

## 2024-08-07 LAB — CMP14+EGFR
ALT: 16 IU/L (ref 0–44)
AST: 15 IU/L (ref 0–40)
Albumin: 4.3 g/dL (ref 3.9–4.9)
Alkaline Phosphatase: 83 IU/L (ref 47–123)
BUN/Creatinine Ratio: 14 (ref 10–24)
BUN: 16 mg/dL (ref 8–27)
Bilirubin Total: 0.6 mg/dL (ref 0.0–1.2)
CO2: 25 mmol/L (ref 20–29)
Calcium: 9.7 mg/dL (ref 8.6–10.2)
Chloride: 101 mmol/L (ref 96–106)
Creatinine, Ser: 1.17 mg/dL (ref 0.76–1.27)
Globulin, Total: 2.7 g/dL (ref 1.5–4.5)
Glucose: 123 mg/dL — ABNORMAL HIGH (ref 70–99)
Potassium: 3.9 mmol/L (ref 3.5–5.2)
Sodium: 144 mmol/L (ref 134–144)
Total Protein: 7 g/dL (ref 6.0–8.5)
eGFR: 69 mL/min/1.73 (ref >59)

## 2024-08-07 LAB — HEMOGLOBIN A1C
Est. average glucose Bld gHb Est-mCnc: 151 mg/dL
Hgb A1c MFr Bld: 6.9 % — ABNORMAL HIGH (ref 4.8–5.6)

## 2024-08-07 NOTE — Addendum Note (Signed)
 Addended by: CASIMIR KRABBE R on: 08/07/2024 10:50 AM   Modules accepted: Orders

## 2024-08-08 LAB — LIPID PANEL

## 2024-08-08 LAB — MICROALBUMIN / CREATININE URINE RATIO
Creatinine, Urine: 402.1 mg/dL
Microalb/Creat Ratio: 7 mg/g{creat} (ref 0–29)
Microalbumin, Urine: 26.2 ug/mL

## 2024-08-08 LAB — URINALYSIS, COMPLETE
Glucose, UA: NEGATIVE
Ketones, UA: NEGATIVE
Leukocytes,UA: NEGATIVE
Nitrite, UA: NEGATIVE
RBC, UA: NEGATIVE
Specific Gravity, UA: 1.028 (ref 1.005–1.030)
Urobilinogen, Ur: 1 mg/dL (ref 0.2–1.0)
pH, UA: 5 (ref 5.0–7.5)

## 2024-08-08 LAB — MICROSCOPIC EXAMINATION
Bacteria, UA: NONE SEEN
RBC, Urine: NONE SEEN /HPF (ref 0–2)

## 2024-08-08 LAB — CMP14+EGFR

## 2024-08-08 LAB — HEMOGLOBIN A1C

## 2024-08-08 LAB — CBC WITH DIFFERENTIAL/PLATELET

## 2024-08-09 LAB — URINE CULTURE: Organism ID, Bacteria: NO GROWTH

## 2024-08-10 ENCOUNTER — Ambulatory Visit (INDEPENDENT_AMBULATORY_CARE_PROVIDER_SITE_OTHER): Payer: Self-pay | Admitting: Primary Care

## 2024-08-14 ENCOUNTER — Encounter (INDEPENDENT_AMBULATORY_CARE_PROVIDER_SITE_OTHER): Payer: Self-pay

## 2024-08-27 ENCOUNTER — Other Ambulatory Visit (INDEPENDENT_AMBULATORY_CARE_PROVIDER_SITE_OTHER): Payer: Self-pay | Admitting: Primary Care

## 2024-08-27 ENCOUNTER — Other Ambulatory Visit: Payer: Self-pay

## 2024-08-27 DIAGNOSIS — E118 Type 2 diabetes mellitus with unspecified complications: Secondary | ICD-10-CM

## 2024-08-27 DIAGNOSIS — Z760 Encounter for issue of repeat prescription: Secondary | ICD-10-CM

## 2024-08-28 ENCOUNTER — Other Ambulatory Visit: Payer: Self-pay

## 2024-08-28 MED ORDER — NOVOLOG MIX 70/30 FLEXPEN (70-30) 100 UNIT/ML ~~LOC~~ SUPN
20.0000 [IU] | PEN_INJECTOR | Freq: Every day | SUBCUTANEOUS | 4 refills | Status: AC
  Filled 2024-08-28: qty 3, 15d supply, fill #0

## 2024-09-25 ENCOUNTER — Other Ambulatory Visit: Payer: Self-pay

## 2024-10-09 ENCOUNTER — Other Ambulatory Visit: Payer: Self-pay

## 2024-10-21 ENCOUNTER — Other Ambulatory Visit: Payer: Self-pay

## 2024-10-27 ENCOUNTER — Other Ambulatory Visit: Payer: Self-pay

## 2024-11-06 ENCOUNTER — Other Ambulatory Visit: Payer: Self-pay
